# Patient Record
Sex: Female | Born: 1965 | Race: White | Hispanic: No | Marital: Married | Smoking: Never smoker
Health system: Southern US, Community
[De-identification: ages and names within clinical notes are randomized; demographics above are authoritative.]

## PROBLEM LIST (undated history)

## (undated) DIAGNOSIS — G9332 Myalgic encephalomyelitis/chronic fatigue syndrome: Secondary | ICD-10-CM

## (undated) DIAGNOSIS — N289 Disorder of kidney and ureter, unspecified: Secondary | ICD-10-CM

## (undated) DIAGNOSIS — G43909 Migraine, unspecified, not intractable, without status migrainosus: Secondary | ICD-10-CM

## (undated) DIAGNOSIS — R002 Palpitations: Secondary | ICD-10-CM

## (undated) DIAGNOSIS — R5382 Chronic fatigue, unspecified: Secondary | ICD-10-CM

## (undated) DIAGNOSIS — I10 Essential (primary) hypertension: Secondary | ICD-10-CM

## (undated) HISTORY — DX: Migraine, unspecified, not intractable, without status migrainosus: G43.909

## (undated) HISTORY — DX: Palpitations: R00.2

## (undated) HISTORY — DX: Disorder of kidney and ureter, unspecified: N28.9

## (undated) HISTORY — PX: TUBAL LIGATION: SHX77

## (undated) HISTORY — PX: OTHER SURGICAL HISTORY: SHX169

## (undated) HISTORY — DX: Chronic fatigue, unspecified: R53.82

## (undated) HISTORY — DX: Essential (primary) hypertension: I10

## (undated) HISTORY — DX: Myalgic encephalomyelitis/chronic fatigue syndrome: G93.32

---

## 2005-01-09 ENCOUNTER — Ambulatory Visit: Payer: Self-pay | Admitting: Family Medicine

## 2005-01-12 ENCOUNTER — Ambulatory Visit: Payer: Self-pay | Admitting: Family Medicine

## 2005-01-31 ENCOUNTER — Ambulatory Visit: Payer: Self-pay | Admitting: Family Medicine

## 2005-03-01 ENCOUNTER — Ambulatory Visit: Payer: Self-pay | Admitting: Family Medicine

## 2005-03-21 ENCOUNTER — Ambulatory Visit: Payer: Self-pay | Admitting: Family Medicine

## 2005-04-12 ENCOUNTER — Ambulatory Visit: Payer: Self-pay | Admitting: Family Medicine

## 2005-05-25 ENCOUNTER — Encounter: Payer: Self-pay | Admitting: Family Medicine

## 2005-06-12 ENCOUNTER — Ambulatory Visit: Payer: Self-pay | Admitting: Family Medicine

## 2005-06-12 ENCOUNTER — Other Ambulatory Visit: Admission: RE | Admit: 2005-06-12 | Discharge: 2005-06-12 | Payer: Self-pay | Admitting: Family Medicine

## 2005-06-12 ENCOUNTER — Encounter: Payer: Self-pay | Admitting: Family Medicine

## 2005-06-12 LAB — CONVERTED CEMR LAB: Pap Smear: NORMAL

## 2005-08-28 ENCOUNTER — Ambulatory Visit: Payer: Self-pay | Admitting: Family Medicine

## 2005-11-30 ENCOUNTER — Ambulatory Visit: Payer: Self-pay | Admitting: Family Medicine

## 2006-01-02 DIAGNOSIS — K589 Irritable bowel syndrome without diarrhea: Secondary | ICD-10-CM

## 2006-01-02 DIAGNOSIS — G4700 Insomnia, unspecified: Secondary | ICD-10-CM

## 2006-01-02 DIAGNOSIS — F339 Major depressive disorder, recurrent, unspecified: Secondary | ICD-10-CM | POA: Insufficient documentation

## 2006-01-02 DIAGNOSIS — G43909 Migraine, unspecified, not intractable, without status migrainosus: Secondary | ICD-10-CM

## 2006-02-06 ENCOUNTER — Encounter: Payer: Self-pay | Admitting: Family Medicine

## 2007-03-13 ENCOUNTER — Ambulatory Visit: Payer: Self-pay | Admitting: Family Medicine

## 2007-03-13 DIAGNOSIS — R259 Unspecified abnormal involuntary movements: Secondary | ICD-10-CM | POA: Insufficient documentation

## 2007-06-18 ENCOUNTER — Ambulatory Visit: Payer: Self-pay | Admitting: Family Medicine

## 2007-09-05 ENCOUNTER — Ambulatory Visit: Payer: Self-pay | Admitting: Family Medicine

## 2007-09-05 DIAGNOSIS — N920 Excessive and frequent menstruation with regular cycle: Secondary | ICD-10-CM

## 2008-01-10 ENCOUNTER — Ambulatory Visit: Payer: Self-pay | Admitting: Family Medicine

## 2008-04-16 ENCOUNTER — Telehealth: Payer: Self-pay | Admitting: Family Medicine

## 2008-11-13 ENCOUNTER — Ambulatory Visit: Payer: Self-pay | Admitting: Family Medicine

## 2008-11-13 DIAGNOSIS — R55 Syncope and collapse: Secondary | ICD-10-CM | POA: Insufficient documentation

## 2008-11-16 LAB — CONVERTED CEMR LAB
ALT: 16 units/L (ref 0–35)
Alkaline Phosphatase: 52 units/L (ref 39–117)
Calcium: 9.6 mg/dL (ref 8.4–10.5)
HCT: 40.5 % (ref 36.0–46.0)
HDL: 56 mg/dL (ref >39)
Iron: 116 ug/dL (ref 42–145)
LDL Cholesterol: 145 mg/dL — ABNORMAL HIGH (ref 0–99)
MCV: 95.5 fL (ref 78.0–100.0)
Potassium: 4.6 meq/L (ref 3.5–5.3)
RBC: 4.24 M/uL (ref 3.87–5.11)
Saturation Ratios: 32 % (ref 20–55)
TIBC: 368 ug/dL (ref 250–470)
Total Protein: 7.1 g/dL (ref 6.0–8.3)
Triglycerides: 97 mg/dL (ref <150)
UIBC: 252 ug/dL
VLDL: 19 mg/dL (ref 0–40)
WBC: 5.2 10*3/uL (ref 4.0–10.5)

## 2008-11-20 ENCOUNTER — Ambulatory Visit: Payer: Self-pay | Admitting: Family Medicine

## 2008-11-20 DIAGNOSIS — E162 Hypoglycemia, unspecified: Secondary | ICD-10-CM | POA: Insufficient documentation

## 2008-12-11 ENCOUNTER — Ambulatory Visit: Payer: Self-pay | Admitting: Family Medicine

## 2008-12-11 DIAGNOSIS — I10 Essential (primary) hypertension: Secondary | ICD-10-CM | POA: Insufficient documentation

## 2009-01-08 ENCOUNTER — Ambulatory Visit: Payer: Self-pay | Admitting: Family Medicine

## 2009-03-10 ENCOUNTER — Ambulatory Visit: Payer: Self-pay | Admitting: Family Medicine

## 2009-03-10 DIAGNOSIS — M25649 Stiffness of unspecified hand, not elsewhere classified: Secondary | ICD-10-CM

## 2009-08-30 ENCOUNTER — Telehealth: Payer: Self-pay | Admitting: Family Medicine

## 2009-08-31 ENCOUNTER — Ambulatory Visit: Payer: Self-pay | Admitting: Family Medicine

## 2009-08-31 DIAGNOSIS — R079 Chest pain, unspecified: Secondary | ICD-10-CM | POA: Insufficient documentation

## 2009-08-31 DIAGNOSIS — R5381 Other malaise: Secondary | ICD-10-CM

## 2009-08-31 DIAGNOSIS — R5383 Other fatigue: Secondary | ICD-10-CM

## 2009-09-01 ENCOUNTER — Encounter (INDEPENDENT_AMBULATORY_CARE_PROVIDER_SITE_OTHER): Payer: Self-pay | Admitting: *Deleted

## 2009-09-01 LAB — CONVERTED CEMR LAB
Glucose, Bld: 86 mg/dL (ref 70–99)
HCT: 37.3 % (ref 36.0–46.0)
Hemoglobin: 11.9 g/dL — ABNORMAL LOW (ref 12.0–15.0)
MCV: 95.4 fL (ref 78.0–100.0)
Platelets: 247 10*3/uL (ref 150–400)
Potassium: 4.4 meq/L (ref 3.5–5.3)
RBC: 3.91 M/uL (ref 3.87–5.11)
RDW: 13.5 % (ref 11.5–15.5)
Saturation Ratios: 19 % — ABNORMAL LOW (ref 20–55)
Troponin I: <0.01 ng/mL (ref <0.06)

## 2009-09-17 ENCOUNTER — Encounter: Payer: Self-pay | Admitting: Family Medicine

## 2009-09-22 ENCOUNTER — Ambulatory Visit: Payer: Self-pay | Admitting: Family Medicine

## 2009-09-22 DIAGNOSIS — D509 Iron deficiency anemia, unspecified: Secondary | ICD-10-CM

## 2009-09-22 DIAGNOSIS — R002 Palpitations: Secondary | ICD-10-CM | POA: Insufficient documentation

## 2010-01-14 ENCOUNTER — Ambulatory Visit: Payer: Self-pay | Admitting: Family Medicine

## 2010-03-15 ENCOUNTER — Other Ambulatory Visit
Admission: RE | Admit: 2010-03-15 | Discharge: 2010-03-15 | Payer: Self-pay | Source: Home / Self Care | Admitting: Family Medicine

## 2010-03-15 ENCOUNTER — Ambulatory Visit: Payer: Self-pay | Admitting: Family Medicine

## 2010-03-15 ENCOUNTER — Encounter: Payer: Self-pay | Admitting: Family Medicine

## 2010-03-15 LAB — CONVERTED CEMR LAB: Pap Smear: NORMAL

## 2010-03-16 LAB — CONVERTED CEMR LAB
Cholesterol: 214 mg/dL — ABNORMAL HIGH (ref 0–200)
HDL: 58 mg/dL (ref >39)
Total CHOL/HDL Ratio: 3.7
VLDL: 17 mg/dL (ref 0–40)

## 2010-03-17 LAB — CONVERTED CEMR LAB: Pap Smear: NEGATIVE

## 2010-03-23 ENCOUNTER — Encounter
Admission: RE | Admit: 2010-03-23 | Discharge: 2010-03-23 | Payer: Self-pay | Source: Home / Self Care | Attending: Family Medicine | Admitting: Family Medicine

## 2010-04-06 ENCOUNTER — Ambulatory Visit
Admission: RE | Admit: 2010-04-06 | Discharge: 2010-04-06 | Payer: Self-pay | Source: Home / Self Care | Attending: Family Medicine | Admitting: Family Medicine

## 2010-04-06 ENCOUNTER — Encounter
Admission: RE | Admit: 2010-04-06 | Discharge: 2010-04-06 | Payer: Self-pay | Source: Home / Self Care | Attending: Family Medicine | Admitting: Family Medicine

## 2010-04-06 DIAGNOSIS — R310 Gross hematuria: Secondary | ICD-10-CM | POA: Insufficient documentation

## 2010-04-06 DIAGNOSIS — B9789 Other viral agents as the cause of diseases classified elsewhere: Secondary | ICD-10-CM | POA: Insufficient documentation

## 2010-04-06 LAB — CONVERTED CEMR LAB
Bilirubin Urine: NEGATIVE
Glucose, Urine, Semiquant: NEGATIVE
Ketones, urine, test strip: NEGATIVE
pH: 5.5

## 2010-04-07 ENCOUNTER — Encounter: Payer: Self-pay | Admitting: Family Medicine

## 2010-04-11 ENCOUNTER — Encounter (INDEPENDENT_AMBULATORY_CARE_PROVIDER_SITE_OTHER): Payer: Self-pay | Admitting: *Deleted

## 2010-04-26 NOTE — Letter (Signed)
Summary: Consult Scheduled [facility]  [address]   [place], [place] [address]   Phone: [phone]  Fax: [phone]    09/01/2009 MRN: [id number]    Dear Ms. Morison,       We have scheduled an appointment for you.  At the recommendation of Dr.Bowen, we have scheduled you a consult with [facility] -Dr. Ruthann Cancer on 09/17/09 at 10:00.  Their phone number is [phone].  If this appointment day and time is not convenient for you, please feel free to call the office of the doctor you are being referred to at the number listed above and reschedule the appointment.     Thank you,  Michaelle Copas [facility] [name] [phone]

## 2010-04-26 NOTE — Progress Notes (Signed)
Summary: Chest discomfort  Phone Note Call from Patient   Caller: Patient Summary of Call: Pt c/o chest discomfort and some palpitations x 4 days but has experienced this before. Pt started w/ some pains in arms over the weekend but is better today. Pt scheduled tomorrow for 3:15 but agrees to go to ED if Sxs worsen.  Initial call taken by: Payton Spark CMA,  August 30, 2009 8:35 AM  Follow-up for Phone Call        OK. Follow-up by: Seymour Bars DO,  August 30, 2009 8:45 AM

## 2010-04-26 NOTE — Assessment & Plan Note (Signed)
Summary: f/u palpitations   Vital Signs:  Patient profile:   45 year old female Height:      65.25 inches Weight:      139 pounds BMI:     23.04 O2 Sat:      99 % on Room air Pulse rate:   63 / minute BP sitting:   117 / 75  (left arm) Cuff size:   regular  Vitals Entered By: Payton Spark CMA (September 22, 2009 10:24 AM)  O2 Flow:  Room air CC: F/U fatigue. Feeling better.    Primary Care Provider:  Seymour Bars DO  CC:  F/U fatigue. Feeling better. Marland Kitchen  History of Present Illness: 45 yo WF presents for f/u fatigue.  She had a visit with Dr Ruthann Cancer and had a normal stress test.  She is still having heart palpitations.  She is on Metoprolol which may help with her palpitations, taking 1 hr before bed.  No longer having pains.      She is still having fatigue.  She is trying to rest and does not feel depressed or anxious.  She is still having some low sugars even though she tries to eat q 3 hrs.    Current Medications (verified): 1)  Multivitamins  Tabs (Multiple Vitamin) .... Daily 2)  Relpax 20 Mg  Tabs (Eletriptan Hydrobromide) .Marland Kitchen.. 1 Tab By Mouth X 1 As Needed Migraine, Repeat in 2 Hr If Needed 3)  Promethazine Hcl 12.5 Mg  Supp (Promethazine Hcl) .Marland Kitchen.. 1 Pr Q 6 Hrs As Needed Nausea 4)  Metoprolol Succinate 25 Mg Xr24h-Tab (Metoprolol Succinate) .Marland Kitchen.. 1 Tab By Mouth Once Daily  Allergies (verified): No Known Drug Allergies  Review of Systems  The patient denies chest pain and dyspnea on exertion.    Physical Exam  General:  alert, well-developed, well-nourished, and well-hydrated.   Head:  normocephalic and atraumatic.   Mouth:  pharynx pink and moist.   Neck:  no masses.   Lungs:  Normal respiratory effort, chest expands symmetrically. Lungs are clear to auscultation, no crackles or wheezes. Heart:  sinus arrythmia with reg rate.  NO Murmur. Extremities:  no LE edema Neurologic:  slight resting hand tremor Skin:  color normal.   slight conjunctival pallor Psych:   good eye contact and flat affect.     Impression & Recommendations:  Problem # 1:  PALPITATIONS (ICD-785.1) She is reassured by normal stress test with Dr Ruthann Cancer.  She is still having palpitations at night w/o caffeine use and denies feeling anxious.  Will change her metoprolol to Inderal to see if this works better.  I will review an Echo if this was done to eval for MVP. Her updated medication list for this problem includes:    Inderal La 80 Mg Xr24h-cap (Propranolol hcl) .Marland Kitchen... 1 capsule by mouth qpm  Problem # 2:  ANEMIA, IRON DEFICIENCY (ICD-280.9) Secondary to menorrhagia. She is now on MVI with iron.  Recheck in 4 mos.  Problem # 3:  ESSENTIAL HYPERTENSION, BENIGN (ICD-401.1) BP has improved.   Her updated medication list for this problem includes:    Inderal La 80 Mg Xr24h-cap (Propranolol hcl) .Marland Kitchen... 1 capsule by mouth qpm  Problem # 4:  HYPOGLYCEMIA, UNSPECIFIED (ICD-251.2) Reminded pt to eat protein w/ each meal and snack. Recommend bring small pack of nuts in her purse to eat when she starts feeling bad.  Problem # 5:  FATIGUE (ICD-780.79) Previous dx of chronic fatigue syndrome and dpression. She is still having  chronic fatigue.  This should improve with adequate rest, regular exercise, healthy diet and MVI with iron. She is opposed to starting an anti- depresant.  Complete Medication List: 1)  Multivitamins Tabs (Multiple vitamin) .... Daily 2)  Relpax 20 Mg Tabs (Eletriptan hydrobromide) .Marland Kitchen.. 1 tab by mouth x 1 as needed migraine, repeat in 2 hr if needed 3)  Inderal La 80 Mg Xr24h-cap (Propranolol hcl) .Marland Kitchen.. 1 capsule by mouth qpm  Patient Instructions: 1)  Change Metoprolol to Inderal - take at night. 2)  Call if any problems. 3)  Eat protein with each meal and snack to prevent low sugars. 4)  Return for f/u chronic fatigue/ palpations in 4 mos. Prescriptions: INDERAL LA 80 MG XR24H-CAP (PROPRANOLOL HCL) 1 capsule by mouth qPM  #30 x 2   Entered and Authorized  by:   Seymour Bars DO   Signed by:   Seymour Bars DO on 09/22/2009   Method used:   Electronically to        [facility]  [address] (retail)       [address]       [place], [place]  [address]       Ph: [phone]       Fax: [phone]   RxID:   [id number]

## 2010-04-26 NOTE — Assessment & Plan Note (Signed)
Summary: f/u palpitations/ BP / migraines   Vital Signs:  Patient profile:   45 year old female Height:      65.25 inches Weight:      144 pounds Pulse rate:   64 / minute BP sitting:   109 / 66  (left arm) Cuff size:   regular  Vitals Entered By: Kathlene November LPN (January 14, 2010 8:08 AM) CC: follow-up BP Flu Vaccine Consent Questions     Do you have a history of severe allergic reactions to this vaccine? no    Any prior history of allergic reactions to egg and/or gelatin? no    Do you have a sensitivity to the preservative Thimersol? no    Do you have a past history of Guillan-Barre Syndrome? no    Do you currently have an acute febrile illness? no    Have you ever had a severe reaction to latex? no    Vaccine information given and explained to patient? yes    Are you currently pregnant? no    Lot Number:[id number]   Exp Date:09/24/2010   Site Given  Left Deltoid IM   Primary Care Provider:  Seymour Bars DO  CC:  follow-up BP.  History of Present Illness: 45 yo WF presents for f/u HTN and palpitations.  She was changed from Metoprolol to Propranolol 4 mos ago and it has worked better for her palpitations and migraines.  Fatigue improved some.  Trying to get back to exercise.  Rarely needing to take her Relpax.  Having heavy menses and had a mild iron def anemia at last lab check in June.  Current Medications (verified): 1)  Multivitamins  Tabs (Multiple Vitamin) .... Daily 2)  Relpax 20 Mg  Tabs (Eletriptan Hydrobromide) .Marland Kitchen.. 1 Tab By Mouth X 1 As Needed Migraine, Repeat in 2 Hr If Needed 3)  Inderal La 80 Mg Xr24h-Cap (Propranolol Hcl) .Marland Kitchen.. 1 Capsule By Mouth Qpm  Allergies (verified): No Known Drug Allergies  Comments:  Nurse/Medical Assistant: The patient's medications and allergies were reviewed with the patient and were updated in the Medication and Allergy Lists. Kathlene November LPN (January 14, 2010 8:08 AM)  Past History:  Past Medical History: chronic  fatigue syndrome  G2P2002  hand  tremor since 1999 migraines palpitations.  stress echo normal 08-2009, Dr Ruthann Cancer  Past Surgical History: Reviewed history from 02/06/2006 and no changes required. BTL  CT head normal IVP nml LTCS x2  Social History: Reviewed history from 03/13/2007 and no changes required. Married to [employer].  Has 2 teenage children.  Works at [employer] and likes her job.  Nonsmoker.  Denies ETOH or drugs.  Does not exercise. Works 40 hrs/wk.  Review of Systems      See HPI  Physical Exam  General:  alert, well-developed, well-nourished, and well-hydrated.   Head:  normocephalic and atraumatic.   Mouth:  good dentition and pharynx pink and moist.   Neck:  no masses.   Lungs:  Normal respiratory effort, chest expands symmetrically. Lungs are clear to auscultation, no crackles or wheezes. Heart:  Normal rate and regular rhythm. S1 and S2 normal without gallop, murmur, click, rub or other extra sounds. Extremities:  no LE edema Neurologic:  faint resting bilat hand tremor, unchanged Skin:  color normal.   Cervical Nodes:  No lymphadenopathy noted Psych:  good eye contact, not anxious appearing, and not depressed appearing.     Impression & Recommendations:  Problem # 1:  PALPITATIONS (ICD-785.1) Assessment Improved Improved  with change to Inderal with good BP and HR.   Her updated medication list for this problem includes:    Inderal La 80 Mg Xr24h-cap (Propranolol hcl) .Marland Kitchen... 1 capsule by mouth qpm  Problem # 2:  ESSENTIAL HYPERTENSION, BENIGN (ICD-401.1) Assessment: Improved BP looks great.  Continue current meds.  Labs UTD, just needs FLP at next OV. Her updated medication list for this problem includes:    Inderal La 80 Mg Xr24h-cap (Propranolol hcl) .Marland Kitchen... 1 capsule by mouth qpm  BP today: 109/66 Prior BP: 117/75 (09/22/2009)  Labs Reviewed: K+: 4.4 (08/31/2009) Creat: : 0.89 (08/31/2009)   Chol: 220 (11/13/2008)   HDL: 56 (11/13/2008)    LDL: 145 (11/13/2008)   TG: 97 (11/13/2008)  Problem # 3:  MIGRAINE, UNSPEC., W/O INTRACTABLE MIGRAINE (ICD-346.90) Assessment: Improved Improved with Inderall, saving RElpax for rescue. Her updated medication list for this problem includes:    Relpax 20 Mg Tabs (Eletriptan hydrobromide) .Marland Kitchen... 1 tab by mouth x 1 as needed migraine, repeat in 2 hr if needed    Inderal La 80 Mg Xr24h-cap (Propranolol hcl) .Marland Kitchen... 1 capsule by mouth qpm  Problem # 4:  ANEMIA, IRON DEFICIENCY (ICD-280.9) Secondary to heavy menses.  Overdue for pap.  Schedule in 3 mos. Taking a women's MVI.  Plan to hemoccult at time of pap.   Hgb: 11.9 (08/31/2009)   Hct: 37.3 (08/31/2009)   Platelets: 247 (08/31/2009) RBC: 3.91 (08/31/2009)   RDW: 13.5 (08/31/2009)   WBC: 6.8 (08/31/2009) MCV: 95.4 (08/31/2009)   MCHC: 31.9 (08/31/2009) Iron: 70 (08/31/2009)   TIBC: 359 (08/31/2009)   % Sat: 19 (08/31/2009) B12: 681 (08/31/2009)   TSH: 1.107 (08/31/2009)  Complete Medication List: 1)  Multivitamins Tabs (Multiple vitamin) .... Daily 2)  Relpax 20 Mg Tabs (Eletriptan hydrobromide) .Marland Kitchen.. 1 tab by mouth x 1 as needed migraine, repeat in 2 hr if needed 3)  Inderal La 80 Mg Xr24h-cap (Propranolol hcl) .Marland Kitchen.. 1 capsule by mouth qpm  Other Orders: Admin 1st Vaccine ([address]) Flu Vaccine 39yrs + ([address])  Patient Instructions: 1)  Stay on Inderal.  RFd for another 6 mos. 2)  BP looks great! 3)  Work on regular exercise. 4)  Flu shot today. 5)  Schedule PAP smear/ fasting cholesterol in 3 mos. Prescriptions: INDERAL LA 80 MG XR24H-CAP (PROPRANOLOL HCL) 1 capsule by mouth qPM  #30 Capsule x 6   Entered and Authorized by:   Seymour Bars DO   Signed by:   Seymour Bars DO on 01/14/2010   Method used:   Electronically to        [facility]  [address] (retail)       [address]       [place], [place]  [address]       Ph: [phone]       Fax: [phone]   RxID:   [id number]    Orders Added: 1)  Est. Patient Level IV  [[address]] 2)  Admin 1st Vaccine [90471] 3)  Flu Vaccine 52yrs + [[address]]

## 2010-04-26 NOTE — Consult Note (Signed)
Summary: [facility]  [facility]   Imported By: Lanelle Bal 09/29/2009 09:45:30  _____________________________________________________________________  External Attachment:    Type:   Image     Comment:   External Document

## 2010-04-26 NOTE — Assessment & Plan Note (Signed)
Summary: L sided CP   Vital Signs:  Patient profile:   45 year old female Height:      65.25 inches Weight:      142 pounds BMI:     23.53 O2 Sat:      100 % on Room air Pulse rate:   65 / minute BP sitting:   140 / 82  (left arm) Cuff size:   regular  Vitals Entered By: Payton Spark CMA (August 31, 2009 3:27 PM)  O2 Flow:  Room air CC: L sided chest discomfort and L arm tingling.    Primary Care Provider:  Seymour Bars DO  CC:  L sided chest discomfort and L arm tingling. Marland Kitchen  History of Present Illness: 45 yo WF presents for L sided chest pressure on and off over the last week or so.  It has occured with playing badmitton.  She also gets heart fluttering.  She notices them day and night.  She denies  feeling SOB.  She has chronic fatigue.  The L sided CP radiates to the L shoulder but is more of a tingling than a pain.  No c/o nausea or heartburn.  Denies diaphoresis.  Denies any acute stressors.    She tries to avoid caffeine.  She is a nonsmoker.     Current Medications (verified): 1)  Multivitamins  Tabs (Multiple Vitamin) .... Daily 2)  Relpax 20 Mg  Tabs (Eletriptan Hydrobromide) .Marland Kitchen.. 1 Tab By Mouth X 1 As Needed Migraine, Repeat in 2 Hr If Needed 3)  Promethazine Hcl 12.5 Mg  Supp (Promethazine Hcl) .Marland Kitchen.. 1 Pr Q 6 Hrs As Needed Nausea 4)  Metoprolol Succinate 25 Mg Xr24h-Tab (Metoprolol Succinate) .Marland Kitchen.. 1 Tab By Mouth Once Daily  Allergies (verified): No Known Drug Allergies  Past History:  Past Medical History: Reviewed history from 06/18/2007 and no changes required. chronic fatigue syndrome  G2P2002  hand  tremor since 1999 migraines  Past Surgical History: Reviewed history from 02/06/2006 and no changes required. BTL  CT head normal IVP nml LTCS x2  Family History: Reviewed history from 12/11/2008 and no changes required. 1 brother w/ heart dz, AMI 22s  7 siblings- sister with HTN  father alive- HTN, hx of rheumatic fever  mother died of  emphysema  Social History: Reviewed history from 03/13/2007 and no changes required. Married to [employer].  Has 2 teenage children.  Works at [employer] and likes her job.  Nonsmoker.  Denies ETOH or drugs.  Does not exercise. Works 40 hrs/wk.  Review of Systems      See HPI  Physical Exam  General:  alert, well-developed, well-nourished, and well-hydrated.   Head:  normocephalic and atraumatic.   Mouth:  good dentition and pharynx pink and moist.   Neck:  no masses.   Chest Wall:  no tenderness.   Lungs:  Normal respiratory effort, chest expands symmetrically. Lungs are clear to auscultation, no crackles or wheezes. Heart:  Normal rate and regular rhythm. S1 and S2 normal without gallop, murmur, click, rub or other extra sounds. Abdomen:  soft and non-tender.   Pulses:  2+ radial pulses Extremities:  no LE edema Neurologic:  bilat resting hand tremor Skin:  color normal.   Cervical Nodes:  No lymphadenopathy noted Psych:  good eye contact, not depressed appearing, and slightly anxious.     Impression & Recommendations:  Problem # 1:  CHEST PAIN, LEFT (ICD-786.50) EKG done today shows NSR with sinus arrythmia, normal rate.  She  is exactly the same at the EKG done 10-2008.  No sign of ischemia. Will get labs today to look for anemia, thyroid d/o, electrolyte imbalace + cardiac enzymes though I think the likelihood of true angina is low.  Her only cardiac risk factor is fam hx.  Due to fam hx, will proceed with a stress echo.  This may also help with her c/o fatigue which she is concerned is coming from her heart. Orders: Stress Echo (Stress Echo) T-CBC No Diff ([id number]) T-Basic Metabolic Panel ([id number]) T-CK Total [phone]) T-CK MB Fract ([address]) T-Troponin I ([id number])  Problem # 2:  FATIGUE (ICD-780.79) Hx of chronic fatigue syndrome so this may not be new.  She has menorrhagia so may be iron def.  Will also screen for cardiac causes, vitamin def.   etc. Orders: T-Vitamin D (25-Hydroxy) ([id number]) T-Vitamin B12 (43329-51884) T-TSH (16606-30160)  Complete Medication List: 1)  Multivitamins Tabs (Multiple vitamin) .... Daily 2)  Relpax 20 Mg Tabs (Eletriptan hydrobromide) .Marland Kitchen.. 1 tab by mouth x 1 as needed migraine, repeat in 2 hr if needed 3)  Promethazine Hcl 12.5 Mg Supp (Promethazine hcl) .Marland Kitchen.. 1 pr q 6 hrs as needed nausea 4)  Metoprolol Succinate 25 Mg Xr24h-tab (Metoprolol succinate) .Marland Kitchen.. 1 tab by mouth once daily  Other Orders: T-Iron [id number]) T-Iron Binding Capacity (TIBC) ([address])  Patient Instructions: 1)  Labs today. 2)  Will set up stress echo at [facility]. 3)  Stay on Metoprolol daily for heart palpitations. 4)  F/U with me in 3 wks for fatigue.

## 2010-04-28 NOTE — Letter (Signed)
Summary: Primary Care Consult Scheduled Letter  [facility] [place]  [address], [address]   [place], [place] [address]   Phone: [phone]  Fax: [phone]      04/11/2010 MRN: [id number]  ADEANA GRILLIOT [address] [place], [place]  [address]    Dear Ms. Parkinson,   We have scheduled an appointment for you.  At the recommendation of Dr.Bowen, we have scheduled you a consult with [facility] in [place] on 05-04-10 at 8:30.  Their address is [address], [place], [id number].The office phone number is [phone].  If this appointment day and time is not convenient for you, please feel free to call the office of the doctor you are being referred to at the number listed above and reschedule the appointment.     It is important for you to keep your scheduled appointments. We are here to make sure you are given good patient care.     Thank you, Michaelle Copas  Patient Care Coordinator [facility] [name]

## 2010-04-28 NOTE — Assessment & Plan Note (Signed)
Summary: not feeling well   Vital Signs:  Patient profile:   45 year old female Menstrual status:  regular Height:      65.25 inches Weight:      140 pounds BMI:     23.20 O2 Sat:      98 % on Room air Temp:     98.2 degrees F oral Pulse rate:   62 / minute BP sitting:   120 / 75  (left arm)  Vitals Entered By: Payton Spark CMA (April 06, 2010 12:57 PM)  O2 Flow:  Room air CC: C/O ? UTI and ? pink eye  Vision Screening:Left eye with correction: 20 / 25 Right eye with correction: 20 / 25 Both eyes with correction: 20 / 20        Vision Entered By: Payton Spark CMA (April 06, 2010 1:03 PM)   Primary Care Provider:  Seymour Bars DO  CC:  C/O ? UTI and ? pink eye.  History of Present Illness: 45 yo WF presents for L eye matted shut this morning.  She has felt really tired x 4 days.  She has a scratchy throat.  No rhinorrhea.  No fevers or chills.  Has some joint aches.  Has a mild HA today.  Denies pain with urination but has increased frequency of urination.  No vaginal discharge.  Has some mild suprapubic pressure.  She is doing well on new OCP.  She has some R flank pain.  Denies any blood in the urine.    Has slight L eye blurring.  Does not wear contacts.  Scant goopy drainage.  Has some redness.  Has slight scratch with blinking.    Allergies: No Known Drug Allergies  Past History:  Past Medical History: Reviewed history from 01/14/2010 and no changes required. chronic fatigue syndrome  G2P2002  hand  tremor since 1999 migraines palpitations.  stress echo normal 08-2009, Dr Ruthann Cancer  Past Surgical History: Reviewed history from 02/06/2006 and no changes required. BTL  CT head normal IVP nml LTCS x2  Social History: Reviewed history from 03/15/2010 and no changes required. Married to [employer].  Has 2 grown daughters.  Works at [employer] and likes her job.  Nonsmoker.  Denies ETOH or drugs.  Walking for exercise. Works 40 hrs/wk.  Review  of Systems      See HPI  Physical Exam  General:  alert, well-developed, well-nourished, and well-hydrated.   Head:  normocephalic and atraumatic.   Eyes:  L sclera globablly injected, mild w/o mucous discharge.  minimal upper lid edema.  no watering.  no orbital tenderness.  PERRLA, EOMI Nose:  no nasal discharge.   Mouth:  good dentition and pharynx pink and moist.   Neck:  no masses.   Lungs:  Normal respiratory effort, chest expands symmetrically. Lungs are clear to auscultation, no crackles or wheezes. Heart:  Normal rate and regular rhythm. S1 and S2 normal without gallop, murmur, click, rub or other extra sounds. Abdomen:  no CVAT or suprapubic TTP, soft.  ND.  NABS Skin:  color normal.     Impression & Recommendations:  Problem # 1:  VIRAL INFECTION (ICD-079.99) Viral infection (URI) with conjunctivitis, likley to be viral also. If redness not improved in 72 hrs with use of warm moist compresses, start RX drops.  Avoid contact spread and use supportive care for viral symptoms - rest, hydrate, ibuprofen and call if not improved after 7 days.  Problem # 2:  GROSS HEMATURIA (ICD-599.71) Blood  in urine with vauge flank pain and maybe suprapubic pain, normal exam findings.   Not on period (due in 2 wks).  Sent for cx.  Possible stone.  KUB today.  If not improving as far as symptoms go and cx comes back neg, proceed with urology f/u. Orders: UA Dipstick w/o Micro (automated)  (81003) T-Culture, Urine ([id number]) T-DG ABD 1 View (74[address])  Complete Medication List: 1)  Multivitamins Tabs (Multiple vitamin) .... Daily 2)  Relpax 20 Mg Tabs (Eletriptan hydrobromide) .Marland Kitchen.. 1 tab by mouth x 1 as needed migraine, repeat in 2 hr if needed 3)  Inderal La 80 Mg Xr24h-cap (Propranolol hcl) .Marland Kitchen.. 1 capsule by mouth qpm 4)  Junel Fe 1/20 1-20 Mg-mcg Tabs (Norethin ace-eth estrad-fe) .Marland Kitchen.. 1 tab by mouth daily as directed 5)  Polymyxin B-trimethoprim 10000-0.1 Unit/ml-% Soln (Polymyxin  b-trimethoprim) .Marland Kitchen.. 1 drop in the left eye 4 x a day x 5 days  Patient Instructions: 1)  Xray abdomen today. 2)  Use Polytrim drops  x 4-5  days or until redness clears. 3)  Use warm moist compressed for comfort. 4)  Rest, clear fluids and ibuprofen for aches and pains. 5)  If not feeling better after 7 days, please call. Prescriptions: POLYMYXIN B-TRIMETHOPRIM 10000-0.1 UNIT/ML-% SOLN (POLYMYXIN B-TRIMETHOPRIM) 1 drop in the left eye 4 x a day x 5 days  #1 bottle x 0   Entered and Authorized by:   Seymour Bars DO   Signed by:   Seymour Bars DO on 04/06/2010   Method used:   Electronically to        [facility] (retail)       [address]       [place], [place]  [address]       Ph: [phone]       Fax: [phone]   RxID:   [id number]    Orders Added: 1)  UA Dipstick w/o Micro (automated)  [81003] 2)  T-Culture, Urine [[id number]] 3)  T-DG ABD 1 View [74000] 4)  Est. Patient Level III [34742]    Laboratory Results   Urine Tests    Routine Urinalysis   Color: yellow Appearance: Clear Glucose: negative   (Normal Range: Negative) Bilirubin: negative   (Normal Range: Negative) Ketone: negative   (Normal Range: Negative) Spec. Gravity: 1.010   (Normal Range: 1.003-1.035) Blood: moderate   (Normal Range: Negative) pH: 5.5   (Normal Range: 5.0-8.0) Protein: negative   (Normal Range: Negative) Urobilinogen: 0.2   (Normal Range: 0-1) Nitrite: negative   (Normal Range: Negative) Leukocyte Esterace: negative   (Normal Range: Negative)

## 2010-04-28 NOTE — Assessment & Plan Note (Signed)
Summary: CPE with pap   Vital Signs:  Patient profile:   45 year old female Menstrual status:  regular LMP:     02/23/2010 Height:      65.25 inches Weight:      136 pounds BMI:     22.54 O2 Sat:      100 % on Room air Pulse rate:   60 / minute BP sitting:   124 / 72  (left arm) Cuff size:   regular  Vitals Entered By: Payton Spark CMA (March 15, 2010 9:10 AM)  O2 Flow:  Room air CC: CPE w/ pap LMP (date): 02/23/2010     Menstrual Status regular Enter LMP: 02/23/2010 Last PAP Result Normal   Primary Care Provider:  Seymour Bars DO  CC:  CPE w/ pap.  History of Present Illness: 45 yo WF presents for CPE with pap.  Still having regular periods, fairly heavy and lasting 6-7 days.  She had a BTL for contraception.  She has thought about going on OCPs to regulate her periods.  She is due for her tetanus update. PGM died of colon cancer, diagnosed in her 66s.  Denies fam hx of breast cancer. She is due for fasting labs today.  She had a stress echo that was normal this year (fam hx of premature heart dz).  Denies CP or DOE.  Eats healthy, exercises regularly.  Taking a MVI and Vit D + 3 servings milk/ day.      Current Medications (verified): 1)  Multivitamins  Tabs (Multiple Vitamin) .... Daily 2)  Relpax 20 Mg  Tabs (Eletriptan Hydrobromide) .Marland Kitchen.. 1 Tab By Mouth X 1 As Needed Migraine, Repeat in 2 Hr If Needed 3)  Inderal La 80 Mg Xr24h-Cap (Propranolol Hcl) .Marland Kitchen.. 1 Capsule By Mouth Qpm  Allergies (verified): No Known Drug Allergies  Past History:  Past Medical History: Reviewed history from 01/14/2010 and no changes required. chronic fatigue syndrome  G2P2002  hand  tremor since 1999 migraines palpitations.  stress echo normal 08-2009, Dr Ruthann Cancer  Past Surgical History: Reviewed history from 02/06/2006 and no changes required. BTL  CT head normal IVP nml LTCS x2  Family History: Reviewed history from 08/31/2009 and no changes required. 1  brother w/ heart dz, AMI 58s  7 siblings- sister with HTN  father alive- HTN, hx of rheumatic fever  mother died of emphysema  Social History: Married to [employer].  Has 2 grown daughters.  Works at [employer] and likes her job.  Nonsmoker.  Denies ETOH or drugs.  Walking for exercise. Works 40 hrs/wk.  Review of Systems  The patient denies anorexia, fever, weight loss, weight gain, vision loss, decreased hearing, hoarseness, chest pain, syncope, dyspnea on exertion, peripheral edema, prolonged cough, headaches, hemoptysis, abdominal pain, melena, hematochezia, severe indigestion/heartburn, hematuria, incontinence, genital sores, muscle weakness, suspicious skin lesions, transient blindness, difficulty walking, depression, unusual weight change, abnormal bleeding, enlarged lymph nodes, angioedema, breast masses, and testicular masses.    Physical Exam  General:  alert, well-developed, well-nourished, and well-hydrated.   Head:  normocephalic and atraumatic.   Eyes:  pupils equal, pupils round, and pupils reactive to light.   Ears:  EACs patent; TMs translucent and gray with good cone of light and bony landmarks.  Nose:  no nasal discharge.   Mouth:  good dentition and pharynx pink and moist.   Neck:  no masses.   Breasts:  No mass, nodules, thickening, tenderness, bulging, retraction, inflamation, nipple discharge or skin changes noted.  Lungs:  Normal respiratory effort, chest expands symmetrically. Lungs are clear to auscultation, no crackles or wheezes. Heart:  Normal rate and regular rhythm. S1 and S2 normal without gallop, murmur, click, rub or other extra sounds. Abdomen:  Bowel sounds positive,abdomen soft and non-tender without masses, organomegaly, no AA bruits Genitalia:  Pelvic Exam:        External: normal female genitalia without lesions or masses        Vagina: normal without lesions or masses        Cervix: normal without lesions or masses        Adnexa: normal  bimanual exam without masses or fullness        Uterus: normal by palpation        Pap smear: performed Pulses:  2+ radial pulses and pedal pulses Extremities:  no LE edema Skin:  color normal.   Cervical Nodes:  No lymphadenopathy noted Psych:  good eye contact, not anxious appearing, and not depressed appearing.     Impression & Recommendations:  Problem # 1:  ROUTINE GYNECOLOGICAL EXAMINATION (ICD-V72.31) Thin prep pap smear done.   Keeping healthy checklist for women reviewed. BP and BMI at goal. Start low dose OCPs for period regulation.  Call if any problems. Continue healthy diet, regular exercise. Mammogram ordered.  Fasitng lipids ordered. Tdap today. RTC for f/u in 6 mos.  Complete Medication List: 1)  Multivitamins Tabs (Multiple vitamin) .... Daily 2)  Relpax 20 Mg Tabs (Eletriptan hydrobromide) .Marland Kitchen.. 1 tab by mouth x 1 as needed migraine, repeat in 2 hr if needed 3)  Inderal La 80 Mg Xr24h-cap (Propranolol hcl) .Marland Kitchen.. 1 capsule by mouth qpm 4)  Junel Fe 1/20 1-20 Mg-mcg Tabs (Norethin ace-eth estrad-fe) .Marland Kitchen.. 1 tab by mouth daily as directed  Other Orders: T-Lipid Profile ([id number]) T-Mammography Bilateral Screening (56387) Tdap => 37yrs IM (56433) Admin 1st Vaccine (29518)  Patient Instructions: 1)  Update mammogram. 2)  Update lab today. 3)  Will call you with all results in the next wk. 4)  Start Junel (BCPs) on the SUN after next period begins. 5)  Call if any  problems. 6)  REturn for f/u in 4 mos. Prescriptions: JUNEL FE 1/20 1-20 MG-MCG TABS (NORETHIN ACE-ETH ESTRAD-FE) 1 tab by mouth daily as directed  #1 month pack x 12   Entered and Authorized by:   Seymour Bars DO   Signed by:   Seymour Bars DO on 03/15/2010   Method used:   Electronically to        [facility]  [address] (retail)       [address]       [place], [place]  [address]       Ph: [phone]       Fax: [phone]   RxID:   [id number]    Orders Added: 1)  T-Lipid Profile  [id number] 2)  T-Mammography Bilateral Screening [77057] 3)  Tdap => 8yrs IM [90715] 4)  Admin 1st Vaccine [90471] 5)  Est. Patient age 45-64 [8]   Immunizations Administered:  Tetanus Vaccine:    Vaccine Type: Tdap    Site: left deltoid    Dose: 0.5 ml    Route: IM    Given by: Payton Spark CMA    Exp. Date: 01/14/2012    Lot #: [id number]    VIS given: 02/12/08 version given March 15, 2010.   Immunizations Administered:  Tetanus Vaccine:    Vaccine Type: Tdap    Site: left  deltoid    Dose: 0.5 ml    Route: IM    Given by: Payton Spark CMA    Exp. Date: 01/14/2012    Lot #: [id number]    VIS given: 02/12/08 version given March 15, 2010.

## 2010-06-13 ENCOUNTER — Telehealth: Payer: Self-pay | Admitting: Family Medicine

## 2010-06-13 ENCOUNTER — Encounter (INDEPENDENT_AMBULATORY_CARE_PROVIDER_SITE_OTHER): Payer: Self-pay | Admitting: *Deleted

## 2010-06-23 NOTE — Progress Notes (Signed)
Summary: Pt having weak spells  Phone Note Call from Patient Call back at Home Phone [phone]   Caller: Patient Reason for Call: Talk to Nurse Summary of Call: pt is having weak spells, pls call Initial call taken by: Lannette Donath,  June 13, 2010 8:48 AM  Follow-up for Phone Call        Pt states Fri at work she began to feel tired and weak. She rested all weekend but still feels weak. She states she has no other Sxs except slight nausea and cramping in legs and feet but she has these Sxs from time to time. Pt also states that she has been Dxd w/ chronic fatigue in the past and this feels the same. Pt did not go to work today. Please advise. Follow-up by: Payton Spark CMA,  June 13, 2010 11:34 AM  Additional Follow-up for Phone Call Additional follow up Details #1::        we can write her out of work for today.  Rest, hydrate.  Labs are all up to date.  Call if not feeling better in 72 hrs. Additional Follow-up by: Seymour Bars DO,  June 13, 2010 11:48 AM     Appended Document: Pt having weak spells Pt aware of the above

## 2010-06-23 NOTE — Letter (Signed)
Summary: Out of Work  [facility]  [address], [address]   [place], [place] [address]   Phone: [phone]  Fax: [phone]    June 13, 2010   Employee:  JOVONNE WILTON    To Whom It May Concern:   For Medical reasons, please excuse the above named employee from work for the following dates:  Start:   06/13/2010  End:   06/14/2010  If you need additional information, please feel free to contact our office.         Sincerely,    Seymour Bars DO

## 2010-09-09 ENCOUNTER — Other Ambulatory Visit: Payer: Self-pay | Admitting: Family Medicine

## 2011-03-08 ENCOUNTER — Other Ambulatory Visit: Payer: Self-pay | Admitting: *Deleted

## 2011-03-08 MED ORDER — NORETHIN ACE-ETH ESTRAD-FE 1-20 MG-MCG PO TABS: 1.0000 | ORAL_TABLET | Freq: Every day | ORAL | Status: DC

## 2011-03-08 MED ORDER — PROPRANOLOL HCL ER 80 MG PO CP24: 80.0000 mg | ORAL_CAPSULE | Freq: Every day | ORAL | Status: DC

## 2011-03-09 ENCOUNTER — Encounter: Payer: Self-pay | Admitting: Family Medicine

## 2011-03-14 ENCOUNTER — Ambulatory Visit (INDEPENDENT_AMBULATORY_CARE_PROVIDER_SITE_OTHER): Payer: BC Managed Care – PPO | Admitting: Family Medicine

## 2011-03-14 ENCOUNTER — Encounter: Payer: Self-pay | Admitting: Family Medicine

## 2011-03-14 DIAGNOSIS — R5383 Other fatigue: Secondary | ICD-10-CM

## 2011-03-14 DIAGNOSIS — E785 Hyperlipidemia, unspecified: Secondary | ICD-10-CM

## 2011-03-14 DIAGNOSIS — R030 Elevated blood-pressure reading, without diagnosis of hypertension: Secondary | ICD-10-CM

## 2011-03-14 DIAGNOSIS — J329 Chronic sinusitis, unspecified: Secondary | ICD-10-CM

## 2011-03-14 DIAGNOSIS — R5381 Other malaise: Secondary | ICD-10-CM

## 2011-03-14 DIAGNOSIS — E611 Iron deficiency: Secondary | ICD-10-CM

## 2011-03-14 MED ORDER — AMOXICILLIN-POT CLAVULANATE 875-125 MG PO TABS: 1.0000 | ORAL_TABLET | Freq: Two times a day (BID) | ORAL | Status: AC

## 2011-03-14 MED ORDER — PROPRANOLOL HCL ER 60 MG PO CP24: 60.0000 mg | ORAL_CAPSULE | Freq: Every day | ORAL | Status: DC

## 2011-03-14 NOTE — Patient Instructions (Signed)
Call if not better in one week.  

## 2011-03-14 NOTE — Progress Notes (Signed)
  Subjective:    Patient ID: Carol Diaz, female    DOB: Aug 04, 1965, 45 y.o.   MRN: [id number]  HPI 3 months very fatigued. BP has been low when she is tired. Hx of iron def anemia.On propranol for BP, tremors, and HA. Tremors are worse when she is tired.    Put on OCP a year ago. Helped regulate her cycles until 6 months ago.  Then periods were sometimes longer and then shorter.  Noticing some hot flashes. No sking or hair changes. No weight changes.   Sinus congestion, pain an HA for 16 days.  No fever.  No cough. Mild ST.  No GI sxs.  No meds for sxs. . + post nasal driainage.    Review of Systems     Objective:   Physical Exam  Constitutional: She is oriented to person, place, and time. She appears well-developed and well-nourished.  HENT:  Head: Normocephalic and atraumatic.  Right Ear: External ear normal.  Left Ear: External ear normal.  Nose: Nose normal.  Mouth/Throat: Oropharynx is clear and moist.       TMs and canals are clear. bilat maxillary tenderness. Nares are normal. No swilling of the turbinates.   Eyes: Conjunctivae and EOM are normal. Pupils are equal, round, and reactive to light.  Neck: Neck supple. No thyromegaly present.  Cardiovascular: Normal rate, regular rhythm and normal heart sounds.   Pulmonary/Chest: Effort normal and breath sounds normal. She has no wheezes.  Lymphadenopathy:    She has no cervical adenopathy.  Neurological: She is alert and oriented to person, place, and time.  Skin: Skin is warm and dry.  Psychiatric: She has a normal mood and affect.          Assessment & Plan:  Fatigue - Will dec her propranolol since HA are well controlled and BP is slightly low.  Hx of iron def so will check CBC and iron level as well. Also check TSH.    Sinusitis - sxs x 14 days. Will tx with augmentin. Call if not better in one week. Symptomatic care.   Hyperlipidemia - due for repeat Lipid panel.

## 2011-03-15 LAB — CBC
HCT: 41.5 % (ref 36.0–46.0)
Hemoglobin: 13.5 g/dL (ref 12.0–15.0)
MCHC: 32.5 g/dL (ref 30.0–36.0)
Platelets: 298 10*3/uL (ref 150–400)
RBC: 4.38 MIL/uL (ref 3.87–5.11)
RDW: 12.8 % (ref 11.5–15.5)

## 2011-03-15 LAB — IRON: Iron: 101 ug/dL (ref 42–145)

## 2011-04-13 ENCOUNTER — Telehealth: Payer: Self-pay | Admitting: Family Medicine

## 2011-04-13 NOTE — Telephone Encounter (Signed)
Please call patient. Normal mammogram.  Repeat in 1 year.  

## 2011-04-14 NOTE — Telephone Encounter (Signed)
Pt.notified

## 2011-05-26 ENCOUNTER — Encounter: Payer: Self-pay | Admitting: Family Medicine

## 2011-09-25 ENCOUNTER — Other Ambulatory Visit: Payer: Self-pay | Admitting: Family Medicine

## 2011-09-29 ENCOUNTER — Other Ambulatory Visit: Payer: Self-pay | Admitting: Family Medicine

## 2011-10-10 ENCOUNTER — Other Ambulatory Visit: Payer: Self-pay | Admitting: Family Medicine

## 2011-10-27 ENCOUNTER — Encounter: Payer: Self-pay | Admitting: Family Medicine

## 2011-10-27 ENCOUNTER — Ambulatory Visit (INDEPENDENT_AMBULATORY_CARE_PROVIDER_SITE_OTHER): Payer: BC Managed Care – PPO | Admitting: Family Medicine

## 2011-10-27 VITALS — BP 143/87 | HR 75 | Ht 65.25 in | Wt 142.0 lb

## 2011-10-27 DIAGNOSIS — R5383 Other fatigue: Secondary | ICD-10-CM

## 2011-10-27 DIAGNOSIS — R51 Headache: Secondary | ICD-10-CM

## 2011-10-27 DIAGNOSIS — N920 Excessive and frequent menstruation with regular cycle: Secondary | ICD-10-CM

## 2011-10-27 DIAGNOSIS — R5381 Other malaise: Secondary | ICD-10-CM

## 2011-10-27 DIAGNOSIS — M542 Cervicalgia: Secondary | ICD-10-CM

## 2011-10-27 DIAGNOSIS — I10 Essential (primary) hypertension: Secondary | ICD-10-CM

## 2011-10-27 DIAGNOSIS — M25519 Pain in unspecified shoulder: Secondary | ICD-10-CM

## 2011-10-27 MED ORDER — PROPRANOLOL HCL ER 80 MG PO CP24: 80.0000 mg | ORAL_CAPSULE | Freq: Every day | ORAL | Status: DC

## 2011-10-27 MED ORDER — LEVONORGEST-ETH ESTRAD 91-DAY 0.15-0.03 MG PO TABS: 1.0000 | ORAL_TABLET | Freq: Every day | ORAL | Status: DC

## 2011-10-27 NOTE — Progress Notes (Signed)
Subjective:    Patient ID: Carol Diaz, female    DOB: April 06, 1965, 46 y.o.   MRN: [id number]  HPI HTN - Has been more HA recently, in the back of her head. Radiateing down into her shoulders. Has had a lot of muscle tension. No CP or SOB. Has been more tired. Sleep is good. Has been fatigued. Hx of low iron. + heavy periods. She recently started working again. About 2 weeks ago. 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 She's been having a lot of muscle tension in her neck and shoulders. She has tried taking an ibuprofen a couple times for pain relief. She says her tremor has also gotten worse lately.  Heavy periods-she's also interested in switching to either pseudogout or Seasonique which her daughter takes. She says it would be fantastic cannot have a period every 3 months. She does have a history of iron deficiency anemia.   Review of Systems BP 143/87  Pulse 75  Ht 5' 5.25" (1.657 m)  Wt 142 lb (64.411 kg)  BMI 23.45 kg/m2    No Known Allergies  Past Medical History  Diagnosis Date  . Chronic fatigue syndrome   . Migraines   . Palpitations     Past Surgical History  Procedure Date  . Tubal ligation   . Ivp   . Cesarean section     History   Social History  . Marital Status: Married    Spouse Name: N/A    Number of Children: N/A  . Years of Education: N/A   Occupational History  . Not on file.   Social History Main Topics  . Smoking status: Never Smoker   . Smokeless tobacco: Not on file  . Alcohol Use: No  . Drug Use: No  . Sexually Active:    Other Topics Concern  . Not on file   Social History Narrative  . No narrative on file    Family History  Problem Relation Age of Onset  . Emphysema Mother   . Hypertension Father   . Heart disease Brother 81    mi    Outpatient Encounter Prescriptions as of 10/27/2011  Medication Sig Dispense Refill  . propranolol ER (INDERAL LA) 80 MG 24 hr capsule Take 1 capsule (80 mg total) by mouth  daily.  30 capsule  6  . DISCONTD: JUNEL FE 1/20 1-20 MG-MCG tablet take 1 tablet by mouth once daily  28 tablet  6  . DISCONTD: propranolol ER (INDERAL LA) 60 MG 24 hr capsule take 1 capsule by mouth once daily  30 capsule  0  . levonorgestrel-ethinyl estradiol (SEASONALE,INTROVALE,JOLESSA) 0.15-0.03 MG tablet Take 1 tablet by mouth daily.  1 Package  4  . DISCONTD: JUNEL FE 1/20 1-20 MG-MCG tablet take 1 tablet by mouth once daily  28 tablet  0          Objective:   Physical Exam  Constitutional: She is oriented to person, place, and time. She appears well-developed and well-nourished.  HENT:  Head: Normocephalic and atraumatic.  Cardiovascular: Normal rate, regular rhythm and normal heart sounds.   Pulmonary/Chest: Effort normal and breath sounds normal.  Neurological: She is alert and oriented to person, place, and time.  Skin: Skin is warm and dry.  Psychiatric: She has a normal mood and affect. Her behavior is normal.          Assessment & Plan:  Hypertension-uncontrolled. We will increase her propranolol back to 80  mg. She was previously on this dose so we had decreased to 60 since her blood pressure was actually too low. It is unclear her blood pressure, headaches and her tremor.  Shoulder and neck pain-secondary to starting back to work and sitting at a computer all day. We discussed taking breaks and working on neck stretches. We also discussed using a heating pad and massage as much as possible. She can use anti-inflammatories as needed initially and then wean as she is able. Also discussed the ergonomics of setting her computer so she does not have to stay her down added as this would've less stress and strain on her neck. This is most likely also contributing to her recent increase in headaches.  Menorrhagia-we will switch her to Seasonale which seems to be better covered than Seasonique on her formulary. Hopefully this will help control her periods. She is due for a Pap  smear and physical and I encouraged her to schedule this in about 6 weeks.  Chronic fatigue-her fatigue has increased slightly so we'll check a CBC to rule out anemia since she does have iron deficiency history. We'll also check ferritin. Also check a thyroid level. We will call her with the lab results. In her heavy periods under control may help as well. Also controlling blood pressure may help.

## 2011-10-28 LAB — CBC
MCHC: 33.3 g/dL (ref 30.0–36.0)
RBC: 4.57 MIL/uL (ref 3.87–5.11)
RDW: 13.5 % (ref 11.5–15.5)

## 2011-10-28 LAB — COMPLETE METABOLIC PANEL WITH GFR
AST: 18 U/L (ref 0–37)
Alkaline Phosphatase: 48 U/L (ref 39–117)
CO2: 29 mEq/L (ref 19–32)
Calcium: 10 mg/dL (ref 8.4–10.5)
GFR, Est Non African American: 70 mL/min
Sodium: 140 mEq/L (ref 135–145)
Total Bilirubin: 0.4 mg/dL (ref 0.3–1.2)

## 2011-12-08 ENCOUNTER — Other Ambulatory Visit (HOSPITAL_COMMUNITY)
Admission: RE | Admit: 2011-12-08 | Discharge: 2011-12-08 | Disposition: A | Discharge status: Home / Self Care | Payer: BC Managed Care – PPO | Source: Ambulatory Visit | Attending: Family Medicine | Admitting: Family Medicine

## 2011-12-08 ENCOUNTER — Encounter: Payer: Self-pay | Admitting: Family Medicine

## 2011-12-08 ENCOUNTER — Ambulatory Visit (INDEPENDENT_AMBULATORY_CARE_PROVIDER_SITE_OTHER): Payer: BC Managed Care – PPO | Admitting: Family Medicine

## 2011-12-08 VITALS — BP 132/81 | HR 69 | Ht 65.25 in | Wt 144.0 lb

## 2011-12-08 DIAGNOSIS — Z23 Encounter for immunization: Secondary | ICD-10-CM

## 2011-12-08 DIAGNOSIS — Z01419 Encounter for gynecological examination (general) (routine) without abnormal findings: Secondary | ICD-10-CM

## 2011-12-08 DIAGNOSIS — Z1151 Encounter for screening for human papillomavirus (HPV): Secondary | ICD-10-CM | POA: Insufficient documentation

## 2011-12-08 DIAGNOSIS — Z124 Encounter for screening for malignant neoplasm of cervix: Secondary | ICD-10-CM

## 2011-12-08 DIAGNOSIS — Z298 Encounter for other specified prophylactic measures: Secondary | ICD-10-CM

## 2011-12-08 DIAGNOSIS — F329 Major depressive disorder, single episode, unspecified: Secondary | ICD-10-CM

## 2011-12-08 LAB — LIPID PANEL
HDL: 42 mg/dL (ref >39)
LDL Cholesterol: 187 mg/dL — ABNORMAL HIGH (ref 0–99)
Triglycerides: 128 mg/dL (ref <150)

## 2011-12-08 MED ORDER — FLUOXETINE HCL (PMDD) 10 MG PO CAPS: ORAL_CAPSULE | ORAL | Status: DC

## 2011-12-08 NOTE — Patient Instructions (Addendum)
Start a regular exercise program and make sure you are eating a healthy diet Try to eat 4 servings of dairy a day  Your vaccines are up to date.   
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
## 2011-12-15 ENCOUNTER — Encounter: Payer: Self-pay | Admitting: Family Medicine

## 2011-12-15 ENCOUNTER — Ambulatory Visit (INDEPENDENT_AMBULATORY_CARE_PROVIDER_SITE_OTHER): Payer: BC Managed Care – PPO | Admitting: Family Medicine

## 2011-12-15 VITALS — BP 107/66 | HR 62 | Temp 98.2°F | Wt 143.0 lb

## 2011-12-15 DIAGNOSIS — J069 Acute upper respiratory infection, unspecified: Secondary | ICD-10-CM

## 2011-12-15 DIAGNOSIS — E785 Hyperlipidemia, unspecified: Secondary | ICD-10-CM | POA: Insufficient documentation

## 2011-12-15 MED ORDER — PRAVASTATIN SODIUM 40 MG PO TABS: 40.0000 mg | ORAL_TABLET | Freq: Every day | ORAL | Status: DC

## 2011-12-15 NOTE — Progress Notes (Signed)
  Subjective:    Patient ID: Carol Diaz, female    DOB: 1965/10/08, 46 y.o.   MRN: [id number]  HPI Cough, sinus congestoin, and ST for 2 day.  Some HA.  No fever.  Using some Dayquail cold and Flu.  Feels weak and tired. Breaking out in sweats.  Ears are some itchy. Left work early yesterday adn missed today. No GI sxs.   She would like to address her lipids.   Review of Systems     Objective:   Physical Exam  Constitutional: She is oriented to person, place, and time. She appears well-developed and well-nourished.  HENT:  Head: Normocephalic and atraumatic.  Right Ear: External ear normal.  Left Ear: External ear normal.  Nose: Nose normal.  Mouth/Throat: Oropharynx is clear and moist.       TMs and canals are clear.   Eyes: Conjunctivae normal and EOM are normal. Pupils are equal, round, and reactive to light.  Neck: Neck supple. No thyromegaly present.  Cardiovascular: Normal rate, regular rhythm and normal heart sounds.   Pulmonary/Chest: Effort normal and breath sounds normal. She has no wheezes.  Lymphadenopathy:    She has no cervical adenopathy.  Neurological: She is alert and oriented to person, place, and time.  Skin: Skin is warm and dry.  Psychiatric: She has a normal mood and affect.          Assessment & Plan:  URI - likely viral if this point. She's only been sick for 2 days. Recommend continue over-the-counter symptomatic care. If she starts to run a fever or feels her she's getting worse then please call the office back. Or she's been sick for greater than one week then please let us know. She did have some slight erythema in her throat but nothing to suggest strep throat today.  Hyperlipidemia-she said she did get a phone call about her blood work that was unable to call back. I reviewed those results with her encouraged her to start a cholesterol lowering medication at bedtime. And work on diet and exercise. She says she feels extremely extremely  healthy. She admits and starting at work she has not been able to work out as regularly so she would like to go ahead and start a cholesterol pill. We will send her a prescription to the pharmacy and then recheck her lipids and liver enzymes in 8 weeks.

## 2011-12-15 NOTE — Patient Instructions (Signed)

## 2012-01-05 ENCOUNTER — Encounter: Payer: Self-pay | Admitting: Family Medicine

## 2012-01-05 ENCOUNTER — Ambulatory Visit (INDEPENDENT_AMBULATORY_CARE_PROVIDER_SITE_OTHER): Payer: BC Managed Care – PPO | Admitting: Family Medicine

## 2012-01-05 VITALS — BP 113/68 | HR 64 | Ht 65.0 in | Wt 147.0 lb

## 2012-01-05 DIAGNOSIS — F329 Major depressive disorder, single episode, unspecified: Secondary | ICD-10-CM

## 2012-01-05 DIAGNOSIS — F32A Depression, unspecified: Secondary | ICD-10-CM

## 2012-01-05 DIAGNOSIS — F3289 Other specified depressive episodes: Secondary | ICD-10-CM

## 2012-01-05 DIAGNOSIS — J329 Chronic sinusitis, unspecified: Secondary | ICD-10-CM

## 2012-01-05 DIAGNOSIS — R251 Tremor, unspecified: Secondary | ICD-10-CM

## 2012-01-05 DIAGNOSIS — R259 Unspecified abnormal involuntary movements: Secondary | ICD-10-CM

## 2012-01-05 MED ORDER — AMOXICILLIN 875 MG PO TABS: 875.0000 mg | ORAL_TABLET | Freq: Two times a day (BID) | ORAL | Status: DC

## 2012-01-05 MED ORDER — FLUOXETINE HCL 20 MG PO CAPS: 20.0000 mg | ORAL_CAPSULE | Freq: Every day | ORAL | Status: DC
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
## 2012-02-02 ENCOUNTER — Encounter: Payer: Self-pay | Admitting: Family Medicine

## 2012-02-02 ENCOUNTER — Ambulatory Visit (INDEPENDENT_AMBULATORY_CARE_PROVIDER_SITE_OTHER): Payer: BC Managed Care – PPO | Admitting: Family Medicine

## 2012-02-02 VITALS — BP 109/66 | HR 58 | Ht 64.0 in | Wt 141.0 lb

## 2012-02-02 DIAGNOSIS — G250 Essential tremor: Secondary | ICD-10-CM

## 2012-02-02 DIAGNOSIS — F329 Major depressive disorder, single episode, unspecified: Secondary | ICD-10-CM

## 2012-02-02 MED ORDER — SERTRALINE HCL 50 MG PO TABS: ORAL_TABLET | ORAL | Status: DC

## 2012-02-02 NOTE — Progress Notes (Signed)
  Subjective:    Patient ID: Carol Diaz, female    DOB: [birth year], 46 y.o.   MRN: [id number]  HPI She is here today to followup for depression. She says feel better on the fluoxetine 20 mg. We increased her dose last time to get her to a more therapeutic regimen. Her mood is better but she does feel it is making her tremor worse. She does have a nonphysiologic tremor. She is sleeping better. She's not actively involved in counseling but would like to be in the future after her husband's case goes to court. She is still doing a lot of emotional stress. She says the tremor does make it hard to write and she says which he dislikes most is when other people notice it.   Review of Systems     Objective:   Physical Exam  Constitutional: She is oriented to person, place, and time. She appears well-developed and well-nourished.  HENT:  Head: Normocephalic and atraumatic.  Cardiovascular: Normal rate, regular rhythm and normal heart sounds.   Pulmonary/Chest: Effort normal and breath sounds normal.  Neurological: She is alert and oriented to person, place, and time.  Skin: Skin is warm and dry.  Psychiatric: She has a normal mood and affect. Her behavior is normal.          Assessment & Plan:  Depression- PHQ- 9 score of 7.  I do think the fluoxetine is working well but unfortunately it is making her tremor worse. We discussed the option of keeping with the current medication or trying something else. She opted to try something else. We will wean the fluoxetine and try sertraline which she has never taken before. She has tried Lexapro and citalopram in the past both caused headaches. Followup in 6 weeks. Call if having any significant problems between now and her followup. The week and get her in the counseling sometime in the spring.  Essential tremor-she sis already on propranolol.

## 2012-02-02 NOTE — Patient Instructions (Signed)
Decrease fluoxetine to 10mg  daily for one week, then one every other day for one week, then stop.  Then start new medication.  Will start zoloft in 2 weeks.

## 2012-02-13 ENCOUNTER — Other Ambulatory Visit: Payer: Self-pay | Admitting: Family Medicine

## 2012-03-12 ENCOUNTER — Telehealth: Payer: Self-pay | Admitting: *Deleted

## 2012-03-12 DIAGNOSIS — G43909 Migraine, unspecified, not intractable, without status migrainosus: Secondary | ICD-10-CM

## 2012-03-12 MED ORDER — SUMATRIPTAN SUCCINATE 100 MG PO TABS: 100.0000 mg | ORAL_TABLET | ORAL | Status: DC | PRN

## 2012-03-12 NOTE — Telephone Encounter (Signed)
rx sent for imitrex

## 2012-03-12 NOTE — Telephone Encounter (Signed)
Pt calls and would like a refill on migraine med imitrex- said used this in the past but hasn't had migraine in a while. Looked in chart in EMR and Dr. Cathey Endow gave relpax 20mg  to use for migraines. [facility] states pt filled relpax last on 02/2009.

## 2012-03-15 ENCOUNTER — Encounter: Payer: Self-pay | Admitting: Family Medicine

## 2012-03-15 ENCOUNTER — Ambulatory Visit (INDEPENDENT_AMBULATORY_CARE_PROVIDER_SITE_OTHER): Payer: BC Managed Care – PPO | Admitting: Family Medicine

## 2012-03-15 VITALS — BP 110/58 | HR 60 | Ht 66.0 in | Wt 137.0 lb

## 2012-03-15 DIAGNOSIS — M542 Cervicalgia: Secondary | ICD-10-CM

## 2012-03-15 DIAGNOSIS — F329 Major depressive disorder, single episode, unspecified: Secondary | ICD-10-CM

## 2012-03-15 MED ORDER — CYCLOBENZAPRINE HCL 10 MG PO TABS: 10.0000 mg | ORAL_TABLET | Freq: Every evening | ORAL | Status: DC | PRN

## 2012-03-15 MED ORDER — BUPROPION HCL ER (XL) 150 MG PO TB24: 150.0000 mg | ORAL_TABLET | Freq: Every day | ORAL | Status: DC

## 2012-03-15 NOTE — Progress Notes (Signed)
  Subjective:    Patient ID: Carol Diaz, female    DOB: 1965/07/05, 46 y.o.   MRN: [id number]  HPI Here today to followup on depression-she says overall she feels the sertraline is helpful but she's been getting bad headaches. She says they've been progressively getting worse. She's not been on medication for 3-4 weeks. She denies any other side effects on the medication. She has also had headaches with her Lexapro to citalopram in the past. She did tolerate fluoxetine well but unfortunately it made her essential tremor worse. Mood is fair. She sleeping OK. She has a lot of tension in her neck and upper back but thinks this is most likely from the headaches.   Review of Systems     Objective:   Physical Exam  Constitutional: She is oriented to person, place, and time. She appears well-developed and well-nourished.  HENT:  Head: Normocephalic and atraumatic.  Cardiovascular: Normal rate, regular rhythm and normal heart sounds.   Pulmonary/Chest: Effort normal and breath sounds normal.  Musculoskeletal:       Neck with normal range of motion. She is tender over bilateral trapezius muscles.  Neurological: She is alert and oriented to person, place, and time.  Skin: Skin is warm and dry.  Psychiatric: She has a normal mood and affect. Her behavior is normal.          Assessment & Plan:  Depression-we will discontinue the sertraline and wean off. Will start Wellbutrin. Followup in 5 weeks. Call if any palms or concerns or side effects were for headaches are not resolving.  Neck and upper back pain-most likely secondary to her headaches. Gave her a short prescription of Flexeril to use only at bedtime to help relax the muscles. Also recommend stretches and using a heating pad. Call for headaches are not improving especially as she weans off of the sertraline.

## 2012-03-15 NOTE — Patient Instructions (Signed)
Cut in half for 5 days and then take half every other day for 5 days.  Can start wellbutrin when get to the every other day on the sertraline.

## 2012-04-18 ENCOUNTER — Other Ambulatory Visit: Payer: Self-pay | Admitting: Family Medicine

## 2012-04-18 DIAGNOSIS — F329 Major depressive disorder, single episode, unspecified: Secondary | ICD-10-CM

## 2012-04-19 ENCOUNTER — Ambulatory Visit: Payer: BC Managed Care – PPO | Admitting: Family Medicine

## 2012-05-02 ENCOUNTER — Encounter: Payer: Self-pay | Admitting: Family Medicine

## 2012-05-18 ENCOUNTER — Other Ambulatory Visit: Payer: Self-pay | Admitting: Family Medicine

## 2012-05-28 ENCOUNTER — Other Ambulatory Visit: Payer: Self-pay | Admitting: *Deleted

## 2012-05-28 MED ORDER — PROPRANOLOL HCL ER 80 MG PO CP24: 80.0000 mg | ORAL_CAPSULE | Freq: Every day | ORAL | Status: DC

## 2012-06-14 ENCOUNTER — Ambulatory Visit (INDEPENDENT_AMBULATORY_CARE_PROVIDER_SITE_OTHER): Payer: BC Managed Care – PPO | Admitting: Family Medicine

## 2012-06-14 ENCOUNTER — Encounter: Payer: Self-pay | Admitting: Family Medicine

## 2012-06-14 VITALS — BP 118/70 | HR 62 | Ht 66.0 in | Wt 135.0 lb

## 2012-06-14 DIAGNOSIS — G4700 Insomnia, unspecified: Secondary | ICD-10-CM

## 2012-06-14 DIAGNOSIS — F329 Major depressive disorder, single episode, unspecified: Secondary | ICD-10-CM

## 2012-06-14 DIAGNOSIS — F32A Depression, unspecified: Secondary | ICD-10-CM

## 2012-06-14 MED ORDER — SUMATRIPTAN SUCCINATE 100 MG PO TABS: 100.0000 mg | ORAL_TABLET | ORAL | Status: DC | PRN

## 2012-06-14 MED ORDER — CYCLOBENZAPRINE HCL 10 MG PO TABS: 10.0000 mg | ORAL_TABLET | Freq: Every evening | ORAL | Status: DC | PRN

## 2012-06-14 MED ORDER — BUPROPION HCL ER (XL) 300 MG PO TB24: 300.0000 mg | ORAL_TABLET | Freq: Every day | ORAL | Status: DC

## 2012-06-14 MED ORDER — PRAVASTATIN SODIUM 40 MG PO TABS: 40.0000 mg | ORAL_TABLET | Freq: Every day | ORAL | Status: DC

## 2012-06-14 MED ORDER — PROPRANOLOL HCL ER 80 MG PO CP24: 80.0000 mg | ORAL_CAPSULE | Freq: Every day | ORAL | Status: DC

## 2012-06-14 MED ORDER — BUPROPION HCL ER (XL) 150 MG PO TB24: 150.0000 mg | ORAL_TABLET | Freq: Every day | ORAL | Status: DC

## 2012-06-14 MED ORDER — LEVONORGEST-ETH ESTRAD 91-DAY 0.15-0.03 MG PO TABS: 1.0000 | ORAL_TABLET | Freq: Every day | ORAL | Status: DC

## 2012-06-14 NOTE — Progress Notes (Signed)
  Subjective:    Patient ID: Carol Diaz, female    DOB: Aug 07, 1965, 47 y.o.   MRN: [id number]  HPI Depression - Feeling great on the Wellbutrin.  Happy with current regimen. Denies any S.E.    Insomnia - Goes to bed at 10PM. Wakes up around 3AM ( 5 hours later) and then has hard time falling asleep but then has to get up at 6AM.  She is feeling more exahusted. Yawning at work. Does occ drink caffeine after lunch. No TV at bedtime.    Review of Systems     Objective:   Physical Exam  Constitutional: She is oriented to person, place, and time. She appears well-developed and well-nourished.  HENT:  Head: Normocephalic and atraumatic.  Cardiovascular: Normal rate, regular rhythm and normal heart sounds.   Pulmonary/Chest: Effort normal and breath sounds normal.  Neurological: She is alert and oriented to person, place, and time.  Skin: Skin is warm and dry.  Psychiatric: She has a normal mood and affect. Her behavior is normal.          Assessment & Plan:  Depression - currently well controlled on the Wellbutrin. She's not expensing any side effects which is great considering we have tried 3 other medications. I think this is a great fit for her. We discussed the option of continuing her current dose since her PHQ 9 score is only 3 today versus increasing her dose. She has a lot of stressful things coming up with her husband's court case in the next 4-6 weeks and so she requests to go up on her dose at least for short period of time. Sent a new prescription to her for 300 mg. We can certainly go back down once things settle down in her life. Or if she has any problems and doesn't feel well on the higher dose we can decrease it back down to 150. Just asked her to call us. Otherwise if doing well then followup in 3 months.  Insomnia - she's not having any difficulty falling asleep and staying asleep. We discussed definitely avoiding caffeine, staying away from decongestants etc. that can  sometimes affect sleep. She does not drink alcohol. Certainly if this becomes more bothersome then we could try a sleep aid. At this point in time I do think this is related to stress because when she does wake up at 3 AM her mind starts racing and she has a very difficult time going back to sleep. I asked her to just call if she feels like she would like to try something such as trazodone. We will start with that because it is non-habit forming and then if not affective could consider something such as Ambien.

## 2012-06-24 ENCOUNTER — Ambulatory Visit: Payer: BC Managed Care – PPO | Admitting: Family Medicine

## 2012-07-16 ENCOUNTER — Other Ambulatory Visit: Payer: Self-pay | Admitting: Family Medicine

## 2012-08-15 ENCOUNTER — Encounter: Payer: Self-pay | Admitting: Family Medicine

## 2012-08-15 ENCOUNTER — Ambulatory Visit (INDEPENDENT_AMBULATORY_CARE_PROVIDER_SITE_OTHER): Payer: BC Managed Care – PPO | Admitting: Family Medicine

## 2012-08-15 VITALS — BP 108/71 | HR 72 | Wt 138.0 lb

## 2012-08-15 DIAGNOSIS — G43909 Migraine, unspecified, not intractable, without status migrainosus: Secondary | ICD-10-CM

## 2012-08-15 DIAGNOSIS — G4700 Insomnia, unspecified: Secondary | ICD-10-CM

## 2012-08-15 MED ORDER — TRAZODONE HCL 50 MG PO TABS: 25.0000 mg | ORAL_TABLET | Freq: Every day | ORAL | Status: DC

## 2012-08-15 NOTE — Progress Notes (Signed)
  Subjective:    Patient ID: Carol Diaz, female    DOB: April 07, 1965, 47 y.o.   MRN: [id number]  HPI Migraines - has missed a couple of days of school.  Her migraines have been worse over the last couple of weeks. She's also been sleeping very poorly. She's been dealing with insomnia problems for several months at this point in time in fact we discussed at last office visit.  Insomnia - she has made some changes and cut back on caffeine. She says typically she can fall asleep pretty quickly because she's exhausted but then will wake up and has difficulty going back to sleep. She's also tried some over-the-counter medications and has not been helpful.   Review of Systems     Objective:   Physical Exam  Constitutional: She appears well-developed and well-nourished.  Skin: Skin is warm and dry.  Psychiatric: She has a normal mood and affect. Her behavior is normal.          Assessment & Plan:  Migraine - discussed that I really think that this is being triggered by her poor sleep quality. She's Re: on propranolol prophylactically. Certainly if we improve her sleep quality but her migraines are not improving as well then we can take another look at her prophylactic therapy. She still has a prescription for Imitrex used when necessary.  Insomnia- will try trazodone. Warned about potential sedation with this medication. Call if any side effects or problems. If she does not tolerate this well then we could consider low-dose Ambien. The trazodone I recommended she start with half a tab and at the most go up to 2. Followup in about 7 weeks and we can touch base and make sure that her migraines and her sleep are improving. Continue to work on good sleep hygiene as well.

## 2012-09-03 ENCOUNTER — Encounter: Payer: Self-pay | Admitting: Family Medicine

## 2012-09-03 ENCOUNTER — Telehealth: Payer: Self-pay | Admitting: Family Medicine

## 2012-09-03 ENCOUNTER — Ambulatory Visit (INDEPENDENT_AMBULATORY_CARE_PROVIDER_SITE_OTHER): Payer: BC Managed Care – PPO | Admitting: Family Medicine

## 2012-09-03 ENCOUNTER — Other Ambulatory Visit: Payer: Self-pay | Admitting: Family Medicine

## 2012-09-03 ENCOUNTER — Ambulatory Visit (INDEPENDENT_AMBULATORY_CARE_PROVIDER_SITE_OTHER): Payer: BC Managed Care – PPO

## 2012-09-03 DIAGNOSIS — R109 Unspecified abdominal pain: Secondary | ICD-10-CM

## 2012-09-03 DIAGNOSIS — K5900 Constipation, unspecified: Secondary | ICD-10-CM

## 2012-09-03 DIAGNOSIS — R110 Nausea: Secondary | ICD-10-CM

## 2012-09-03 LAB — URINALYSIS, MICROSCOPIC ONLY
Casts: NONE SEEN
Crystals: NONE SEEN

## 2012-09-03 LAB — CBC WITH DIFFERENTIAL/PLATELET
Basophils Relative: 0 % (ref 0–1)
Eosinophils Absolute: 0.1 10*3/uL (ref 0.0–0.7)
HCT: 40.5 % (ref 36.0–46.0)
Hemoglobin: 13.9 g/dL (ref 12.0–15.0)
MCH: 30.5 pg (ref 26.0–34.0)
MCHC: 34.3 g/dL (ref 30.0–36.0)
Monocytes Absolute: 0.6 10*3/uL (ref 0.1–1.0)
Monocytes Relative: 8 % (ref 3–12)

## 2012-09-03 LAB — URINALYSIS, ROUTINE W REFLEX MICROSCOPIC
Glucose, UA: NEGATIVE mg/dL
Specific Gravity, Urine: 1.027 (ref 1.005–1.030)
pH: 5.5 (ref 5.0–8.0)

## 2012-09-03 LAB — COMPLETE METABOLIC PANEL WITH GFR
ALT: 20 U/L (ref 0–35)
Alkaline Phosphatase: 33 U/L — ABNORMAL LOW (ref 39–117)
BUN: 19 mg/dL (ref 6–23)
CO2: 26 mEq/L (ref 19–32)
Calcium: 9.2 mg/dL (ref 8.4–10.5)
Glucose, Bld: 78 mg/dL (ref 70–99)
Potassium: 4.2 mEq/L (ref 3.5–5.3)
Total Bilirubin: 0.4 mg/dL (ref 0.3–1.2)
Total Protein: 6.8 g/dL (ref 6.0–8.3)

## 2012-09-03 MED ORDER — ONDANSETRON HCL 4 MG PO TABS: 4.0000 mg | ORAL_TABLET | Freq: Every day | ORAL | Status: DC | PRN

## 2012-09-03 MED ORDER — BISACODYL 10 MG RE SUPP: 10.0000 mg | RECTAL | Status: DC | PRN

## 2012-09-03 NOTE — Telephone Encounter (Signed)
Carol Diaz is going to call the lab to add a urine culture on the sample this morning.  Will you please let [name] know that her blood work was very reassuring in the sense that it's extremely unlikely that she has a intestinal infection or liver inflammation.  There's a chance she might have a UTI and we'll see if her urine culture grows anything out in the next few days.  Her abdominal xray shows that she's quite backed up with stool throughout the entire large colon. She has so much that I'd bet a large component of her pain is due to this.  I'd like her to start a mira-lax cleanse where she uses a capful dissolved in water twice a day until pain improves.  I've also sent in a daily suppository that will help speed things up with bowel movements.  [facility]

## 2012-09-03 NOTE — Telephone Encounter (Signed)
Pt.notified

## 2012-09-03 NOTE — Progress Notes (Signed)
CC: Carol Diaz is a 47 y.o. female is here for Nausea   Subjective: HPI:  Patient complains of 12 days of waxing and waning abdominal pain. Is located diffusely throughout the abdomen mostly near the pelvis. Symptoms are present all hours of the day, do not interfere with sleep. Pain is described as a burning it is nonradiating. It is worse with an empty stomach or soon after eating. It is associated with moderate to severe nausea to the point were she has to leave work. She has vomited twice both were on empty stomach so the morning she denies frank blood or coffee ground emesis at this time. She reports having a bowel movement every 3 days but this is normal for her pain does not change prior during or after the bowel movement. She does not typically have nausea with her IBS.  Accompanied by bitemporal headache waxes and wanes throughout the day mild in severity nothing particularly makes better or worse.  She's tried Pepto-Bismol with no improvement no other interventions as of yet. Symptoms have not coincided with starting tramadol.  She denies fevers, chills, fatigue, recent tick bite, motor or sensory disturbances, chest pain, shortness of breath, cough, blood in stool, poor like stool, dysuria, urinary urgency, vaginal discharge.    Review Of Systems Outlined In HPI  Past Medical History  Diagnosis Date  . Chronic fatigue syndrome   . Migraines   . Palpitations      Family History  Problem Relation Age of Onset  . Emphysema Mother   . Hypertension Father   . Heart disease Brother 59    mi     History  Substance Use Topics  . Smoking status: Never Smoker   . Smokeless tobacco: Not on file  . Alcohol Use: No     Objective: Filed Vitals:   09/03/12 0943  BP: 105/63  Pulse: 76  Temp: 98 F (36.7 C)    General: Alert and Oriented, No Acute Distress HEENT: Pupils equal, round, reactive to light. Conjunctivae clear.  External ears unremarkable, canals clear with intact  TMs with appropriate landmarks.  Middle ear appears open without effusion. Pink inferior turbinates.  Moist mucous membranes, pharynx without inflammation nor lesions.  Neck supple without palpable lymphadenopathy nor abnormal masses. Lungs: Clear to auscultation bilaterally, no wheezing/ronchi/rales.  Comfortable work of breathing. Good air movement. Cardiac: Regular rate and rhythm. Normal S1/S2.  No murmurs, rubs, nor gallops.   Abdomen: Normal bowel sounds, negative Murphy's, no palpable masses, significant right lower quadrant, left lower quadrant, suprapubic discomfort without rebound nor guarding Extremities: No peripheral edema.  Strong peripheral pulses.  Mental Status: No depression, anxiety, nor agitation. Skin: Warm and dry.  Assessment & Plan: Carol Diaz was seen today for nausea.  Diagnoses and associated orders for this visit:  Lower abdominal pain, unspecified laterality - CBC w/Diff - COMPLETE METABOLIC PANEL WITH GFR - Sed Rate (ESR) - DG Abd 2 Views; Future - Ammonia - Urinalysis, Routine w reflex microscopic  Nausea alone - CBC w/Diff - COMPLETE METABOLIC PANEL WITH GFR - Sed Rate (ESR) - DG Abd 2 Views; Future - Ammonia - Urinalysis, Routine w reflex microscopic  Other Orders - ondansetron (ZOFRAN) 4 MG tablet; Take 1-2 tablets (4-8 mg total) by mouth daily as needed for nausea.    Differential ranging from constipation, colitis, diverticulitis, hepatitis, will start addressing differential with stat labs above and abdominal x-ray. Will notify patient of results as results return  Return if symptoms worsen or  fail to improve.

## 2012-09-05 ENCOUNTER — Telehealth: Payer: Self-pay | Admitting: *Deleted

## 2012-09-05 DIAGNOSIS — R109 Unspecified abdominal pain: Secondary | ICD-10-CM

## 2012-09-05 DIAGNOSIS — R110 Nausea: Secondary | ICD-10-CM

## 2012-09-05 MED ORDER — PROMETHAZINE HCL 12.5 MG PO TABS: ORAL_TABLET | ORAL | Status: DC

## 2012-09-05 NOTE — Telephone Encounter (Signed)
Carol Diaz, Will you please see if Carol Diaz can clarify whether or not she's having bowel movements and if so how many since starting the miralax regimen?  Regardless, I've sent an Rx for promethazine to her pharmacy to see if this help with the nausea.  I've also put a GI referral in place just incase she doesn't start to see any improvement.  Continue with miralax twice a day regimen through the weekend.

## 2012-09-05 NOTE — Telephone Encounter (Signed)
Pt calls & states that she still hasn't gotten any relief from the miralax & the suppositories (she's used 3 times).  She also states that she "can't shake" the nausea.  Please advise.

## 2012-09-05 NOTE — Telephone Encounter (Signed)
Left message on pt's vm with complete results and to call back to let us know whether or not she has had any BM

## 2012-09-05 NOTE — Telephone Encounter (Signed)
Pt called back and has not had a BM at all

## 2012-09-05 NOTE — Telephone Encounter (Signed)
Sue Lush, Will you please let Mrs. Enslow know that her urine culture did not show any bacteria.  I still think her discomfort was coming from the amount of stool in her large colon, if she's not feeling significantly better by early next week then I'd encourage her to f/u with her PCP asap.

## 2012-09-06 NOTE — Telephone Encounter (Signed)
Noted  

## 2012-09-16 ENCOUNTER — Ambulatory Visit: Payer: BC Managed Care – PPO | Admitting: Family Medicine

## 2012-10-03 ENCOUNTER — Ambulatory Visit: Payer: BC Managed Care – PPO | Admitting: Family Medicine

## 2012-10-16 ENCOUNTER — Other Ambulatory Visit: Payer: Self-pay | Admitting: Family Medicine

## 2012-10-24 ENCOUNTER — Other Ambulatory Visit: Payer: Self-pay

## 2012-10-24 MED ORDER — PROPRANOLOL HCL ER 80 MG PO CP24: 80.0000 mg | ORAL_CAPSULE | Freq: Every day | ORAL | Status: DC

## 2012-12-02 ENCOUNTER — Ambulatory Visit (INDEPENDENT_AMBULATORY_CARE_PROVIDER_SITE_OTHER): Payer: BC Managed Care – PPO | Admitting: Family Medicine

## 2012-12-02 ENCOUNTER — Encounter: Payer: Self-pay | Admitting: Family Medicine

## 2012-12-02 VITALS — BP 138/80 | HR 75 | Wt 143.0 lb

## 2012-12-02 DIAGNOSIS — G250 Essential tremor: Secondary | ICD-10-CM

## 2012-12-02 DIAGNOSIS — E785 Hyperlipidemia, unspecified: Secondary | ICD-10-CM

## 2012-12-02 DIAGNOSIS — I10 Essential (primary) hypertension: Secondary | ICD-10-CM

## 2012-12-02 DIAGNOSIS — K5900 Constipation, unspecified: Secondary | ICD-10-CM

## 2012-12-02 DIAGNOSIS — G4700 Insomnia, unspecified: Secondary | ICD-10-CM

## 2012-12-02 MED ORDER — PRAVASTATIN SODIUM 40 MG PO TABS: 40.0000 mg | ORAL_TABLET | Freq: Every day | ORAL | Status: DC

## 2012-12-02 MED ORDER — BISACODYL 10 MG RE SUPP: 10.0000 mg | RECTAL | Status: DC | PRN

## 2012-12-02 MED ORDER — CYCLOBENZAPRINE HCL 10 MG PO TABS: ORAL_TABLET | ORAL | Status: DC

## 2012-12-02 MED ORDER — BUPROPION HCL ER (XL) 300 MG PO TB24: ORAL_TABLET | ORAL | Status: DC

## 2012-12-02 MED ORDER — ZOLPIDEM TARTRATE 5 MG PO TABS: 5.0000 mg | ORAL_TABLET | Freq: Every evening | ORAL | Status: DC | PRN

## 2012-12-02 MED ORDER — PROPRANOLOL HCL ER 120 MG PO CP24: 120.0000 mg | ORAL_CAPSULE | Freq: Every day | ORAL | Status: DC

## 2012-12-02 MED ORDER — PROPRANOLOL HCL ER 80 MG PO CP24: 80.0000 mg | ORAL_CAPSULE | Freq: Every day | ORAL | Status: DC

## 2012-12-02 MED ORDER — LEVONORGEST-ETH ESTRAD 91-DAY 0.15-0.03 MG PO TABS: 1.0000 | ORAL_TABLET | Freq: Every day | ORAL | Status: DC

## 2012-12-02 MED ORDER — SUMATRIPTAN SUCCINATE 100 MG PO TABS: 100.0000 mg | ORAL_TABLET | ORAL | Status: DC | PRN

## 2012-12-02 NOTE — Progress Notes (Signed)
  Subjective:    Patient ID: Carol Diaz, female    DOB: Jan 13, 1966, 47 y.o.   MRN: [id number]  HPI Insomnia - she tried trazodone for we can happen so she didn't tolerate it well. She says it made her feel strange in her head her on a sleepy. So she went ahead and stopped it. No grogginess on the medication. She has been having more frequent headaches which she also moved recently and this has been stressful for her.  Acute situational depression-overall she is doing well. No specific complaints except her foot the fatigue, difficulty sleeping. She's taking her Wellbutrin regularly and does need refills today. Needs refills on medication  HTN -  Pt denies chest pain, SOB, dizziness, or heart palpitations.  Taking meds as directed w/o problems.  Denies medication side effects.    Tremor-she says her tremor has been worse but also gets worse when she's been under more stress.  Hyperlipidemia - Dong well on statin.   Review of Systems     Objective:   Physical Exam  Constitutional: She is oriented to person, place, and time. She appears well-developed and well-nourished.  HENT:  Head: Normocephalic and atraumatic.  Cardiovascular: Normal rate, regular rhythm and normal heart sounds.   Pulmonary/Chest: Effort normal and breath sounds normal.  Neurological: She is alert and oriented to person, place, and time.  Skin: Skin is warm and dry.  Psychiatric: She has a normal mood and affect. Her behavior is normal.          Assessment & Plan:  Insomnia- L. trazodone. We'll try Ambien. Did warn about potential for dependency. Recommend start with half a tab to a hotel. Use as needed. Call if any palms or concerns. If she notices any sleepwalking etc. excessive grogginess in the morning and she is to stop the medication and call me. Recommended she take about 30 minutes before bedtime.  Hypertension-uncontrolled. We'll increase propranolol to 20 mg. Call if any side effects such as  lightheadedness, dizziness or fatigue. Otherwise followup in one month to recheck blood pressure.  Acute situational depression-overall doing well. PHQ 9 score of 2 today.  Tremor-will try increasing the propranolol for her blood pressure as well as her headaches and see if this also helps with her tremor.  Hyperlipidemia-due to recheck lipids today.continue statin.

## 2012-12-03 ENCOUNTER — Telehealth: Payer: Self-pay | Admitting: *Deleted

## 2012-12-03 NOTE — Telephone Encounter (Signed)
Prior auth approved for Ambien through [facility] from 11/12/12-12/03/13.

## 2012-12-16 ENCOUNTER — Other Ambulatory Visit: Payer: Self-pay | Admitting: Family Medicine

## 2012-12-30 ENCOUNTER — Ambulatory Visit: Payer: BC Managed Care – PPO | Admitting: Family Medicine

## 2013-01-14 ENCOUNTER — Encounter: Payer: Self-pay | Admitting: Family Medicine

## 2013-01-14 ENCOUNTER — Ambulatory Visit (INDEPENDENT_AMBULATORY_CARE_PROVIDER_SITE_OTHER): Payer: BC Managed Care – PPO | Admitting: Family Medicine

## 2013-01-14 VITALS — BP 125/78 | HR 65 | Wt 143.0 lb

## 2013-01-14 DIAGNOSIS — R5381 Other malaise: Secondary | ICD-10-CM

## 2013-01-14 DIAGNOSIS — I10 Essential (primary) hypertension: Secondary | ICD-10-CM

## 2013-01-14 DIAGNOSIS — IMO0001 Reserved for inherently not codable concepts without codable children: Secondary | ICD-10-CM

## 2013-01-14 DIAGNOSIS — M791 Myalgia, unspecified site: Secondary | ICD-10-CM

## 2013-01-14 DIAGNOSIS — E785 Hyperlipidemia, unspecified: Secondary | ICD-10-CM

## 2013-01-14 LAB — LIPID PANEL
Cholesterol: 225 mg/dL — ABNORMAL HIGH (ref 0–200)
HDL: 46 mg/dL (ref >39)
Total CHOL/HDL Ratio: 4.9 Ratio
VLDL: 22 mg/dL (ref 0–40)

## 2013-01-14 LAB — BASIC METABOLIC PANEL WITH GFR
BUN: 15 mg/dL (ref 6–23)
Calcium: 9.5 mg/dL (ref 8.4–10.5)
Creat: 1.18 mg/dL — ABNORMAL HIGH (ref 0.50–1.10)
GFR, Est African American: 64 mL/min
Glucose, Bld: 73 mg/dL (ref 70–99)

## 2013-01-14 MED ORDER — PROPRANOLOL HCL ER 80 MG PO CP24: 80.0000 mg | ORAL_CAPSULE | Freq: Every day | ORAL | Status: DC

## 2013-01-14 NOTE — Progress Notes (Signed)
Subjective:    Patient ID: Carol Diaz, female    DOB: September 01, 1965, 47 y.o.   MRN: [id number]  HPI HTN- We had inc the propranolol ans she has felt really more tired.  By noon she is exhausted.  Muscle feels sore all the time.    Hyperlipidemia-Has fetl really sore in her muscles. She says she feels it in her upper arms, neck, back, and lower extremities bilaterally. She has been taking her statin regularly.  Insomnia - she did start the Ambien. She's been taking the tablet into thirds. She says it does help her fall asleep but does not help with maintenance. She has not had any visits dreams, morning grogginess, or sleep walking.  Review of Systems BP 125/78  Pulse 65  Wt 143 lb (64.864 kg)  BMI 23.09 kg/m2    Allergies  Allergen Reactions  . Fluoxetine Other (See Comments)    Made her tremor worse   . Lexapro [Escitalopram Oxalate] Other (See Comments)    Headache  . Sertraline Other (See Comments)    Headache     Past Medical History  Diagnosis Date  . Chronic fatigue syndrome   . Migraines   . Palpitations     Past Surgical History  Procedure Laterality Date  . Tubal ligation    . Ivp    . Cesarean section      History   Social History  . Marital Status: Married    Spouse Name: N/A    Number of Children: 2  . Years of Education: N/A   Occupational History  .     Social History Main Topics  . Smoking status: Never Smoker   . Smokeless tobacco: Not on file  . Alcohol Use: No  . Drug Use: No  . Sexual Activity: Yes    Partners: Male   Other Topics Concern  . Not on file   Social History Narrative   Some exercise.     Family History  Problem Relation Age of Onset  . Emphysema Mother   . Hypertension Father   . Heart disease Brother 45    mi    Outpatient Encounter Prescriptions as of 01/14/2013  Medication Sig Dispense Refill  . buPROPion (WELLBUTRIN XL) 300 MG 24 hr tablet take 1 tablet by mouth daily  30 tablet  6  . buPROPion  (WELLBUTRIN XL) 300 MG 24 hr tablet take 1 tablet by mouth daily  30 tablet  4  . levonorgestrel-ethinyl estradiol (SEASONALE,INTROVALE,JOLESSA) 0.15-0.03 MG tablet Take 1 tablet by mouth daily.  1 Package  4  . pravastatin (PRAVACHOL) 40 MG tablet Take 1 tablet (40 mg total) by mouth at bedtime.  30 tablet  11  . propranolol ER (INDERAL LA) 80 MG 24 hr capsule Take 1 capsule (80 mg total) by mouth daily.  30 capsule  1  . SUMAtriptan (IMITREX) 100 MG tablet Take 1 tablet (100 mg total) by mouth every 2 (two) hours as needed for migraine.  10 tablet  2  . zolpidem (AMBIEN) 5 MG tablet Take 1 tablet (5 mg total) by mouth at bedtime as needed for sleep.  30 tablet  0  . [DISCONTINUED] propranolol ER (INDERAL LA) 120 MG 24 hr capsule Take 1 capsule (120 mg total) by mouth daily.  30 capsule  1  . [DISCONTINUED] bisacodyl (MAGIC BULLETS) 10 MG suppository Place 1 suppository (10 mg total) rectally as needed for constipation. Only one a day.  12 suppository  11  . [  DISCONTINUED] cyclobenzaprine (FLEXERIL) 10 MG tablet take 1 tablet by mouth at bedtime AS NEEDED for MUSCLE SPASMS  30 tablet  0   No facility-administered encounter medications on file as of 01/14/2013.          Objective:   Physical Exam  Constitutional: She is oriented to person, place, and time. She appears well-developed and well-nourished.  HENT:  Head: Normocephalic and atraumatic.  Neck: Neck supple. No thyromegaly present.  Cardiovascular: Normal rate, regular rhythm and normal heart sounds.   Pulmonary/Chest: Effort normal and breath sounds normal.  Lymphadenopathy:    She has no cervical adenopathy.  Neurological: She is alert and oriented to person, place, and time.  Skin: Skin is warm and dry.  Psychiatric: She has a normal mood and affect. Her behavior is normal.          Assessment & Plan:  HTN- Go back down on the propanolol. I think increased dose is really causing some significant fatigue. We'll go back  down to 80 mg. We will just need to monitor her blood pressure. Was elevated last time which is why we had increased the dose. Followup in one month. We could consider adding an ACE inhibitor to her regimen if needed. No chest pain or shortness of breath.  Hyperlipidemia - Will check CK. Will hold statin for 2 weeks. We'll see if she feels better at that time. We'll need to consider switching to a different statin if she does feel better off the medication.  Insomnia-encouraged her to try a half of the Ambien since coming into a third is not being effective. Monitor for any side effects and let me know. Call if any concerns or problems. Followup in one month.

## 2013-01-14 NOTE — Patient Instructions (Signed)
Hold your pravastatin until I see you back.

## 2013-01-15 ENCOUNTER — Other Ambulatory Visit: Payer: Self-pay | Admitting: *Deleted

## 2013-01-15 DIAGNOSIS — IMO0001 Reserved for inherently not codable concepts without codable children: Secondary | ICD-10-CM

## 2013-01-21 ENCOUNTER — Telehealth: Payer: Self-pay | Admitting: *Deleted

## 2013-01-21 NOTE — Telephone Encounter (Signed)
It should go for bloodwork first and that way we can make sure everything looks okay before we make any further adjustments to her medications. We could certainly stop the propranolol and try something different if she would like.

## 2013-01-21 NOTE — Telephone Encounter (Signed)
Pt calls today & states that she isn't feeling any better since you decreased her bp med.  She has stopped the statin as you advised as well.  She also wanted you to know that she has not had her blood work drawn yet.

## 2013-01-22 NOTE — Telephone Encounter (Signed)
Pt is going to the lab in the morning.

## 2013-01-23 LAB — CK: Total CK: 102 U/L (ref 7–177)

## 2013-01-24 ENCOUNTER — Other Ambulatory Visit: Payer: Self-pay | Admitting: Family Medicine

## 2013-01-24 MED ORDER — PROPRANOLOL HCL ER 60 MG PO CP24: 60.0000 mg | ORAL_CAPSULE | Freq: Every day | ORAL | Status: DC

## 2013-01-28 ENCOUNTER — Encounter: Payer: Self-pay | Admitting: Family Medicine

## 2013-01-28 ENCOUNTER — Ambulatory Visit (INDEPENDENT_AMBULATORY_CARE_PROVIDER_SITE_OTHER): Payer: BC Managed Care – PPO | Admitting: Family Medicine

## 2013-01-28 VITALS — BP 135/77 | HR 56 | Wt 148.0 lb

## 2013-01-28 DIAGNOSIS — M62838 Other muscle spasm: Secondary | ICD-10-CM

## 2013-01-28 DIAGNOSIS — G43901 Migraine, unspecified, not intractable, with status migrainosus: Secondary | ICD-10-CM

## 2013-01-28 DIAGNOSIS — R52 Pain, unspecified: Secondary | ICD-10-CM

## 2013-01-28 DIAGNOSIS — R5381 Other malaise: Secondary | ICD-10-CM

## 2013-01-28 MED ORDER — PREDNISONE 10 MG PO TABS: ORAL_TABLET | ORAL | Status: DC

## 2013-01-28 MED ORDER — KETOROLAC TROMETHAMINE 60 MG/2ML IM SOLN
60.0000 mg | Freq: Once | INTRAMUSCULAR | Status: AC
  Administered 2013-01-28: 60 mg via INTRAMUSCULAR

## 2013-01-28 MED ORDER — METHYLPREDNISOLONE SODIUM SUCC 125 MG IJ SOLR: 125.0000 mg | Freq: Once | INTRAMUSCULAR | Status: DC

## 2013-01-28 MED ORDER — METHYLPREDNISOLONE SODIUM SUCC 125 MG IJ SOLR
125.0000 mg | Freq: Once | INTRAMUSCULAR | Status: AC
  Administered 2013-01-28: 125 mg via INTRAMUSCULAR

## 2013-01-28 NOTE — Progress Notes (Signed)
Subjective:    Patient ID: Carol Diaz, female    DOB: 1965-10-30, 47 y.o.   MRN: [id number]  HPI  Off statin for 2 weeks and bodyaches are not really better.  We did repeat her CK which was mildly elevated and the repeat was normal.  Having radiating pain in the back of head for 6-7 days.  Migraine medicatoin, imitrex,  helps for about 4-5 hours and then headache starts again. She is having photophobia and phonophobia as well as seeing an aura. Rates her headache at 10 out of 10 today.  She also complains of feeling extremely tired and having body aches, that has been going on for several weeks. Feels extremely exhausted.  She is sleeping better overall. She did stop the Ambien to see if that was it. We also recently increased her propranolol for better control blood pressure and she called saying she was not feeling well and we had decreased the dose back down to 60 mg which was her prior dose. She has not noticed a big difference with the decrease on medication. Periods have been light.  Did have a bad cold for about 5 weeks ago adn then got better for a couple of days and then got another viral infection and hasn't felt well since then. No residual URI sxs.  No rash. No fevers, chills, sweats.  Review of Systems BP 135/77  Pulse 56  Wt 148 lb (67.132 kg)    Allergies  Allergen Reactions  . Fluoxetine Other (See Comments)    Made her tremor worse   . Lexapro [Escitalopram Oxalate] Other (See Comments)    Headache  . Sertraline Other (See Comments)    Headache     Past Medical History  Diagnosis Date  . Chronic fatigue syndrome   . Migraines   . Palpitations     Past Surgical History  Procedure Laterality Date  . Tubal ligation    . Ivp    . Cesarean section      History   Social History  . Marital Status: Married    Spouse Name: N/A    Number of Children: 2  . Years of Education: N/A   Occupational History  .     Social History Main Topics  . Smoking  status: Never Smoker   . Smokeless tobacco: Not on file  . Alcohol Use: No  . Drug Use: No  . Sexual Activity: Yes    Partners: Male   Other Topics Concern  . Not on file   Social History Narrative   Some exercise.     Family History  Problem Relation Age of Onset  . Emphysema Mother   . Hypertension Father   . Heart disease Brother 49    mi    Outpatient Encounter Prescriptions as of 01/28/2013  Medication Sig  . buPROPion (WELLBUTRIN XL) 300 MG 24 hr tablet take 1 tablet by mouth daily  . levonorgestrel-ethinyl estradiol (SEASONALE,INTROVALE,JOLESSA) 0.15-0.03 MG tablet Take 1 tablet by mouth daily.  . pravastatin (PRAVACHOL) 40 MG tablet Take 1 tablet (40 mg total) by mouth at bedtime.  . propranolol ER (INDERAL LA) 60 MG 24 hr capsule Take 1 capsule (60 mg total) by mouth daily.  . SUMAtriptan (IMITREX) 100 MG tablet Take 1 tablet (100 mg total) by mouth every 2 (two) hours as needed for migraine.  Marland Kitchen zolpidem (AMBIEN) 5 MG tablet Take 1 tablet (5 mg total) by mouth at bedtime as needed for sleep.  . predniSONE (  DELTASONE) 10 MG tablet 8 tabs Day 1, 6 for Day 2, 4 Day 3, 2 Day 4, 1 Day 5          Objective:   Physical Exam  Constitutional: She is oriented to person, place, and time. She appears well-developed and well-nourished.  HENT:  Head: Normocephalic and atraumatic.  Right Ear: External ear normal.  Left Ear: External ear normal.  Nose: Nose normal.  Mouth/Throat: Oropharynx is clear and moist.  TMs and canals are clear.   Eyes: Conjunctivae and EOM are normal. Pupils are equal, round, and reactive to light.  Neck: Neck supple. No thyromegaly present.  Cardiovascular: Normal rate, regular rhythm and normal heart sounds.   Pulmonary/Chest: Effort normal and breath sounds normal. She has no wheezes.  Musculoskeletal:  She's tender over the cervical paraspinous muscles and over both trapezius muscles.  Lymphadenopathy:    She has no cervical adenopathy.   Neurological: She is alert and oriented to person, place, and time.  Skin: Skin is warm and dry.  Psychiatric: She has a normal mood and affect.          Assessment & Plan:  Status migrainous-will treat with IM Toradol and Solu-Medrol 100, gram injection. She's driving so we did not give the Phenergan component. She does have suffered at home so can take this as she gets home. We'll also write for 5 day steroid taper to see if this helps break the headache cycle. She can start that tomorrow if she's not getting significant relief when she gets up tomorrow. If that does not help then consider muscle relaxer at bedtime to help with the tension in her neck. In the meantime recommend use a heating pad or rice back on the neck and work on gentle stretches to help reduce the tension in her neck.  Fatigue-unclear etiology. We'll check CBC to rule out anemia or infection as well as a TSH to rule out thyroid disorder. I also think she could definitely have some type of viral etiology. Evidently her symptoms started after she had a cold-type illness approximately 4-5 weeks ago. Will check for CMP as well as Epstein-Barr virus.  Bodyaches-I. think this could be related to possible viral illness. I really think that she will be filling at least a little better being off her statin for 2 weeks if that were the statin causing her symptoms. She can certainly restart the statin anytime.

## 2013-01-29 LAB — CBC WITH DIFFERENTIAL/PLATELET
Basophils Absolute: 0 10*3/uL (ref 0.0–0.1)
Basophils Relative: 0 % (ref 0–1)
Eosinophils Absolute: 0 10*3/uL (ref 0.0–0.7)
Eosinophils Relative: 0 % (ref 0–5)
HCT: 39.1 % (ref 36.0–46.0)
Hemoglobin: 13.2 g/dL (ref 12.0–15.0)
MCH: 30.1 pg (ref 26.0–34.0)
MCHC: 33.8 g/dL (ref 30.0–36.0)
MCV: 89.3 fL (ref 78.0–100.0)
Monocytes Absolute: 0.1 10*3/uL (ref 0.1–1.0)
Monocytes Relative: 1 % — ABNORMAL LOW (ref 3–12)

## 2013-01-29 LAB — COMPLETE METABOLIC PANEL WITH GFR
Alkaline Phosphatase: 45 U/L (ref 39–117)
CO2: 22 mEq/L (ref 19–32)
Calcium: 9.6 mg/dL (ref 8.4–10.5)
Creat: 0.94 mg/dL (ref 0.50–1.10)
GFR, Est African American: 84 mL/min
GFR, Est Non African American: 73 mL/min
Glucose, Bld: 103 mg/dL — ABNORMAL HIGH (ref 70–99)
Sodium: 138 mEq/L (ref 135–145)
Total Bilirubin: 0.5 mg/dL (ref 0.3–1.2)
Total Protein: 6.6 g/dL (ref 6.0–8.3)

## 2013-01-30 ENCOUNTER — Other Ambulatory Visit (INDEPENDENT_AMBULATORY_CARE_PROVIDER_SITE_OTHER): Payer: BC Managed Care – PPO | Admitting: *Deleted

## 2013-01-30 DIAGNOSIS — M549 Dorsalgia, unspecified: Secondary | ICD-10-CM

## 2013-01-31 ENCOUNTER — Other Ambulatory Visit: Payer: Self-pay | Admitting: Family Medicine

## 2013-01-31 ENCOUNTER — Ambulatory Visit: Payer: BC Managed Care – PPO | Admitting: Family Medicine

## 2013-01-31 DIAGNOSIS — M545 Low back pain: Secondary | ICD-10-CM

## 2013-01-31 DIAGNOSIS — R319 Hematuria, unspecified: Secondary | ICD-10-CM

## 2013-01-31 LAB — POCT URINALYSIS DIPSTICK
Glucose, UA: NEGATIVE
Nitrite, UA: NEGATIVE
Protein, UA: NEGATIVE
Spec Grav, UA: 1.025
Urobilinogen, UA: 0.2
pH, UA: 5.5

## 2013-01-31 MED ORDER — CIPROFLOXACIN HCL 500 MG PO TABS: 500.0000 mg | ORAL_TABLET | Freq: Two times a day (BID) | ORAL | Status: AC

## 2013-01-31 NOTE — Progress Notes (Signed)
  Subjective:    Patient ID: Carol Diaz, female    DOB: 1966/02/03, 47 y.o.   MRN: [id number] Pt in for a nurse visit for UA.  Donne Anon, CMA HPI    Review of Systems     Objective:   Physical Exam        Assessment & Plan:  Patient has not felt well and has been extremely fatigued. She had complained of a little bit of low back pain her white count was just slightly elevated. I asked her to come in to get a urinalysis sample. It was positive for moderate blood. We have sent it for culture over the weekend. I will go ahead and place on Cipro the weekend and see if she responds. Nani Gasser, MD

## 2013-02-02 LAB — URINE CULTURE

## 2013-02-14 ENCOUNTER — Ambulatory Visit (INDEPENDENT_AMBULATORY_CARE_PROVIDER_SITE_OTHER): Payer: BC Managed Care – PPO | Admitting: Family Medicine

## 2013-02-14 ENCOUNTER — Encounter: Payer: Self-pay | Admitting: Family Medicine

## 2013-02-14 VITALS — BP 119/72 | HR 80 | Wt 146.0 lb

## 2013-02-14 DIAGNOSIS — F329 Major depressive disorder, single episode, unspecified: Secondary | ICD-10-CM

## 2013-02-14 DIAGNOSIS — M62838 Other muscle spasm: Secondary | ICD-10-CM

## 2013-02-14 DIAGNOSIS — F3289 Other specified depressive episodes: Secondary | ICD-10-CM

## 2013-02-14 DIAGNOSIS — R319 Hematuria, unspecified: Secondary | ICD-10-CM

## 2013-02-14 DIAGNOSIS — I10 Essential (primary) hypertension: Secondary | ICD-10-CM

## 2013-02-14 LAB — URINALYSIS, MICROSCOPIC ONLY: Crystals: NONE SEEN

## 2013-02-14 LAB — POCT URINALYSIS DIPSTICK
Glucose, UA: NEGATIVE
Ketones, UA: NEGATIVE
Leukocytes, UA: NEGATIVE
Protein, UA: NEGATIVE
Spec Grav, UA: >=1.03
Urobilinogen, UA: 0.2

## 2013-02-14 MED ORDER — BUPROPION HCL ER (XL) 150 MG PO TB24: ORAL_TABLET | ORAL | Status: DC

## 2013-02-14 MED ORDER — DULOXETINE HCL 30 MG PO CPEP: 30.0000 mg | ORAL_CAPSULE | Freq: Every day | ORAL | Status: DC

## 2013-02-14 MED ORDER — CYCLOBENZAPRINE HCL 10 MG PO TABS: 10.0000 mg | ORAL_TABLET | Freq: Two times a day (BID) | ORAL | Status: DC | PRN

## 2013-02-14 MED ORDER — SUMATRIPTAN SUCCINATE 100 MG PO TABS: 100.0000 mg | ORAL_TABLET | ORAL | Status: DC | PRN

## 2013-02-14 NOTE — Progress Notes (Signed)
  Subjective:    Patient ID: Carol Diaz, female    DOB: Jan 16, 1966, 47 y.o.   MRN: [id number]  HPI HTN-  Pt denies chest pain, SOB, dizziness, or heart palpitations.  Taking meds as directed w/o problems.  Denies medication side effects.    Neck and shoulders are in constant pain.  Says the muscle are really tight. No trauma or injury. She has been stressed. 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 She still been battling with her migraines. She had a slight headache yesterday and did take an Imitrex and it did relieve her headache. She does not have one today. She says her neck is still sore from the time she wakes up until she goes to bed.  Depression-she feels completely overwhelmed. In fact she actually took the rest of this week and next week off from her job and is using vacation time. She spoke in with her boss about it. She is easily tearful and just feels extremely down. She is taking her Wellbutrin to 300 mg regularly. She is taking this week and next week off from work to help reduce her stress.  She hasn't told her husband how she feels bc she doesn't want to worry him. O/W she feels they have a good relationship.   Review of Systems     Objective:   Physical Exam  Constitutional: She is oriented to person, place, and time. She appears well-developed and well-nourished.  Tearful during the conversation today.   HENT:  Head: Normocephalic and atraumatic.  Musculoskeletal:  Neck with normal flexion and extension. Normal rotation right and left but she has significant discomfort with rotation to the left. Normal side bending. Arms with normal range of motion. Strength at the shoulders elbows and wrists is 5 out of 5 bilaterally. I virtually reflexes 1+ bilaterally. She is mildly tender over the cervical paraspinous muscles as well as the upper trapezius muscles.  Neurological: She is alert and oriented to person, place, and time.  Skin: Skin is warm and dry.  Psychiatric: She has a  normal mood and affect. Her behavior is normal.          Assessment & Plan:  HTN- blood pressure looks fantastic today. Continue current regimen. Followup in 6 months.  Cervical strain/trapezius pain-recommend gentle stretches. Also recommend heating pad or heated Rice pack.  Gave her prescription for muscle relaxer to take at bedtime. She can take a second dose in the morning if she's not going to be driving and if she staying at home that day. Will give her a work note for today.  Depression-uncontrolled. We'll wean the Wellbutrin 250 mg. When I see her back we will probably try to get her completely off the medication. Will add Cymbalta 30 mg. I think this would be helpful for her. She's also interested in referral to counseling. Will refer to Serafina Mitchell downstairs.   Hematuria - UA + blood at last ov Tx with cipro. Urine culture was neg. Repeated ua today to eval for blood.  + so will send for urine micro.

## 2013-03-07 ENCOUNTER — Encounter: Payer: Self-pay | Admitting: Family Medicine

## 2013-03-07 ENCOUNTER — Ambulatory Visit (INDEPENDENT_AMBULATORY_CARE_PROVIDER_SITE_OTHER): Payer: BC Managed Care – PPO | Admitting: Family Medicine

## 2013-03-07 VITALS — BP 113/68 | HR 77 | Temp 98.3°F | Wt 150.0 lb

## 2013-03-07 DIAGNOSIS — F329 Major depressive disorder, single episode, unspecified: Secondary | ICD-10-CM

## 2013-03-07 DIAGNOSIS — F3289 Other specified depressive episodes: Secondary | ICD-10-CM

## 2013-03-07 DIAGNOSIS — M549 Dorsalgia, unspecified: Secondary | ICD-10-CM

## 2013-03-07 DIAGNOSIS — M542 Cervicalgia: Secondary | ICD-10-CM

## 2013-03-07 NOTE — Progress Notes (Signed)
   Subjective:    Patient ID: EMILIJA BOHMAN, female    DOB: 08/15/1965, 47 y.o.   MRN: [id number]  HPI Seh is feeling much better overall.  She says the switch to Cymbalta has been much better. We had dec her dose of Wellbutrin as well. She says the muscle tension in her neck and upper shoulders is much improved. Went back to work after thanksgiving. Seh has had increased appetitie.  She is here with her husband today.  Review of Systems     Objective:   Physical Exam  Constitutional: She is oriented to person, place, and time. She appears well-developed and well-nourished.  HENT:  Head: Normocephalic and atraumatic.  Eyes: Conjunctivae and EOM are normal.  Cardiovascular: Normal rate.   Pulmonary/Chest: Effort normal.  Neurological: She is alert and oriented to person, place, and time.  Skin: Skin is dry. No pallor.  Psychiatric: She has a normal mood and affect. Her behavior is normal.          Assessment & Plan:  Depression - doing really well on Cymbalta. Continue current regimen and current dose. We will completely wean the Wellbutrin over the next week. I would like to see her back in 4-6 weeks to see how well she is doing and to adjust her dose at that time if needed.  Neck/upper back pain-much improved. I'm sure if was related to a side effect of the medication or is just better because her mood is better.

## 2013-03-07 NOTE — Patient Instructions (Signed)
Decrease Wellbutrin to one every other day for about 8 days.

## 2013-03-28 ENCOUNTER — Encounter: Payer: Self-pay | Admitting: Family Medicine

## 2013-03-28 ENCOUNTER — Ambulatory Visit (INDEPENDENT_AMBULATORY_CARE_PROVIDER_SITE_OTHER): Payer: BC Managed Care – PPO | Admitting: Family Medicine

## 2013-03-28 VITALS — BP 130/80 | HR 81 | Temp 98.2°F | Wt 151.0 lb

## 2013-03-28 DIAGNOSIS — L723 Sebaceous cyst: Secondary | ICD-10-CM

## 2013-03-28 DIAGNOSIS — J101 Influenza due to other identified influenza virus with other respiratory manifestations: Secondary | ICD-10-CM

## 2013-03-28 DIAGNOSIS — J111 Influenza due to unidentified influenza virus with other respiratory manifestations: Secondary | ICD-10-CM

## 2013-03-28 DIAGNOSIS — R52 Pain, unspecified: Secondary | ICD-10-CM

## 2013-03-28 LAB — POC INFLUENZA A&B (BINAX/QUICKVUE)
INFLUENZA A, POC: POSITIVE
INFLUENZA B, POC: NEGATIVE

## 2013-03-28 NOTE — Progress Notes (Signed)
   Subjective:    Patient ID: Carol Diaz, female    DOB: 1965-06-08, 48 y.o.   MRN: [id number]  HPI Here for removal of 2 sebaceous cysts on the scalp. They have been there for several years.  Sore throat x5 days. She also ran a fever for couple of days. Some chills sweats and fatigue. She's had significant nasal congestion, runny nose and cough. No shortness of breath. Then GI symptoms. She has had some body aches. Her niece who can do this over Christmas was positive for flu 5 days ago.  Review of Systems     Objective:   Physical Exam  Constitutional: She is oriented to person, place, and time. She appears well-developed and well-nourished.  HENT:  Head: Normocephalic and atraumatic.  Right Ear: External ear normal.  Left Ear: External ear normal.  Nose: Nose normal.  Mouth/Throat: Oropharynx is clear and moist.  TMs and canals are clear.   Eyes: Conjunctivae and EOM are normal. Pupils are equal, round, and reactive to light.  Neck: Neck supple. No thyromegaly present.  Cardiovascular: Normal rate, regular rhythm and normal heart sounds.   Pulmonary/Chest: Effort normal and breath sounds normal. She has no wheezes.  Lymphadenopathy:    She has no cervical adenopathy.  Neurological: She is alert and oriented to person, place, and time.  Skin: Skin is warm and dry.  Psychiatric: She has a normal mood and affect.          Assessment & Plan:  Sebaceous cyst - we had previously discussed removal. Patient tolerated procedure well. Told to followup in 7-10 days for suture removal. Handout provided for wound care.  Flu - strain A . She is otherwise healthy and out of the range to be a candidate for Tamiflu. Recommend symptomatic care, hydration, lots of fluids, Motrin Tylenol for fever and pain relief. Please call if having fever past the weekend.  Sebaceous Cyst Excision Procedure Note  Pre-operative Diagnosis: sebaceous cyst  Post-operative Diagnosis:  same  Locations:left scalp   Indications: pain and tenderness  Anesthesia: Lidocaine 1% with epinephrine without added sodium bicarbonate  Procedure Details  History of allergy to iodine: no  Patient informed of the risks (including bleeding and infection) and benefits of the  procedure and Verbal informed consent obtained.  The lesion and surrounding area was given a sterile prep using betadyne and draped in the usual sterile fashion. An incision was made over the cyst, which was dissected free of the surrounding tissue and removed.  The cyst was filled with typical sebaceous material.  The wound was closed with 4-0 Prolene using simple interrupted stitches. Antibiotic ointment and a sterile dressing applied.  The specimen was sent for pathologic examination. The patient tolerated the procedure well.  EBL: 0.5 ml  Findings: seb cyst x 2  Condition: Stable  Complications: none.  Plan: 1. Instructed to keep the wound dry and covered for 24-48h and clean thereafter. 2. Warning signs of infection were reviewed.   3. Recommended that the patient use NSAID and OTC acetaminophen as needed for pain.  4. Return for suture removal in 7 days.

## 2013-03-28 NOTE — Patient Instructions (Addendum)
Influenza A (H1N1) H1N1 formerly called "swine flu" is a new influenza virus causing sickness in people. The H1N1 virus is different from seasonal influenza viruses. However, the H1N1 symptoms are similar to seasonal influenza and it is spread from person to person. You may be at higher risk for serious problems if you have underlying serious medical conditions. The CDC and the World Health Organization are following reported cases around the world. CAUSES   The flu is thought to spread mainly person-to-person through coughing or sneezing of infected people.  A person may become infected by touching something with the virus on it and then touching their mouth or nose. SYMPTOMS   Fever.  Headache.  Tiredness.  Cough.  Sore throat.  Runny or stuffy nose.  Body aches.  Diarrhea and vomiting These symptoms are referred to as "flu-like symptoms." A lot of different illnesses, including the common cold, may have similar symptoms. DIAGNOSIS   There are tests that can tell if you have the H1N1 virus.  Confirmed cases of H1N1 will be reported to the state or local health department.  A doctor's exam may be needed to tell whether you have an infection that is a complication of the flu. HOME CARE INSTRUCTIONS   Stay informed. Visit the CDC website for current recommendations. Visit www.cdc.gov/H1N1flu/. You may also call 1-800-CDC-INFO (1-800-232-4636).  Get help early if you develop any of the above symptoms.  If you are at high risk from complications of the flu, talk to your caregiver as soon as you develop flu-like symptoms. Those at higher risk for complications include:  People 65 years or older.  People with chronic medical conditions.  Pregnant women.  Young children.  Your caregiver may recommend antiviral medicine to help treat the flu.  If you get the flu, get plenty of rest, drink enough water and fluids to keep your urine clear or pale yellow, and avoid using  alcohol or tobacco.  You may take over-the-counter medicine to relieve the symptoms of the flu if your caregiver approves. (Never give aspirin to children or teenagers who have flu-like symptoms, particularly fever). TREATMENT  If you do get sick, antiviral drugs are available. These drugs can make your illness milder and make you feel better faster. Treatment should start soon after illness starts. It is only effective if taken within the first day of becoming ill. Only your caregiver can prescribe antiviral medication.  PREVENTION   Cover your nose and mouth with a tissue or your arm when you cough or sneeze. Throw the tissue away.  Wash your hands often with soap and warm water, especially after you cough or sneeze. Alcohol-based cleaners are also effective against germs.  Avoid touching your eyes, nose or mouth. This is one way germs spread.  Try to avoid contact with sick people. Follow public health advice regarding school closures. Avoid crowds.  Stay home if you get sick. Limit contact with others to keep from infecting them. People infected with the H1N1 virus may be able to infect others anywhere from 1 day before feeling sick to 5-7 days after getting flu symptoms.  An H1N1 vaccine is available to help protect against the virus. In addition to the H1N1 vaccine, you will need to be vaccinated for seasonal influenza. The H1N1 and seasonal vaccines may be given on the same day. The CDC especially recommends the H1N1 vaccine for:  Pregnant women.  People who live with or care for children younger than 6 months of age.    Health care and emergency services personnel.  Persons between the ages of 26 months through 48 years of age.  People from ages 6625 through 3864 years who are at higher risk for H1N1 because of chronic health disorders or immune system problems. FACEMASKS In community and home settings, the use of facemasks and N95 respirators are not normally recommended. In certain  circumstances, a facemask or N95 respirator may be used for persons at increased risk of severe illness from influenza. Your caregiver can give additional recommendations for facemask use. IN CHILDREN, EMERGENCY WARNING SIGNS THAT NEED URGENT MEDICAL CARE:  Fast breathing or trouble breathing.  Bluish skin color.  Not drinking enough fluids.  Not waking up or not interacting normally.  Being so fussy that the child does not want to be held.  Your child has an oral temperature above 102 F (38.9 C), not controlled by medicine.  Your baby is older than 3 months with a rectal temperature of 102 F (38.9 C) or higher.  Your baby is 353 months old or younger with a rectal temperature of 100.4 F (38 C) or higher.  Flu-like symptoms improve but then return with fever and worse cough. IN ADULTS, EMERGENCY WARNING SIGNS THAT NEED URGENT MEDICAL CARE:  Difficulty breathing or shortness of breath.  Pain or pressure in the chest or abdomen.  Sudden dizziness.  Confusion.  Severe or persistent vomiting.  Bluish color.  You have a oral temperature above 102 F (38.9 C), not controlled by medicine.  Flu-like symptoms improve but return with fever and worse cough. SEEK IMMEDIATE MEDICAL CARE IF:  You or someone you know is experiencing any of the above symptoms. When you arrive at the emergency center, report that you think you have the flu. You may be asked to wear a mask and/or sit in a secluded area to protect others from getting sick. MAKE SURE YOU:   Understand these instructions.  Will watch your condition.  Will get help right away if you are not doing well or get worse. Some of this information courtesy of the CDC.  Document Released: 08/30/2007 Document Revised: 06/05/2011 Document Reviewed: 08/30/2007 [facility] Patient Information 2014 [employer], [place].     Suture care:   Keep the wound clean and dry.  If you were given a bandage (dressing), you should change it at  least once a day. Also, change the dressing if it becomes wet or dirty, or as directed by your caregiver.  Wash the wound with soap and water 2 times a day. Rinse the wound off with water to remove all soap. Pat the wound dry with a clean towel.  After cleaning, apply a thin layer of the antibiotic ointment as recommended by your caregiver. This will help prevent infection and keep the dressing from sticking.  You may shower as usual after the first 24 hours. Do not soak the wound in water until the sutures are removed.  Only take over-the-counter or prescription medicines for pain, discomfort, or fever as directed by your caregiver.  Get your sutures or staples removed as directed by your caregiver.

## 2013-03-29 ENCOUNTER — Other Ambulatory Visit: Payer: Self-pay | Admitting: Family Medicine

## 2013-04-04 ENCOUNTER — Ambulatory Visit (INDEPENDENT_AMBULATORY_CARE_PROVIDER_SITE_OTHER): Payer: BC Managed Care – PPO | Admitting: Family Medicine

## 2013-04-04 ENCOUNTER — Encounter: Payer: Self-pay | Admitting: Family Medicine

## 2013-04-04 VITALS — BP 118/65 | HR 84 | Temp 98.0°F | Wt 153.0 lb

## 2013-04-04 DIAGNOSIS — F329 Major depressive disorder, single episode, unspecified: Secondary | ICD-10-CM

## 2013-04-04 DIAGNOSIS — F3289 Other specified depressive episodes: Secondary | ICD-10-CM

## 2013-04-04 DIAGNOSIS — Z4802 Encounter for removal of sutures: Secondary | ICD-10-CM

## 2013-04-04 DIAGNOSIS — J019 Acute sinusitis, unspecified: Secondary | ICD-10-CM

## 2013-04-04 DIAGNOSIS — F32A Depression, unspecified: Secondary | ICD-10-CM

## 2013-04-04 MED ORDER — AMOXICILLIN-POT CLAVULANATE 875-125 MG PO TABS: 1.0000 | ORAL_TABLET | Freq: Two times a day (BID) | ORAL | Status: DC

## 2013-04-04 NOTE — Progress Notes (Signed)
   Subjective:    Patient ID: Carol Diaz, female    DOB: [birth year], 48 y.o.   MRN: [id number]  HPI Wound check-2 sebaceous cyst removed a week ago. Here to have sutures removed. No pain tenderness or drainage. No fevers.  She was also diagnosed with flu a week ago. She said she had fevers up until 2 days ago, on Wednesday. She started feeling a little bit better yesterday. She still has significant sinus congestion and pressure under both eyes as well as a persistent headache. Her cough sore throat runny nose and body aches are much improved.  Depression-overall she is doing better. She has been sick for the last 2 weeks. Her PHQ 9 score is 5 today.   Review of Systems     Objective:   Physical Exam  Constitutional: She is oriented to person, place, and time. She appears well-developed and well-nourished.  HENT:  Head: Normocephalic and atraumatic.  Right Ear: External ear normal.  Left Ear: External ear normal.  Nose: Nose normal.  Mouth/Throat: Oropharynx is clear and moist.  TMs and canals are clear.   Eyes: Conjunctivae and EOM are normal. Pupils are equal, round, and reactive to light.  Neck: Neck supple. No thyromegaly present.  Cardiovascular: Normal rate, regular rhythm and normal heart sounds.   Pulmonary/Chest: Effort normal and breath sounds normal. She has no wheezes.  Lymphadenopathy:    She has no cervical adenopathy.  Neurological: She is alert and oriented to person, place, and time.  Skin: Skin is warm and dry.  Psychiatric: She has a normal mood and affect.          Assessment & Plan:  Sebaceous cyst removal-incisions look fantastic. They're clean dry and intact. Call if any problems. Do not remove the scabs. Let them fall off on her own.  Acute sinusitis-I. think this is secondary to recent influenza. Most likely secondary bacterial infection. Will go ahead and treat with Augmentin. Call if not significantly better in one week. Work note  provided.  Depression-well-controlled PHQ 9 score of 5 today.

## 2013-04-15 ENCOUNTER — Other Ambulatory Visit: Payer: Self-pay | Admitting: Family Medicine

## 2013-05-05 ENCOUNTER — Telehealth: Payer: Self-pay | Admitting: *Deleted

## 2013-05-05 NOTE — Telephone Encounter (Signed)
Pt seen Dr. Linford ArnoldMetheney on 04/04/13 for sinus infection and was given Augmentin for 10 days. She states she is having the same symptoms and would like to know if she can get something else called in.

## 2013-05-06 MED ORDER — DOXYCYCLINE HYCLATE 100 MG PO TABS: ORAL_TABLET | ORAL | Status: AC

## 2013-05-06 NOTE — Telephone Encounter (Signed)
Pt informed.  Misty Ahmad, LPN  

## 2013-05-06 NOTE — Telephone Encounter (Signed)
[facility] [address] [facility] [place]

## 2013-05-14 ENCOUNTER — Ambulatory Visit (HOSPITAL_COMMUNITY): Payer: BC Managed Care – PPO | Admitting: Psychiatry

## 2013-06-03 ENCOUNTER — Encounter: Payer: Self-pay | Admitting: Family Medicine

## 2013-06-03 ENCOUNTER — Other Ambulatory Visit: Payer: Self-pay | Admitting: Family Medicine

## 2013-06-20 ENCOUNTER — Ambulatory Visit (INDEPENDENT_AMBULATORY_CARE_PROVIDER_SITE_OTHER): Payer: BC Managed Care – PPO | Admitting: Family Medicine

## 2013-06-20 ENCOUNTER — Encounter: Payer: Self-pay | Admitting: Family Medicine

## 2013-06-20 VITALS — BP 134/78 | HR 61 | Wt 152.0 lb

## 2013-06-20 DIAGNOSIS — R5383 Other fatigue: Secondary | ICD-10-CM

## 2013-06-20 DIAGNOSIS — IMO0001 Reserved for inherently not codable concepts without codable children: Secondary | ICD-10-CM

## 2013-06-20 DIAGNOSIS — R5381 Other malaise: Secondary | ICD-10-CM

## 2013-06-20 MED ORDER — DULOXETINE HCL 60 MG PO CPEP: ORAL_CAPSULE | ORAL | Status: DC

## 2013-06-20 NOTE — Progress Notes (Signed)
Subjective:    Patient ID: Carol Diaz, female    DOB: 08-16-1965, 48 y.o.   MRN: [id number]  HPI Depression-last saw her in December she was doing really well with Cymbalta. We have decided to wean off the Wellbutrin at that time to see if she can do well to single agent. Muscle tenderness.  Back muscles have been more tender.  Even a 40 minute walk and she feels like can hardly move.  HAve felt worse for the last 2 weeks. Sleep is fair. Feels like getting cramps in her legs and feet.  Has had that for a long time.  Not vegetarian. Mom with hx of pernicious anemia. Still having periods.  Has been spotting for the last 3 months.     Review of Systems  BP 134/78  Pulse 61  Wt 152 lb (68.947 kg)    Allergies  Allergen Reactions  . Fluoxetine Other (See Comments)    Made her tremor worse   . Lexapro [Escitalopram Oxalate] Other (See Comments)    Headache  . Sertraline Other (See Comments)    Headache     Past Medical History  Diagnosis Date  . Chronic fatigue syndrome   . Migraines   . Palpitations     Past Surgical History  Procedure Laterality Date  . Tubal ligation    . Ivp    . Cesarean section      History   Social History  . Marital Status: Married    Spouse Name: N/A    Number of Children: 2  . Years of Education: N/A   Occupational History  .     Social History Main Topics  . Smoking status: Never Smoker   . Smokeless tobacco: Not on file  . Alcohol Use: No  . Drug Use: No  . Sexual Activity: Yes    Partners: Male   Other Topics Concern  . Not on file   Social History Narrative   Some exercise.     Family History  Problem Relation Age of Onset  . Emphysema Mother   . Hypertension Father   . Heart disease Brother 71    mi    Outpatient Encounter Prescriptions as of 06/20/2013  Medication Sig  . cyclobenzaprine (FLEXERIL) 10 MG tablet Take 1 tablet (10 mg total) by mouth 2 (two) times daily as needed for muscle spasms.  . DULoxetine  (CYMBALTA) 60 MG capsule take 1 capsule by mouth once daily  . levonorgestrel-ethinyl estradiol (SEASONALE,INTROVALE,JOLESSA) 0.15-0.03 MG tablet Take 1 tablet by mouth daily.  . pravastatin (PRAVACHOL) 40 MG tablet Take 1 tablet (40 mg total) by mouth at bedtime.  . propranolol ER (INDERAL LA) 60 MG 24 hr capsule take 1 capsule by mouth once daily  . SUMAtriptan (IMITREX) 100 MG tablet Take 1 tablet (100 mg total) by mouth every 2 (two) hours as needed for migraine.  . [DISCONTINUED] amoxicillin-clavulanate (AUGMENTIN) 875-125 MG per tablet Take 1 tablet by mouth 2 (two) times daily.  . [DISCONTINUED] DULoxetine (CYMBALTA) 30 MG capsule take 1 capsule by mouth once daily          Objective:   Physical Exam  Constitutional: She is oriented to person, place, and time. She appears well-developed and well-nourished.  HENT:  Head: Normocephalic and atraumatic.  Neck: Neck supple. No thyromegaly present.  Cardiovascular: Normal rate, regular rhythm and normal heart sounds.   Pulmonary/Chest: Effort normal and breath sounds normal.  Musculoskeletal:       Arms:  Legs: Areas of tenderness on exam marked with red circles  Lymphadenopathy:    She has no cervical adenopathy.  Neurological: She is alert and oriented to person, place, and time.  Skin: Skin is warm and dry.  Psychiatric: She has a normal mood and affect. Her behavior is normal.          Assessment & Plan:  Fatigue-will check for deficiency including iron, B12, folate etc. We'll also recheck thyroid.  Myalgias-strongly suspect fibromyalgia. She recently had muscle enzyme levels checked and they were normal there were not elevated. Interestingly, she said that about 12-13 years ago she was actually diagnosed with fibromyalgia. She's never really is received any specific treatment for this. If blood work is normal then I would like to have her come back so we can focus on possible treatments for fibromyalgia. She really  does meet the criteria and has at least 10 points of tenderness on both sides of the body on the upper and lower chin these. She seems to wax and wane with her symptoms which is typical. And tends to exacerbate her symptoms if she exercises. We can certainly discuss strategies with medication, working on quality of sleep, and working on developing a routine exercise routine which does show a reduction in pain scores in most patients.

## 2013-06-24 LAB — COMPLETE METABOLIC PANEL WITH GFR
ALBUMIN: 3.5 g/dL (ref 3.5–5.2)
ALT: 15 U/L (ref 0–35)
AST: 15 U/L (ref 0–37)
Alkaline Phosphatase: 40 U/L (ref 39–117)
BUN: 13 mg/dL (ref 6–23)
CO2: 24 mEq/L (ref 19–32)
Calcium: 8.8 mg/dL (ref 8.4–10.5)
Chloride: 108 mEq/L (ref 96–112)
Creat: 0.94 mg/dL (ref 0.50–1.10)
GFR, Est African American: 84 mL/min
GFR, Est Non African American: 72 mL/min
Glucose, Bld: 83 mg/dL (ref 70–99)
POTASSIUM: 4.4 meq/L (ref 3.5–5.3)
SODIUM: 139 meq/L (ref 135–145)
TOTAL PROTEIN: 6.1 g/dL (ref 6.0–8.3)
Total Bilirubin: 0.3 mg/dL (ref 0.2–1.2)

## 2013-06-24 LAB — MAGNESIUM: Magnesium: 1.8 mg/dL (ref 1.5–2.5)

## 2013-06-24 LAB — CBC WITH DIFFERENTIAL/PLATELET
Basophils Absolute: 0 10*3/uL (ref 0.0–0.1)
Basophils Relative: 0 % (ref 0–1)
EOS ABS: 0.2 10*3/uL (ref 0.0–0.7)
Eosinophils Relative: 4 % (ref 0–5)
HEMATOCRIT: 40.3 % (ref 36.0–46.0)
HEMOGLOBIN: 13.1 g/dL (ref 12.0–15.0)
LYMPHS ABS: 1.9 10*3/uL (ref 0.7–4.0)
Lymphocytes Relative: 36 % (ref 12–46)
MCH: 30 pg (ref 26.0–34.0)
MCHC: 32.5 g/dL (ref 30.0–36.0)
MCV: 92.2 fL (ref 78.0–100.0)
MONOS PCT: 9 % (ref 3–12)
Monocytes Absolute: 0.5 10*3/uL (ref 0.1–1.0)
NEUTROS PCT: 51 % (ref 43–77)
Neutro Abs: 2.8 10*3/uL (ref 1.7–7.7)
Platelets: 277 10*3/uL (ref 150–400)
RBC: 4.37 MIL/uL (ref 3.87–5.11)
RDW: 13.4 % (ref 11.5–15.5)
WBC: 5.4 10*3/uL (ref 4.0–10.5)

## 2013-06-24 LAB — TSH: TSH: 2.628 u[IU]/mL (ref 0.350–4.500)

## 2013-06-24 LAB — FERRITIN: Ferritin: 8 ng/mL — ABNORMAL LOW (ref 10–291)

## 2013-06-24 LAB — FOLATE: Folate: >20 ng/mL

## 2013-06-24 LAB — VITAMIN D 25 HYDROXY (VIT D DEFICIENCY, FRACTURES): Vit D, 25-Hydroxy: 40 ng/mL (ref 30–89)

## 2013-06-24 LAB — VITAMIN B12: VITAMIN B 12: 241 pg/mL (ref 211–911)

## 2013-06-26 ENCOUNTER — Ambulatory Visit (INDEPENDENT_AMBULATORY_CARE_PROVIDER_SITE_OTHER): Payer: BC Managed Care – PPO | Admitting: Family Medicine

## 2013-06-26 ENCOUNTER — Encounter: Payer: Self-pay | Admitting: Family Medicine

## 2013-06-26 ENCOUNTER — Ambulatory Visit: Payer: BC Managed Care – PPO | Admitting: Family Medicine

## 2013-06-26 VITALS — BP 145/78 | HR 72 | Ht 66.0 in | Wt 160.0 lb

## 2013-06-26 DIAGNOSIS — M797 Fibromyalgia: Secondary | ICD-10-CM

## 2013-06-26 DIAGNOSIS — G4700 Insomnia, unspecified: Secondary | ICD-10-CM

## 2013-06-26 DIAGNOSIS — IMO0001 Reserved for inherently not codable concepts without codable children: Secondary | ICD-10-CM

## 2013-06-26 MED ORDER — PREGABALIN 150 MG PO CAPS: 150.0000 mg | ORAL_CAPSULE | Freq: Two times a day (BID) | ORAL | Status: DC

## 2013-06-26 MED ORDER — FUSION PLUS PO CAPS: 1.0000 | ORAL_CAPSULE | Freq: Every day | ORAL | Status: DC

## 2013-06-26 MED ORDER — CYCLOBENZAPRINE HCL 5 MG PO TABS: 2.5000 mg | ORAL_TABLET | Freq: Every day | ORAL | Status: DC

## 2013-06-26 NOTE — Progress Notes (Signed)
   Subjective:    Patient ID: Carol Diaz, female    DOB: [birth year], 48 y.o.   MRN: [id number]  HPI Here today to discuss diagnosis of febrile and possible treatments. She started taking Cymbalta which is FDA approved for her fibromyalgia and is at the 60 mg dose. She's still experiencing flares and pain. She has had significant difficulty with quality of sleep.    Review of Systems     Objective:   Physical Exam  Constitutional: She is oriented to person, place, and time. She appears well-developed and well-nourished.  HENT:  Head: Normocephalic and atraumatic.  Cardiovascular: Normal rate, regular rhythm and normal heart sounds.   Pulmonary/Chest: Effort normal and breath sounds normal.  Neurological: She is alert and oriented to person, place, and time.  Skin: Skin is warm and dry.  Psychiatric: She has a normal mood and affect. Her behavior is normal.          Assessment & Plan:  Fibromyalgia-we discussed different treatment options today. She is Re: on Cymbalta which is FDA approved. We could certainly add Lyrica for better pain control. We'll start with 75 mg twice a day for one week and then increase to 150 mg. Warned about potential side effects including sedation which is the biggest. Also discussed working on her sleep which is poor right now. We'll start with 2.5 mg of Flexeril at bedtime. Would like her to take it nightly until I see her back in about one month. Explained how this helps with the muscle tissue in addition to helping with sleep. We also discussed getting into a regular exercise routine. I think she should start with something small at 10 or 15 minute walk daily. Been consistent as she possibly can except for days where her pain is more severe. And slowly building up a tolerance to that she doesn't have side effects for 2 or 3 days after exercise. Followup in one month.  Time spent 20 min, > 50% spent counseling about treatment options for fibromyalgia.

## 2013-06-26 NOTE — Patient Instructions (Signed)

## 2013-07-04 ENCOUNTER — Ambulatory Visit: Payer: BC Managed Care – PPO | Admitting: Family Medicine

## 2013-07-28 ENCOUNTER — Ambulatory Visit: Payer: BC Managed Care – PPO | Admitting: Family Medicine

## 2013-07-29 ENCOUNTER — Ambulatory Visit (INDEPENDENT_AMBULATORY_CARE_PROVIDER_SITE_OTHER): Payer: BC Managed Care – PPO | Admitting: Family Medicine

## 2013-07-29 ENCOUNTER — Encounter: Payer: Self-pay | Admitting: Family Medicine

## 2013-07-29 VITALS — BP 129/75 | HR 66 | Wt 153.0 lb

## 2013-07-29 DIAGNOSIS — R5383 Other fatigue: Secondary | ICD-10-CM

## 2013-07-29 DIAGNOSIS — M797 Fibromyalgia: Secondary | ICD-10-CM

## 2013-07-29 DIAGNOSIS — N920 Excessive and frequent menstruation with regular cycle: Secondary | ICD-10-CM

## 2013-07-29 DIAGNOSIS — N898 Other specified noninflammatory disorders of vagina: Secondary | ICD-10-CM

## 2013-07-29 DIAGNOSIS — R5381 Other malaise: Secondary | ICD-10-CM

## 2013-07-29 DIAGNOSIS — IMO0001 Reserved for inherently not codable concepts without codable children: Secondary | ICD-10-CM

## 2013-07-29 DIAGNOSIS — R635 Abnormal weight gain: Secondary | ICD-10-CM

## 2013-07-29 MED ORDER — PREGABALIN 150 MG PO CAPS: 150.0000 mg | ORAL_CAPSULE | Freq: Two times a day (BID) | ORAL | Status: DC

## 2013-07-29 MED ORDER — CYCLOBENZAPRINE HCL 5 MG PO TABS: 2.5000 mg | ORAL_TABLET | Freq: Every day | ORAL | Status: DC

## 2013-07-29 MED ORDER — PHENTERMINE HCL 37.5 MG PO CAPS: 37.5000 mg | ORAL_CAPSULE | ORAL | Status: DC

## 2013-07-29 MED ORDER — SUMATRIPTAN SUCCINATE 100 MG PO TABS: 100.0000 mg | ORAL_TABLET | ORAL | Status: DC | PRN

## 2013-07-29 MED ORDER — DULOXETINE HCL 60 MG PO CPEP: ORAL_CAPSULE | ORAL | Status: DC

## 2013-07-29 MED ORDER — PROPRANOLOL HCL ER 60 MG PO CP24: ORAL_CAPSULE | ORAL | Status: DC

## 2013-07-29 MED ORDER — PRAVASTATIN SODIUM 40 MG PO TABS: 40.0000 mg | ORAL_TABLET | Freq: Every day | ORAL | Status: DC

## 2013-07-29 NOTE — Progress Notes (Signed)
Subjective:    Patient ID: Carol Diaz, female    DOB: 1966/03/06, 48 y.o.   MRN: [id number]  HPI Fibromyalgia - here for followup for fibromyalgia. I saw her 4 weeks ago and we added Lyrica to her regimen. She is Re: on Cymbalta. We also added a low dose of Flexeril at bedtime to see if this helped with her sleep which she has been struggling with. She has had more good days then bad days. She is sleeping well.  Lyrica does make her a little sleepy. She has tried to exercise a couple of times.   She is frustrating trying to loose weight.  She has been using myfitness pal for 4 weeks and only lost 1.5 lbs. she really has not been able to exercise like she would like because of the fibromyalgia. She has had a program to lose about 2 pounds per week. She was eating about 1200 calories per day.  Menstrual Spotting daily for 4 months. She is currently on Seasonale which is the extended 3 pack birth control. At the end of this last pack she did have a normal period but it was much heavier than usual. Just started a new pack on Sunday.  No pelvic pain or abnormal discharge.  Review of Systems     Objective:   Physical Exam  Constitutional: She is oriented to person, place, and time. She appears well-developed and well-nourished.  HENT:  Head: Normocephalic and atraumatic.  Right Ear: External ear normal.  Left Ear: External ear normal.  Nose: Nose normal.  Mouth/Throat: Oropharynx is clear and moist.  TMs and canals are clear.   Eyes: Conjunctivae and EOM are normal. Pupils are equal, round, and reactive to light.  Neck: Neck supple. No thyromegaly present.  Cardiovascular: Normal rate, regular rhythm and normal heart sounds.   Pulmonary/Chest: Effort normal and breath sounds normal. She has no wheezes.  Lymphadenopathy:    She has no cervical adenopathy.  Neurological: She is alert and oriented to person, place, and time.  Skin: Skin is warm and dry.  Psychiatric: She has a normal  mood and affect.          Assessment & Plan:  Fibromyalgia - overall she is happy with her current regimen. She feels like the Lyrica has actually provided some pain relief for her. She's now 150 mg twice a day. Refill sent to pharmacy. Continue with Cymbalta and continue half a tab of 5 mg Flexeril at bedtime. Followup in 2 months. Goal is to start walking for 10 minutes at a time to 3 days per week.  Menstrual Spotting - says she just had a. I would like to see how she does have the next couple weeks during her new pill pack. If she continues to spot and I think it may be warranted to refer her to GYN for possible endometrial biopsy since she is over the age of 48. It is not unusual to get some spotting on the extended birth control packs but she has been on this for sometime  Abnormal weight gain - we discussed different options. She is Re: using the Smart phone application called my fitness PAL. Which is clearly helping but not helping her reach her goals. She would still like to lose 15-18 pounds. She's not able to exercise consistently at this point though we discussed again working on building up as low tolerance with her fibromyalgia. We discussed weight loss medication as a potential option. Written Information provided  phentermine. We discussed the risks and the benefits and the need for monthly followup. She would like to start the phentermine. Stop immediately if any chest pain or shortness of breath. Followup in one month for blood pressure and weight check.

## 2013-07-29 NOTE — Patient Instructions (Addendum)
My Fitness Pal   Phentermine tablets or capsules What is this medicine? PHENTERMINE (FEN ter meen) decreases your appetite. It is used with a reduced calorie diet and exercise to help you lose weight. This medicine may be used for other purposes; ask your health care provider or pharmacist if you have questions. COMMON BRAND NAME(S): Adipex-P, Atti-Plex P , Atti-Plex P Spansule , Fastin, Pro-Fast, Tara-8  What should I tell my health care provider before I take this medicine? They need to know if you have any of these conditions: -agitation -glaucoma -heart disease -high blood pressure -history of substance abuse -lung disease called Primary Pulmonary Hypertension (PPH) -taken an MAOI like Carbex, Eldepryl, Marplan, Nardil, or Parnate in last 14 days -thyroid disease -an unusual or allergic reaction to phentermine, other medicines, foods, dyes, or preservatives -pregnant or trying to get pregnant -breast-feeding How should I use this medicine? Take this medicine by mouth with a glass of water. Follow the directions on the prescription label. This medicine is usually taken 30 minutes before or 1 to 2 hours after breakfast. Avoid taking this medicine in the evening. It may interfere with sleep. Take your doses at regular intervals. Do not take your medicine more often than directed. Talk to your pediatrician regarding the use of this medicine in children. Special care may be needed. Overdosage: If you think you have taken too much of this medicine contact a poison control center or emergency room at once. NOTE: This medicine is only for you. Do not share this medicine with others. What if I miss a dose? If you miss a dose, take it as soon as you can. If it is almost time for your next dose, take only that dose. Do not take double or extra doses. What may interact with this medicine? Do not take this medicine with any of the following medications: -duloxetine -MAOIs like Carbex, Eldepryl,  Marplan, Nardil, and Parnate -medicines for colds or breathing difficulties like pseudoephedrine or phenylephrine -procarbazine -sibutramine -SSRIs like citalopram, escitalopram, fluoxetine, fluvoxamine, paroxetine, and sertraline -stimulants like dexmethylphenidate, methylphenidate or modafinil -venlafaxine This medicine may also interact with the following medications: -medicines for diabetes This list may not describe all possible interactions. Give your health care provider a list of all the medicines, herbs, non-prescription drugs, or dietary supplements you use. Also tell them if you smoke, drink alcohol, or use illegal drugs. Some items may interact with your medicine. What should I watch for while using this medicine? Notify your physician immediately if you become short of breath while doing your normal activities. Do not take this medicine within 6 hours of bedtime. It can keep you from getting to sleep. Avoid drinks that contain caffeine and try to stick to a regular bedtime every night. This medicine was intended to be used in addition to a healthy diet and exercise. The best results are achieved this way. This medicine is only indicated for short-term use. Eventually your weight loss may level out. At that point, the drug will only help you maintain your new weight. Do not increase or in any way change your dose without consulting your doctor. You may get drowsy or dizzy. Do not drive, use machinery, or do anything that needs mental alertness until you know how this medicine affects you. Do not stand or sit up quickly, especially if you are an older patient. This reduces the risk of dizzy or fainting spells. Alcohol may increase dizziness and drowsiness. Avoid alcoholic drinks. What side effects may I  notice from receiving this medicine? Side effects that you should report to your doctor or health care professional as soon as possible: -chest pain, palpitations -depression or severe  changes in mood -increased blood pressure -irritability -nervousness or restlessness -severe dizziness -shortness of breath -problems urinating -unusual swelling of the legs -vomiting Side effects that usually do not require medical attention (report to your doctor or health care professional if they continue or are bothersome): -blurred vision or other eye problems -changes in sexual ability or desire -constipation or diarrhea -difficulty sleeping -dry mouth or unpleasant taste -headache -nausea This list may not describe all possible side effects. Call your doctor for medical advice about side effects. You may report side effects to FDA at 1-800-FDA-1088. Where should I keep my medicine? Keep out of the reach of children. This medicine can be abused. Keep your medicine in a safe place to protect it from theft. Do not share this medicine with anyone. Selling or giving away this medicine is dangerous and against the law. Store at room temperature between 20 and 25 degrees C (68 and 77 degrees F). Keep container tightly closed. Throw away any unused medicine after the expiration date. NOTE: This sheet is a summary. It may not cover all possible information. If you have questions about this medicine, talk to your doctor, pharmacist, or health care provider.  2014, Elsevier/Gold Standard. (2010-04-27 11:02:44)    Phentermine; Topiramate extended-release capsules What is this medicine? Phentermine; topiramate (FEN ter meen; toe PYRE a mate) is a combination of two medicines used with a reduced calorie diet and exercise to help you lose weight. This medicine is only available through certified pharmacies enrolled in a special program. Your healthcare professional will tell you where you can get your medicine. If you have additional questions, you can visit the manufacture's website at [email or web address] or contact them by phone at [phone]. This medicine may be used for other  purposes; ask your health care provider or pharmacist if you have questions. COMMON BRAND NAME(S): Qsymia What should I tell my health care provider before I take this medicine? They need to know if you have any of these conditions: -agitation -diarrhea -depression or other mental illness -diabetes -glaucoma -heart disease -high or low blood pressure -history of anorexia or other eating disorder -history of substance abuse -kidney stones or kidney disease -liver disease -lung disease like asthma, obstructive pulmonary disease, emphysema -metabolic acidosis -on a ketogenic diet -scheduled for surgery or a procedure -suicidal thoughts, plans, or attempt; a previous suicide attempt by you or a family member -taken an MAOI like Carbex, Eldepryl, Marplan, Nardil, or Parnate in last 14 days -thyroid disease -an unusual or allergic reaction to phentermine, topiramate, other medicines, foods, dyes, or preservatives -pregnant or trying to get pregnant -breast-feeding How should I use this medicine? Take this medicine by mouth with a glass of water. Follow the directions on the prescription label. Do not crush or chew. This medicine is usually taken with or without food once per day in the morning. Avoid taking this medicine in the evening. It may interfere with sleep. Take your doses at regular intervals. Do not take your medicine more often than directed. A special MedGuide will be given to you by the pharmacist with each prescription and refill. Be sure to read this information carefully each time. Talk to your pediatrician regarding the use of this medicine in children. Special care may be needed. Overdosage: If you think you've taken too much of this medicine contact  a poison control center or emergency room at once. Overdosage: If you think you have taken too much of this medicine contact a poison control center or emergency room at once. NOTE: This medicine is only for you. Do not share  this medicine with others. What if I miss a dose? If you miss a dose, take it as soon as you can. If it is almost time for your next dose, take only that dose. Do not take double or extra doses. What may interact with this medicine? Do not take this medicine with any of the following medications: -MAOIs like Carbex, Eldepryl, Marplan, Nardil, and ParnateThis medicine may also interact with the following medications: -acetazolamide -amitriptyline -antihistamines for allergy, cough and cold -atropine -birth control pills -carbamazepine -certain medicines for bladder problems like oxybutynin, tolterodine -certain medicines for depression, anxiety, or psychotic disturbances -certain medicines for Parkinson's disease like benztropine, trihexyphenidyl -certain medicines for stomach problems like dicyclomine, hyoscyamine -certain medicines for travel sickness like scopolamine -dichlorphenamide -digoxin -diltiazem -diuretics -hydrochlorothiazide -ipratropium -lithium -medicines for diabetes -medicines for pain, sleep, or muscle relaxation -methazolamide -phenytoin -pioglitazone -stimulant medicines for attention disorders, weight loss, or to stay awake -valproic acid -zonisamide This list may not describe all possible interactions. Give your health care provider a list of all the medicines, herbs, non-prescription drugs, or dietary supplements you use. Also tell them if you smoke, drink alcohol, or use illegal drugs. Some items may interact with your medicine. What should I watch for while using this medicine? Visit your doctor or health care professional for regular checks on your progress. This medicine is intended to be used in addition to a healthy diet and appropriate exercise. The best results are achieved this way. Do not increase or in any way change your dose without consulting your doctor or health care professional. Do not take this medicine within 6 hours of bedtime. It can  keep you from getting to sleep. Avoid drinks that contain caffeine and try to stick to a regular bedtime every night. Do not stop taking this medicine suddenly. This increases the risk of seizures. This medicine can decrease sweating and increase your body temperature. Watch for signs of deceased sweating or fever. Avoid extreme heat, hot baths, and saunas. Be careful about exercising, especially in hot weather. Contact your health care provider right away if you notice a fever or decrease in sweating. You should drink plenty of fluids while taking this medicine. If you have had kidney stones in the past, this will help to reduce your chances of forming kidney stones. If you have stomach pain, with nausea or vomiting and yellowing of your eyes or skin, call your doctor immediately. You may get drowsy or dizzy. Do not drive, use machinery, or do anything that needs mental alertness until you know how this medicine affects you. Do not stand or sit up quickly, especially if you are an older patient. This reduces the risk of dizzy or fainting spells. Alcohol may increase dizziness and drowsiness. Avoid alcoholic drinks. This medicine may affect blood sugar levels. If you have diabetes, check with your doctor or health care professional before you change your diet or the dose of your diabetic medicine. Patients and their families should watch out for worsening depression or thoughts of suicide. Also watch out for sudden changes in feelings such as feeling anxious, agitated, panicky, irritable, hostile, aggressive, impulsive, severely restless, overly excited and hyperactive, or not being able to sleep. If this happens, especially at the beginning of treatment  or after a change in dose, call your health care professional. If you notice blurred vision, eye pain, or other eye problems, seek medical attention at once for an eye exam. This medicine may increase the chance of developing metabolic acidosis. If left  untreated, this can cause kidney stones, bone disease, or slowed growth in children. Symptoms include breathing fast, fatigue, loss of appetite, irregular heartbeat, or loss of consciousness. Call your doctor immediately if you experience any of these side effects. Also, tell your doctor about any surgery you plan on having while taking this medicine since this may increase your risk for metabolic acidosis. Women who become pregnant while using this medicine should contact their physician immediately. You should also contact The [facility] Pregnancy Surveillance Program which is a program that monitors pregnancies that occur during treatment. Contact the program by calling [phone]. What side effects may I notice from receiving this medicine? Side effects that you should report to your doctor or health care professional as soon as possible: -allergic reactions like skin rash, itching or hives, swelling of the face, lips, or tongue -blood in the urine -changes in vision -chest pain or chest tightness -confusion -depressed mood -difficulty breathing -dizziness -fast or irregular heartbeat -feeling anxious -irritable -loss of appetite -low blood pressure -pain in the lower back or side -pain, tingling, numbness in the hands or feet -pain when urinating -palpitations -redness, blistering, peeling or loosening of the skin, including inside the mouth -shortness of breath -suicidal thoughts or other mood changes -trouble passing urine or change in the amount of urine -trouble walking, dizziness, loss of balance or coordination -unusually weak or tired -vomiting  Side effects that usually do not require medical attention (Report these to your doctor or health care professional if they continue or are bothersome.): -change in sex drive or performance -changes in vision -constipation -diarrhea -dry mouth -headache -nausea -tremors -trouble sleeping -upset stomach This list may not  describe all possible side effects. Call your doctor for medical advice about side effects. You may report side effects to FDA at 1-800-FDA-1088. Where should I keep my medicine? Keep out of the reach of children. This medicine can be abused. Keep your medicine in a safe place to protect it from theft. Do not share this medicine with anyone. Selling or giving away this medicine is dangerous and against the law. Store at room temperature between 15 and 25 degrees C (59 and 77 degrees F). Throw away any unused medicine after the expiration date. NOTE: This sheet is a summary. It may not cover all possible information. If you have questions about this medicine, talk to your doctor, pharmacist, or health care provider.  2014, Elsevier/Gold Standard. (2010-10-17 13:56:53)

## 2013-08-07 ENCOUNTER — Other Ambulatory Visit: Payer: Self-pay | Admitting: Family Medicine

## 2013-08-26 ENCOUNTER — Encounter: Payer: Self-pay | Admitting: Family Medicine

## 2013-08-26 ENCOUNTER — Ambulatory Visit (INDEPENDENT_AMBULATORY_CARE_PROVIDER_SITE_OTHER): Payer: BC Managed Care – PPO | Admitting: Family Medicine

## 2013-08-26 VITALS — BP 103/64 | HR 72 | Wt 146.0 lb

## 2013-08-26 DIAGNOSIS — R635 Abnormal weight gain: Secondary | ICD-10-CM

## 2013-08-26 MED ORDER — PHENTERMINE HCL 37.5 MG PO CAPS: 37.5000 mg | ORAL_CAPSULE | ORAL | Status: DC

## 2013-08-26 NOTE — Progress Notes (Signed)
   Subjective:    Patient ID: Carol Diaz, female    DOB: December 05, 1965, 48 y.o.   MRN: [id number]  HPI Has lost 7 lbs this month on the phentermine.  Can cause dry mouth.  About twice a week has been doing some water exercises.  Doing MyFitness Pal 1200 calories.  No CP or palpitations. Not keeping her awake.    Review of Systems     Objective:   Physical Exam  Constitutional: She is oriented to person, place, and time. She appears well-developed and well-nourished.  HENT:  Head: Normocephalic and atraumatic.  Cardiovascular: Normal rate, regular rhythm and normal heart sounds.   Pulmonary/Chest: Effort normal and breath sounds normal.  Neurological: She is alert and oriented to person, place, and time.  Skin: Skin is warm and dry.  Psychiatric: She has a normal mood and affect. Her behavior is normal.          Assessment & Plan:  Abnormal weight gain - she has done fantastic has lost 7lbs.  F/U in 1 mo for nurse visit and weight check. She is exercising some and is watching her calories.

## 2013-09-07 ENCOUNTER — Other Ambulatory Visit: Payer: Self-pay | Admitting: Family Medicine

## 2013-09-25 ENCOUNTER — Ambulatory Visit (INDEPENDENT_AMBULATORY_CARE_PROVIDER_SITE_OTHER): Payer: BC Managed Care – PPO | Admitting: Sports Medicine

## 2013-09-25 ENCOUNTER — Other Ambulatory Visit: Payer: Self-pay

## 2013-09-25 VITALS — BP 117/72 | HR 64 | Wt 142.0 lb

## 2013-09-25 DIAGNOSIS — E669 Obesity, unspecified: Secondary | ICD-10-CM | POA: Insufficient documentation

## 2013-09-25 MED ORDER — PHENTERMINE HCL 37.5 MG PO CAPS: 37.5000 mg | ORAL_CAPSULE | ORAL | Status: DC

## 2013-09-25 NOTE — Assessment & Plan Note (Signed)
Continue phentermine. Return in a month for a weight check.

## 2013-09-25 NOTE — Progress Notes (Signed)
   Subjective:    Patient ID: Carol Diaz, female    DOB: 1965-07-13, 48 y.o.   MRN: [id number]  HPI   Patient doing well on appetite suppressant.  Here for nurse visit, weight, BP, HR check.  Denies problems with insomnia, heart palpitations or tremors.  Satisfied with weight loss thus far and is working on [employer] diet and regular exercise.    Review of Systems     Objective:   Physical Exam        Assessment & Plan:

## 2013-10-07 ENCOUNTER — Other Ambulatory Visit: Payer: Self-pay | Admitting: Family Medicine

## 2013-10-24 ENCOUNTER — Ambulatory Visit (INDEPENDENT_AMBULATORY_CARE_PROVIDER_SITE_OTHER): Payer: BC Managed Care – PPO | Admitting: Family Medicine

## 2013-10-24 ENCOUNTER — Encounter: Payer: Self-pay | Admitting: Family Medicine

## 2013-10-24 VITALS — BP 132/78 | HR 77 | Temp 98.7°F | Wt 141.0 lb

## 2013-10-24 DIAGNOSIS — R635 Abnormal weight gain: Secondary | ICD-10-CM

## 2013-10-24 MED ORDER — PHENTERMINE HCL 37.5 MG PO CAPS: 37.5000 mg | ORAL_CAPSULE | ORAL | Status: DC

## 2013-10-24 NOTE — Progress Notes (Signed)
Pt informed and she is doing the fitness PAL. She is going to up her water intake and she will f/u in 1 month./Jovontae Banko,CMA

## 2013-10-24 NOTE — Progress Notes (Signed)
Pt came in today for weigh and blood pressure check. She is down 1 lbs. Pt is counting her calories and exercising. Pt does complain of staying hungry the past 2 weeks. She started her menstrual cycle this week. So unsure if its hormonal or just not getting enough to eat./ denies chest pain, Ha, palpitations, mood changes./Carol Diaz,CMA

## 2013-10-24 NOTE — Progress Notes (Signed)
   Subjective:    Patient ID: Carol Diaz, female    DOB: Nov 26, 1965, 48 y.o.   MRN: [id number]  HPI    Review of Systems     Objective:   Physical Exam        Assessment & Plan:  Abnormal weight gain-recommend the Smart phone application called my fitness PAL it can help her track calories so that she's not under eating which can certainly make her feel tired and weak. He has lost 1 pound so we'll go ahead and refill her phentermine. Followup in one month for blood pressure and weight check. Nani Gasseratherine Donevin Sainsbury, MD

## 2013-11-06 ENCOUNTER — Other Ambulatory Visit: Payer: Self-pay | Admitting: Family Medicine

## 2013-11-26 ENCOUNTER — Ambulatory Visit (INDEPENDENT_AMBULATORY_CARE_PROVIDER_SITE_OTHER): Payer: BC Managed Care – PPO | Admitting: Family Medicine

## 2013-11-26 VITALS — BP 129/82 | HR 70 | Ht 66.0 in | Wt 143.4 lb

## 2013-11-26 DIAGNOSIS — R635 Abnormal weight gain: Secondary | ICD-10-CM

## 2013-11-26 MED ORDER — PHENTERMINE HCL 37.5 MG PO CAPS: 37.5000 mg | ORAL_CAPSULE | ORAL | Status: DC

## 2013-11-26 NOTE — Progress Notes (Signed)
   Subjective:    Patient ID: Carol Diaz, female    DOB: Aug 10, 1965, 48 y.o.   MRN: [id number]  HPI Patient came to office for BP/wt follow up for medication refill. Patient gained 2 lbs during this past month. Corliss Skains, CMA    Review of Systems     Objective:   Physical Exam        Assessment & Plan:  Weight gain-initially she didn't have to medication. Maybe that she is taking a plateau. She felt like she really didn't drink enough water this last month. I will refill it for one more month and encouraged her to get back on track with diet and exercise as she is able. She is somewhat limited because of her fibromyalgia. If she's not at goal in one month then will not refill the medication. Nani Gasser, MD

## 2013-12-07 ENCOUNTER — Other Ambulatory Visit: Payer: Self-pay | Admitting: Family Medicine

## 2014-01-01 ENCOUNTER — Ambulatory Visit (INDEPENDENT_AMBULATORY_CARE_PROVIDER_SITE_OTHER): Payer: BC Managed Care – PPO | Admitting: Sports Medicine

## 2014-01-01 ENCOUNTER — Telehealth: Payer: Self-pay | Admitting: *Deleted

## 2014-01-01 ENCOUNTER — Encounter: Payer: Self-pay | Admitting: Sports Medicine

## 2014-01-01 ENCOUNTER — Ambulatory Visit (INDEPENDENT_AMBULATORY_CARE_PROVIDER_SITE_OTHER): Payer: BC Managed Care – PPO

## 2014-01-01 VITALS — BP 118/77 | HR 79 | Ht 66.0 in | Wt 140.0 lb

## 2014-01-01 DIAGNOSIS — D509 Iron deficiency anemia, unspecified: Secondary | ICD-10-CM | POA: Diagnosis not present

## 2014-01-01 DIAGNOSIS — S9031XA Contusion of right foot, initial encounter: Secondary | ICD-10-CM | POA: Insufficient documentation

## 2014-01-01 DIAGNOSIS — S99921A Unspecified injury of right foot, initial encounter: Secondary | ICD-10-CM | POA: Diagnosis not present

## 2014-01-01 DIAGNOSIS — M25474 Effusion, right foot: Secondary | ICD-10-CM

## 2014-01-01 DIAGNOSIS — M25571 Pain in right ankle and joints of right foot: Secondary | ICD-10-CM

## 2014-01-01 MED ORDER — TRAMADOL HCL 50 MG PO TABS: ORAL_TABLET | ORAL | Status: DC

## 2014-01-01 NOTE — Assessment & Plan Note (Signed)
Occurred 2 weeks ago. There is still significant bruising and swelling and pain, x-rays were negative at an outside facilily at this point we are going to obtain a CT scan of the right foot, she is exquisitely tender over the metatarsals dorsally. Tramadol for pain. CAM boot. Strap with compressive dressing.

## 2014-01-01 NOTE — Assessment & Plan Note (Signed)
Some orthostasis in the morning. Checking blood work, advised to hydrate with salt-containing beverages. She will followup with PCP regarding medical complaints.

## 2014-01-01 NOTE — Progress Notes (Signed)
   Subjective:    I'm seeing this patient as a consultation for:  Dr. Rogelia Mirehomas Shields  CC: Right foot pain  HPI: This is a very pleasant 48 year old female, 2 weeks ago she had any object fall on her right foot, she had immediate pain, swelling, bruising, she was seen in the emergency department where x-rays were negative for fracture. She continues to have persistent pain, with swelling and bruising over the dorsum of her foot with significant difficulty walking.  Orthostasis: Has occurred a couple of times over the last 2 weeks, gets dizzy when she wakes up in the morning and rises. History of anemia, no chest pain, visual changes, shortness of breath.  Past medical history, Surgical history, Family history not pertinant except as noted below, Social history, Allergies, and medications have been entered into the medical record, reviewed, and no changes needed.   Review of Systems: No headache, visual changes, nausea, vomiting, diarrhea, constipation, dizziness, abdominal pain, skin rash, fevers, chills, night sweats, weight loss, swollen lymph nodes, body aches, joint swelling, muscle aches, chest pain, shortness of breath, mood changes, visual or auditory hallucinations.   Objective:   General: Well Developed, well nourished, and in no acute distress.  Neuro/Psych: Alert and oriented x3, extra-ocular muscles intact, able to move all 4 extremities, sensation grossly intact. Skin: Warm and dry, no rashes noted.  Respiratory: Not using accessory muscles, speaking in full sentences, trachea midline.  Cardiovascular: Pulses palpable, no extremity edema. Abdomen: Does not appear distended. Right Foot: Mildly swollen with significant bruising. Range of motion is full in all directions. Strength is 5/5 in all directions. No hallux valgus. No pes cavus or pes planus. No abnormal callus noted. No pain over the navicular prominence, or base of fifth metatarsal. No tenderness to palpation of the  calcaneal insertion of plantar fascia. No pain at the Achilles insertion. No pain over the calcaneal bursa. No pain of the retrocalcaneal bursa. Tender to palpation over the dorsum of the second, third, and fourth metatarsal shaft. No hallux rigidus or limitus. No tenderness palpation over interphalangeal joints. No pain with compression of the metatarsal heads. Neurovascularly intact distally.  Impression and Recommendations:   This case required medical decision making of moderate complexity.

## 2014-01-01 NOTE — Telephone Encounter (Signed)
Stat CT authorization [id number] valid through 01/31/2014. Corliss SkainsJamie Shirlette Scarber, CMA

## 2014-01-02 LAB — CBC
HCT: 41.6 % (ref 36.0–46.0)
Hemoglobin: 13.8 g/dL (ref 12.0–15.0)
MCH: 30.1 pg (ref 26.0–34.0)
MCHC: 33.2 g/dL (ref 30.0–36.0)
MCV: 90.6 fL (ref 78.0–100.0)
Platelets: 290 K/uL (ref 150–400)
RBC: 4.59 MIL/uL (ref 3.87–5.11)
RDW: 14.1 % (ref 11.5–15.5)
WBC: 10.6 K/uL — ABNORMAL HIGH (ref 4.0–10.5)

## 2014-01-02 LAB — COMPREHENSIVE METABOLIC PANEL
Albumin: 3.9 g/dL (ref 3.5–5.2)
Alkaline Phosphatase: 50 U/L (ref 39–117)
BUN: 14 mg/dL (ref 6–23)
CO2: 24 mEq/L (ref 19–32)
Calcium: 9.5 mg/dL (ref 8.4–10.5)
Chloride: 106 mEq/L (ref 96–112)
Creat: 1.13 mg/dL — ABNORMAL HIGH (ref 0.50–1.10)
Glucose, Bld: 67 mg/dL — ABNORMAL LOW (ref 70–99)
Potassium: 4.7 mEq/L (ref 3.5–5.3)

## 2014-01-02 LAB — COMPREHENSIVE METABOLIC PANEL WITH GFR
ALT: 15 U/L (ref 0–35)
AST: 16 U/L (ref 0–37)
Sodium: 137 meq/L (ref 135–145)
Total Bilirubin: 0.5 mg/dL (ref 0.2–1.2)
Total Protein: 6.8 g/dL (ref 6.0–8.3)

## 2014-01-02 LAB — HEMOGLOBIN A1C
Hgb A1c MFr Bld: 5.3 % (ref <5.7)
Mean Plasma Glucose: 105 mg/dL (ref <117)

## 2014-01-02 LAB — TSH: TSH: 0.78 u[IU]/mL (ref 0.350–4.500)

## 2014-01-07 ENCOUNTER — Encounter: Payer: Self-pay | Admitting: Family Medicine

## 2014-01-07 ENCOUNTER — Ambulatory Visit (INDEPENDENT_AMBULATORY_CARE_PROVIDER_SITE_OTHER): Payer: BC Managed Care – PPO | Admitting: Family Medicine

## 2014-01-07 VITALS — BP 118/76 | HR 75 | Temp 98.2°F | Ht 66.0 in | Wt 143.0 lb

## 2014-01-07 DIAGNOSIS — R61 Generalized hyperhidrosis: Secondary | ICD-10-CM | POA: Diagnosis not present

## 2014-01-07 DIAGNOSIS — D72829 Elevated white blood cell count, unspecified: Secondary | ICD-10-CM

## 2014-01-07 DIAGNOSIS — N921 Excessive and frequent menstruation with irregular cycle: Secondary | ICD-10-CM | POA: Diagnosis not present

## 2014-01-07 DIAGNOSIS — Z23 Encounter for immunization: Secondary | ICD-10-CM | POA: Diagnosis not present

## 2014-01-07 DIAGNOSIS — N939 Abnormal uterine and vaginal bleeding, unspecified: Secondary | ICD-10-CM | POA: Diagnosis not present

## 2014-01-07 DIAGNOSIS — N923 Ovulation bleeding: Secondary | ICD-10-CM

## 2014-01-07 DIAGNOSIS — N920 Excessive and frequent menstruation with regular cycle: Secondary | ICD-10-CM

## 2014-01-07 DIAGNOSIS — R5383 Other fatigue: Secondary | ICD-10-CM

## 2014-01-07 LAB — CBC WITH DIFFERENTIAL/PLATELET
Basophils Absolute: 0 10*3/uL (ref 0.0–0.1)
Basophils Relative: 0 % (ref 0–1)
Eosinophils Absolute: 0.2 10*3/uL (ref 0.0–0.7)
Eosinophils Relative: 3 % (ref 0–5)
HCT: 40 % (ref 36.0–46.0)
Hemoglobin: 13.5 g/dL (ref 12.0–15.0)
LYMPHS ABS: 2.7 10*3/uL (ref 0.7–4.0)
Lymphocytes Relative: 36 % (ref 12–46)
MCH: 29.7 pg (ref 26.0–34.0)
MCHC: 33.8 g/dL (ref 30.0–36.0)
MCV: 88.1 fL (ref 78.0–100.0)
Monocytes Absolute: 0.7 10*3/uL (ref 0.1–1.0)
Monocytes Relative: 9 % (ref 3–12)
NEUTROS PCT: 52 % (ref 43–77)
Neutro Abs: 3.9 10*3/uL (ref 1.7–7.7)
Platelets: 315 10*3/uL (ref 150–400)
RBC: 4.54 MIL/uL (ref 3.87–5.11)
RDW: 14.1 % (ref 11.5–15.5)
WBC: 7.5 10*3/uL (ref 4.0–10.5)

## 2014-01-07 MED ORDER — SUMATRIPTAN SUCCINATE 100 MG PO TABS: 100.0000 mg | ORAL_TABLET | ORAL | Status: DC | PRN

## 2014-01-07 MED ORDER — LEVONORGEST-ETH ESTRAD 91-DAY 0.15-0.03 MG PO TABS: 1.0000 | ORAL_TABLET | Freq: Every day | ORAL | Status: DC

## 2014-01-07 MED ORDER — PROPRANOLOL HCL ER 60 MG PO CP24: ORAL_CAPSULE | ORAL | Status: DC

## 2014-01-07 NOTE — Progress Notes (Signed)
   Subjective:    Patient ID: Carol Diaz, female    DOB: Jun 07, 1965, 48 y.o.   MRN: [id number]  HPI About 2 weeks ago she was having a lot of weakness in the AM.  Seen by my partner. Has had a couple of episdoes since the original episode but they were much more mild. She felt like she was a little lightheaded like she was going to faint. She did not actually faint. Denies any actual muscle weakness. She just felt extremely weak and tired. Each episode lasted minutes to hours.ike getting hot easily. Almost felt like was going to faint.  Getting night sweats.  No cough or ST or URI sxs.  Spotting almost daily between periods for almost 8 months. No cramping.  Occ notices a sharp pain in pelvic area but not persistent. She says some days it's likely that this is heavier. She is on extended control.   Review of Systems     Objective:   Physical Exam  Constitutional: She is oriented to person, place, and time. She appears well-developed and well-nourished.  HENT:  Head: Normocephalic and atraumatic.  Neck: Neck supple. No thyromegaly present.  Cardiovascular: Normal rate, regular rhythm and normal heart sounds.   Pulmonary/Chest: Effort normal and breath sounds normal.  Musculoskeletal: She exhibits no edema.  Lymphadenopathy:    She has no cervical adenopathy.  Neurological: She is alert and oriented to person, place, and time.  Skin: Skin is warm and dry.  Psychiatric: She has a normal mood and affect. Her behavior is normal.          Assessment & Plan:  Episodes of weakness/fatigue-unclear etiology. Certainly it could be related to her fibromyalgia but this is not her typical course. Interestingly her white blood cell count was mildly elevated that day. Certainly possible that she was starting to fight off a viral illness. I would like to repeat the white blood cell count to see if it's trending down and resolving. Certainly she gets worse or feels like a string happen more  frequently or develops a fever then please let me know.  Night sweats-unclear etiology at this point. She's actually on birth control so hormonally she should be well regulated and should not be experiencing significant. 4 postmenopausal symptoms.  Spotting in between menses-recommend referral to OB/GYN for further evaluation and possible endometrial biopsy especially since she is over age of 48. He is on extended birth control which could be contributing. She has never been a smoker

## 2014-01-08 ENCOUNTER — Telehealth: Payer: Self-pay | Admitting: *Deleted

## 2014-01-08 ENCOUNTER — Other Ambulatory Visit: Payer: Self-pay | Admitting: Family Medicine

## 2014-01-08 ENCOUNTER — Other Ambulatory Visit: Payer: Self-pay | Admitting: *Deleted

## 2014-01-08 DIAGNOSIS — R635 Abnormal weight gain: Secondary | ICD-10-CM

## 2014-01-08 NOTE — Telephone Encounter (Signed)
Olegario MessierKathy was in to see you yesterday and requested a phentermine refill through MyChart. She has not lost weight since last refill and since she was seen yesterday I am not sure if this was addressed in appt or not. Her last refill  You said that 1 more refill would be given to try and loose weight but she hasn't lost any. Please advise. Corliss SkainsJamie Elleah Hemsley, CMA

## 2014-01-09 MED ORDER — PHENTERMINE HCL 37.5 MG PO CAPS: 37.5000 mg | ORAL_CAPSULE | ORAL | Status: DC

## 2014-01-09 NOTE — Telephone Encounter (Signed)
I will refill. She hurt her foot and hasn't been able to exercise so told ok to refill.  I forgot to send.

## 2014-01-09 NOTE — Telephone Encounter (Signed)
Patient aware. Madhuri Vacca, CMA 

## 2014-01-12 ENCOUNTER — Other Ambulatory Visit: Payer: Self-pay | Admitting: Family Medicine

## 2014-01-12 MED ORDER — GABAPENTIN 100 MG PO CAPS: ORAL_CAPSULE | ORAL | Status: DC

## 2014-01-15 ENCOUNTER — Ambulatory Visit (INDEPENDENT_AMBULATORY_CARE_PROVIDER_SITE_OTHER): Payer: BC Managed Care – PPO | Admitting: Sports Medicine

## 2014-01-15 ENCOUNTER — Encounter: Payer: Self-pay | Admitting: Sports Medicine

## 2014-01-15 VITALS — BP 125/75 | HR 78 | Ht 66.0 in | Wt 145.0 lb

## 2014-01-15 DIAGNOSIS — S99921D Unspecified injury of right foot, subsequent encounter: Secondary | ICD-10-CM | POA: Diagnosis not present

## 2014-01-15 NOTE — Assessment & Plan Note (Signed)
Clinically resolving. Clinical diagnosis is contusion. Return as needed.

## 2014-01-15 NOTE — Progress Notes (Signed)
  Subjective:    CC: Followup  HPI: This is a pleasant 48 year old female who returns approximately 2 weeks post right foot injury, x-rays and CT scan were negative, she continues to have only mild pain over the dorsum of the forefoot, she has discontinued her immobilization. Happy with results, continues to improve daily. Mild.  Past medical history, Surgical history, Family history not pertinant except as noted below, Social history, Allergies, and medications have been entered into the medical record, reviewed, and no changes needed.   Review of Systems: No fevers, chills, night sweats, weight loss, chest pain, or shortness of breath.   Objective:    General: Well Developed, well nourished, and in no acute distress.  Neuro: Alert and oriented x3, extra-ocular muscles intact, sensation grossly intact.  HEENT: Normocephalic, atraumatic, pupils equal round reactive to light, neck supple, no masses, no lymphadenopathy, thyroid nonpalpable.  Skin: Warm and dry, no rashes. Cardiac: Regular rate and rhythm, no murmurs rubs or gallops, no lower extremity edema.  Respiratory: Clear to auscultation bilaterally. Not using accessory muscles, speaking in full sentences. Right Foot: Small amount of bruising over the dorsum of the fourth and fifth metatarsal shafts with tenderness to palpation. Range of motion is full in all directions. Strength is 5/5 in all directions. No hallux valgus. No pes cavus or pes planus. No abnormal callus noted. No pain over the navicular prominence, or base of fifth metatarsal. No tenderness to palpation of the calcaneal insertion of plantar fascia. No pain at the Achilles insertion. No pain over the calcaneal bursa. No pain of the retrocalcaneal bursa. No tenderness to palpation over the tarsals, metatarsals, or phalanges. No hallux rigidus or limitus. No tenderness palpation over interphalangeal joints. No pain with compression of the metatarsal  heads. Neurovascularly intact distally.  Impression and Recommendations:

## 2014-01-26 ENCOUNTER — Encounter: Payer: Self-pay | Admitting: Obstetrics & Gynecology

## 2014-01-26 ENCOUNTER — Ambulatory Visit (INDEPENDENT_AMBULATORY_CARE_PROVIDER_SITE_OTHER): Payer: BC Managed Care – PPO | Admitting: Obstetrics & Gynecology

## 2014-01-26 VITALS — BP 130/77 | HR 83 | Resp 16 | Ht 66.0 in | Wt 146.0 lb

## 2014-01-26 DIAGNOSIS — N915 Oligomenorrhea, unspecified: Secondary | ICD-10-CM | POA: Diagnosis not present

## 2014-01-26 DIAGNOSIS — N889 Noninflammatory disorder of cervix uteri, unspecified: Secondary | ICD-10-CM

## 2014-01-26 DIAGNOSIS — Z113 Encounter for screening for infections with a predominantly sexual mode of transmission: Secondary | ICD-10-CM | POA: Diagnosis not present

## 2014-01-26 DIAGNOSIS — Z0000 Encounter for general adult medical examination without abnormal findings: Secondary | ICD-10-CM

## 2014-01-26 DIAGNOSIS — Z124 Encounter for screening for malignant neoplasm of cervix: Secondary | ICD-10-CM

## 2014-01-26 DIAGNOSIS — Z1151 Encounter for screening for human papillomavirus (HPV): Secondary | ICD-10-CM | POA: Diagnosis not present

## 2014-01-26 DIAGNOSIS — N898 Other specified noninflammatory disorders of vagina: Secondary | ICD-10-CM | POA: Diagnosis not present

## 2014-01-26 DIAGNOSIS — N920 Excessive and frequent menstruation with regular cycle: Secondary | ICD-10-CM

## 2014-01-26 MED ORDER — NORGESTREL-ETHINYL ESTRADIOL 0.5-50 MG-MCG PO TABS: 1.0000 | ORAL_TABLET | Freq: Every day | ORAL | Status: DC

## 2014-01-26 NOTE — Progress Notes (Signed)
   Subjective:    Patient ID: Carol Diaz, female    DOB: 01/31/1966, 48 y.o.   MRN: [id number]  HPI  48 yo MW P2 (7327 and 48 yo daughters) here because of spotting 6-8 months. She is on seasonique and has a real period about every 3 months. The spotting is not related to IC. The spotting may be a little heavier if she is active.  Her TSH and hbg were normal recently.  Review of Systems     Objective:   Physical Exam Her cervix had a brownish lesion (?blood vessel) at the 7 o'clock position. I biopsied it and obtained hemostasis with silver nitrate.  Pap obtained       Assessment & Plan:  Irregular spotting. I will get an u/s and await the biopsy of her cervix. In the meantime, she will change her OCPs to generic ovral for 3 months prior to changing back to her seasonique. RTC 2 weeks

## 2014-01-27 ENCOUNTER — Ambulatory Visit (INDEPENDENT_AMBULATORY_CARE_PROVIDER_SITE_OTHER): Payer: BC Managed Care – PPO

## 2014-01-27 DIAGNOSIS — N938 Other specified abnormal uterine and vaginal bleeding: Secondary | ICD-10-CM

## 2014-01-27 DIAGNOSIS — N920 Excessive and frequent menstruation with regular cycle: Secondary | ICD-10-CM

## 2014-01-28 LAB — CERVICOVAGINAL ANCILLARY ONLY
Chlamydia: NEGATIVE
Neisseria Gonorrhea: NEGATIVE

## 2014-01-29 LAB — CYTOLOGY - PAP

## 2014-02-04 ENCOUNTER — Encounter: Payer: Self-pay | Admitting: Obstetrics & Gynecology

## 2014-02-04 ENCOUNTER — Ambulatory Visit (INDEPENDENT_AMBULATORY_CARE_PROVIDER_SITE_OTHER): Payer: BC Managed Care – PPO | Admitting: Obstetrics & Gynecology

## 2014-02-04 VITALS — BP 130/74 | HR 82 | Resp 16 | Ht 66.0 in | Wt 146.0 lb

## 2014-02-04 DIAGNOSIS — N938 Other specified abnormal uterine and vaginal bleeding: Secondary | ICD-10-CM | POA: Diagnosis not present

## 2014-02-04 DIAGNOSIS — N920 Excessive and frequent menstruation with regular cycle: Secondary | ICD-10-CM

## 2014-02-04 NOTE — Progress Notes (Signed)
   Subjective:    Patient ID: Carol Diaz, female    DOB: May 19, 1965, 48 y.o.   MRN: [id number]  HPI 48 yo lady who is here today to go over her results. She was having some irregular bleeding. Her u/s, pap, and cervical biopsy were normal. I changed her OCPs and her bleeding issue is getting better.  Review of Systems She gets her mammogram at work every winter.    Objective:   Physical Exam        Assessment & Plan:  DUB- resolving with change of OCPs. Reassurance given RTC prn

## 2014-02-09 ENCOUNTER — Other Ambulatory Visit: Payer: Self-pay | Admitting: Family Medicine

## 2014-02-09 DIAGNOSIS — R635 Abnormal weight gain: Secondary | ICD-10-CM

## 2014-02-09 MED ORDER — PHENTERMINE HCL 37.5 MG PO CAPS: 37.5000 mg | ORAL_CAPSULE | ORAL | Status: DC

## 2014-02-10 ENCOUNTER — Telehealth: Payer: Self-pay | Admitting: *Deleted

## 2014-02-10 ENCOUNTER — Ambulatory Visit (INDEPENDENT_AMBULATORY_CARE_PROVIDER_SITE_OTHER): Payer: BC Managed Care – PPO | Admitting: Sports Medicine

## 2014-02-10 ENCOUNTER — Ambulatory Visit: Payer: BC Managed Care – PPO

## 2014-02-10 ENCOUNTER — Encounter: Payer: Self-pay | Admitting: Sports Medicine

## 2014-02-10 VITALS — BP 118/70 | HR 73 | Ht 66.0 in | Wt 145.0 lb

## 2014-02-10 DIAGNOSIS — E669 Obesity, unspecified: Secondary | ICD-10-CM

## 2014-02-10 DIAGNOSIS — J0110 Acute frontal sinusitis, unspecified: Secondary | ICD-10-CM

## 2014-02-10 MED ORDER — NALTREXONE-BUPROPION HCL ER 8-90 MG PO TB12: ORAL_TABLET | ORAL | Status: DC

## 2014-02-10 MED ORDER — FLUTICASONE PROPIONATE 50 MCG/ACT NA SUSP: NASAL | Status: DC

## 2014-02-10 MED ORDER — AZITHROMYCIN 250 MG PO TABS: ORAL_TABLET | ORAL | Status: DC

## 2014-02-10 NOTE — Patient Instructions (Signed)
Do some research on Belviq and Contrave, and please let Dr. Linford ArnoldMetheney know when you have decided.  We do have some discount coupons here.

## 2014-02-10 NOTE — Telephone Encounter (Signed)
Pt informed card placed up front.Loralee PacasBarkley, [name] [place]

## 2014-02-10 NOTE — Telephone Encounter (Signed)
Prescription sent for the first month of Contrave, she does need to come back and grab a discount coupon, please let her know to run this through the coupon as her primary insurance rather than her actual insurance company

## 2014-02-10 NOTE — Assessment & Plan Note (Signed)
Azithromycin, flonase. Return 2 weeks prn.

## 2014-02-10 NOTE — Assessment & Plan Note (Signed)
No weight loss in the past couple of months on phentermine, I have recommended Belviq and Contrave, she will discuss this with her PCP.

## 2014-02-10 NOTE — Progress Notes (Signed)
  Subjective:    CC: sinus infection  HPI: For the past 1.5-2 weeks this very pleasant 48 year old female has had increasing pain and pressure over her frontal and maxillary sinuses, moderate, persistent. She does endorse a double thickening, no lower respiratory symptoms, no GI symptoms, no skin rashes. Symptoms are moderate, persistent.  Past medical history, Surgical history, Family history not pertinant except as noted below, Social history, Allergies, and medications have been entered into the medical record, reviewed, and no changes needed.   Review of Systems: No fevers, chills, night sweats, weight loss, chest pain, or shortness of breath.   Objective:    General: Well Developed, well nourished, and in no acute distress.  Neuro: Alert and oriented x3, extra-ocular muscles intact, sensation grossly intact.  HEENT: Normocephalic, atraumatic, pupils equal round reactive to light, neck supple, no masses, no lymphadenopathy, thyroid nonpalpable. Oropharynx, nasopharynx, external ear canals are unremarkable, tender to palpation over the frontal and maxillary sinuses. Skin: Warm and dry, no rashes. Cardiac: Regular rate and rhythm, no murmurs rubs or gallops, no lower extremity edema.  Respiratory: Clear to auscultation bilaterally. Not using accessory muscles, speaking in full sentences.  Impression and Recommendations:

## 2014-02-10 NOTE — Telephone Encounter (Signed)
Pt called and stated that she would like for Dr. Benjamin Stainhekkekandam to write Rx for Contrave.Loralee PacasBarkley, Kashis Penley [place]

## 2014-03-18 ENCOUNTER — Other Ambulatory Visit: Payer: Self-pay | Admitting: *Deleted

## 2014-03-18 MED ORDER — NALTREXONE-BUPROPION HCL ER 8-90 MG PO TB12: ORAL_TABLET | ORAL | Status: DC

## 2014-03-25 ENCOUNTER — Telehealth: Payer: Self-pay | Admitting: *Deleted

## 2014-03-25 NOTE — Telephone Encounter (Signed)
Contrave denied through insurance for prior authorization twice.

## 2014-04-13 ENCOUNTER — Other Ambulatory Visit: Payer: Self-pay | Admitting: Family Medicine

## 2014-04-16 ENCOUNTER — Other Ambulatory Visit: Payer: Self-pay | Admitting: *Deleted

## 2014-04-16 MED ORDER — GABAPENTIN 100 MG PO CAPS: ORAL_CAPSULE | ORAL | Status: DC

## 2014-04-21 ENCOUNTER — Telehealth: Payer: Self-pay

## 2014-04-21 MED ORDER — NALTREXONE-BUPROPION HCL ER 8-90 MG PO TB12
ORAL_TABLET | ORAL | Status: DC
Start: 2014-04-21 — End: 2014-09-14

## 2014-04-21 NOTE — Telephone Encounter (Signed)
Dr Benjamin Stainhekkekandam, Does Contrave require a nurse visit. Also, do you want patient's primary to follow up with this medication.

## 2014-04-21 NOTE — Telephone Encounter (Signed)
Yes, further refills and workup for Contrave will be through Dr. Linford ArnoldMetheney

## 2014-04-21 NOTE — Telephone Encounter (Signed)
Patient advised and refill sent. She has a follow up appointment next week.

## 2014-04-29 ENCOUNTER — Ambulatory Visit (INDEPENDENT_AMBULATORY_CARE_PROVIDER_SITE_OTHER): Payer: BC Managed Care – PPO | Admitting: Family Medicine

## 2014-04-29 ENCOUNTER — Encounter: Payer: Self-pay | Admitting: Family Medicine

## 2014-04-29 VITALS — BP 132/80 | HR 62 | Ht 66.0 in | Wt 136.0 lb

## 2014-04-29 DIAGNOSIS — M7711 Lateral epicondylitis, right elbow: Secondary | ICD-10-CM | POA: Diagnosis not present

## 2014-04-29 DIAGNOSIS — M542 Cervicalgia: Secondary | ICD-10-CM

## 2014-04-29 DIAGNOSIS — R635 Abnormal weight gain: Secondary | ICD-10-CM

## 2014-04-29 DIAGNOSIS — M797 Fibromyalgia: Secondary | ICD-10-CM | POA: Diagnosis not present

## 2014-04-29 DIAGNOSIS — S46819A Strain of other muscles, fascia and tendons at shoulder and upper arm level, unspecified arm, initial encounter: Secondary | ICD-10-CM

## 2014-04-29 MED ORDER — CYCLOBENZAPRINE HCL 10 MG PO TABS: 5.0000 mg | ORAL_TABLET | Freq: Three times a day (TID) | ORAL | Status: DC | PRN

## 2014-04-29 NOTE — Patient Instructions (Signed)
Fibromyalgia Fibromyalgia is a disorder that is often misunderstood. It is associated with muscular pains and tenderness that comes and goes. It is often associated with fatigue and sleep disturbances. Though it tends to be long-lasting, fibromyalgia is not life-threatening. CAUSES  The exact cause of fibromyalgia is unknown. People with certain gene types are predisposed to developing fibromyalgia and other conditions. Certain factors can play a role as triggers, such as:  Spine disorders.  Arthritis.  Severe injury (trauma) and other physical stressors.  Emotional stressors. SYMPTOMS   The main symptom is pain and stiffness in the muscles and joints, which can vary over time.  Sleep and fatigue problems. Other related symptoms may include:  Bowel and bladder problems.  Headaches.  Visual problems.  Problems with odors and noises.  Depression or mood changes.  Painful periods (dysmenorrhea).  Dryness of the skin or eyes. DIAGNOSIS  There are no specific tests for diagnosing fibromyalgia. Patients can be diagnosed accurately from the specific symptoms they have. The diagnosis is made by determining that nothing else is causing the problems. TREATMENT  There is no cure. Management includes medicines and an active, healthy lifestyle. The goal is to enhance physical fitness, decrease pain, and improve sleep. HOME CARE INSTRUCTIONS   Only take over-the-counter or prescription medicines as directed by your caregiver. Sleeping pills, tranquilizers, and pain medicines may make your problems worse.  Low-impact aerobic exercise is very important and advised for treatment. At first, it may seem to make pain worse. Gradually increasing your tolerance will overcome this feeling.  Learning relaxation techniques and how to control stress will help you. Biofeedback, visual imagery, hypnosis, muscle relaxation, yoga, and meditation are all options.  Anti-inflammatory medicines and  physical therapy may provide short-term help.  Acupuncture or massage treatments may help.  Take muscle relaxant medicines as suggested by your caregiver.  Avoid stressful situations.  Plan a healthy lifestyle. This includes your diet, sleep, rest, exercise, and friends.  Find and practice a hobby you enjoy.  Join a fibromyalgia support group for interaction, ideas, and sharing advice. This may be helpful. SEEK MEDICAL CARE IF:  You are not having good results or improvement from your treatment. FOR MORE INFORMATION  National Fibromyalgia Association: www.fmaware.org Arthritis Foundation: www.arthritis.org Document Released: 03/13/2005 Document Revised: 06/05/2011 Document Reviewed: 06/23/2009 ExitCare Patient Information 2015 [employer]. This information is not intended to replace advice given to you by your health care provider. Make sure you discuss any questions you have with your health care provider.  

## 2014-04-29 NOTE — Progress Notes (Signed)
Subjective:    Patient ID: Carol Diaz, female    DOB: February 20, 1966, 49 y.o.   MRN: [id number]  HPI  Having more pain over her anterior thighs and her elbows bilaterally.  She is having a lot of pain between her shoulder blades and in her neck.  Has burning sensation at the base of her neck. Has tried heating pad and warm baths. Feels like her neck got worse while out of work during snow days. She also feels like it's hard to turn her head all the way to look to see if there are cars coming when she's driving. She denies any recent trauma or injury. She did spend more time watching TV.    As far as her elbows are concerned she says the right is worse than the left.  She notices worse after she has been lfting her grandbaby.. No real alleivating sxs.  Painful when she reaches out to lift things. She does type on a computer most of the day. She also combines of some burning sensation in the anterior thighs.  Abnormal weight gain-she was able to get the prescription for contrary. She got the new coupon and the medication when 3. She has lost 9 pounds and has done fantastic with it. She denies any side effects. She said initially she felt a little off mentally the first couple days but has felt fine since then. She denies any nausea  Fibromyalgia-she is taking gabapentin 100 mg the morning and 200 at bedtime. She thinks it's helpful. She denies any excess sedation.  Review of Systems     Objective:   Physical Exam  Constitutional: She is oriented to person, place, and time. She appears well-developed and well-nourished.  HENT:  Head: Normocephalic and atraumatic.  Eyes: Conjunctivae and EOM are normal.  Cardiovascular: Normal rate.   Pulmonary/Chest: Effort normal.  Musculoskeletal:  Neck with normal flexion, extension. Slightly decreased rotation to the left. She had pain predominantly with extension and full flexion. She also had significant discomfort with head tilt to the left but range  of motion was normal. Shoulders with normal range of motion and strength. She is tender over the right lateral epicondyles. Significant discomfort with pronation against resistance at the elbow. Left elbow was nontender with normal range of motion and strength.  Neurological: She is alert and oriented to person, place, and time.  Skin: Skin is dry. No pallor.  Psychiatric: She has a normal mood and affect. Her behavior is normal.          Assessment & Plan:  Tennis elbow, right-her exam and history most consistent with tennis elbow. Recommend a elbow strap. Given handout on exercises to do. Recommend she start these after one week of taking an anti-inflammatory. She usually uses Aleve. Take one twice a day with food and water. Stop immediately if any GI upset. Would like to see her back in 6 weeks to make sure that she is improving. Avoid overuse and heavy lifting with that arm and elbow. She has similar pain on the left side but she's not tender over the lateral epicondyle. Will treat conservatively. Next  Cervical strain/trapezius strain-recommend a trial of muscle relaxer in addition to anti-inflammatory. We'll also refer her for physical therapy for further evaluation and treatment.  Abnormal weight gain-she's very happy with the results with the contrary. She is hoping to continue it for about one more month. She would like to lose about 6 more pounds. Next  Fibromyalgia-she asked for  a brief handout that she could have at work to help explain to people her disease process. Right now she's doing well the gabapentin. I'm not going to adjust her dose at this time.

## 2014-05-07 ENCOUNTER — Ambulatory Visit: Payer: BC Managed Care – PPO | Admitting: Physical Therapy

## 2014-05-13 ENCOUNTER — Encounter: Payer: Self-pay | Admitting: Family Medicine

## 2014-05-14 ENCOUNTER — Encounter: Payer: Self-pay | Admitting: Physical Therapy

## 2014-05-14 ENCOUNTER — Ambulatory Visit (INDEPENDENT_AMBULATORY_CARE_PROVIDER_SITE_OTHER): Payer: BC Managed Care – PPO | Admitting: Physical Therapy

## 2014-05-14 DIAGNOSIS — M542 Cervicalgia: Secondary | ICD-10-CM

## 2014-05-14 DIAGNOSIS — M436 Torticollis: Secondary | ICD-10-CM | POA: Diagnosis not present

## 2014-05-14 DIAGNOSIS — R531 Weakness: Secondary | ICD-10-CM

## 2014-05-14 NOTE — Patient Instructions (Signed)
   With arms at sides, pinch shoulder blades together. Repeat _10___ times per set. Do __1__ sets per session. Do __1-2__ sessions per day.  Isometric Abdominal     Head presses   Lying on back with knees bent, press head down. Hold __5__ seconds. Repeat __10__ times per set. Do __1__ sets per session. Do _1-2___ sessions per day.  http://orth.exer.us/1086   Copyright  VHI. All rights reserved.  Flexibility: Upper Trapezius Stretch   Gently grasp right side of head while reaching behind back with other hand, the turn chin towards your arm pit. Tilt head away until a gentle stretch is felt. Hold ____ seconds. Repeat on the other side Repeat __2__ times per set. Do __1__ sets per session. Do _1-2___ sessions per day.  http://orth.exer.us/340   Copyright  VHI. All rights reserved.

## 2014-05-14 NOTE — Patient Instructions (Addendum)
   With arms at sides, pinch shoulder blades together. Repeat _10___ times per set. Do __1__ sets per session. Do __1-2__ sessions per day.  Isometric Abdominal     Head presses   Lying on back with knees bent, press head down. Hold __5__ seconds. Repeat __10__ times per set. Do __1__ sets per session. Do _1-2___ sessions per day.  http://orth.exer.us/1086   Copyright  VHI. All rights reserved.  Flexibility: Upper Trapezius Stretch   Gently grasp right side of head while reaching behind back with other hand, the turn chin towards your arm pit. Tilt head away until a gentle stretch is felt. Hold ____ seconds. Repeat on the other side Repeat __2__ times per set. Do __1__ sets per session. Do _1-2___ sessions per day.  http://orth.exer.us/340   Copyright  VHI. All rights reserved.   

## 2014-05-14 NOTE — Therapy (Addendum)
[place], [place], [address] Phone: [phone]   Fax:  [phone]  Physical Therapy Evaluation  Patient Details  Name: Carol Diaz MRN: [id number] Date of Birth: 05/08/1965 Referring Provider:  Hali Marry, *  Encounter Date: 05/14/2014      PT End of Session - 05/14/14 0953    Visit Number 1   Number of Visits 8   Date for PT Re-Evaluation 06/11/14   PT Start Time 7322   PT Stop Time 0953   PT Time Calculation (min) 66 min      Past Medical History  Diagnosis Date  . Chronic fatigue syndrome   . Migraines   . Palpitations     Past Surgical History  Procedure Laterality Date  . Tubal ligation    . Ivp    . Cesarean section      There were no vitals taken for this visit.  Visit Diagnosis:  Stiffness of neck - Plan: PT plan of care cert/re-cert  Weakness generalized - Plan: PT plan of care cert/re-cert  Pain in neck - Plan: PT plan of care cert/re-cert      Subjective Assessment - 05/14/14 0852    Symptoms began having neck pain about 4-5 months ago, insidious onset   Patient Stated Goals watch TV, exercise - was walking, reading and work all without pain   Currently in Pain? 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  No   Is the patient reluctant to leave their home because of a fear of falling?  No   Prior Function   Level of Independence Independent with basic ADLs   Observation/Other Assessments   Focus on Therapeutic Outcomes (FOTO)  52%   Posture/Postural Control   Posture Comments --  foward head, depressed shoulders, decreased lumbar lordosis   AROM   Cervical Flexion 50   Cervical Extension 30   Cervical - Right Rotation 48   Cervical - Left Rotation 75   Palpation   Palpation hypersentive to CPA mobs C2-5 with hypomobility.  Same with Rt UPA mobs C3-5.  tightness and tender in Rt levator and bilat upper traps   Special Tests    Special Tests Cervical   Cervical Tests Spurling's   Spurling's   Findings Negative                  [facility] Adult PT Treatment/Exercise - 05/14/14 0001    Exercises   Exercises Neck;Shoulder   Neck Exercises: Seated   Lateral Flexion Both  20 sec  Neck Exercises: Supine   Cervical Isometrics 5 secs;10 reps  head press into pillow   Shoulder Exercises: Seated   Elevation 10 reps   Retraction 10 reps   Manual Therapy   Manual Therapy Joint mobilization;Myofascial release  C3 UPA Rt mobs Grade II-III, upper trap and levator stretchi   Myofascial Release manual tracition.                 PT Education - 05/14/14 0955    Education provided Yes   Education Details initial HEP   Person(s) Educated Patient   Methods Explanation;Demonstration;Handout   Comprehension Returned demonstration             PT Long Term Goals - 05/14/14 1027    PT LONG TERM GOAL #1   Title I with advanced HEP   Time 4   Period Weeks   Status New   PT LONG TERM GOAL #2   Title increase cervical rotation =/> 65 degrees bilat   Time 4   Period Weeks   Status New   PT LONG TERM GOAL #3   Title decrease pain in neck =/> 75% with daily activity   Time 4   Period Weeks   Status  New   PT LONG TERM GOAL #4   Title improve FOTO =/< 37%   Time 4   Period Weeks   Status New   PT LONG TERM GOAL #5   Title increase strength upper back =/> 4=/5   Time 4   Period Weeks   Status New               Plan - 05/14/14 0957    Clinical Impression Statement Patient presents with upper neck pain over the last 5 months, has very tight Rt levator and hypomobile C spine.     Pt will benefit from skilled therapeutic intervention in order to improve on the following deficits Decreased range of motion;Postural dysfunction;Increased muscle spasms   Rehab Potential Excellent   PT Frequency 2x / week   PT Duration 4 weeks   PT Treatment/Interventions Moist Heat;Patient/family education;Traction;Therapeutic exercise;Passive range of motion;Ultrasound;Manual techniques;Cryotherapy;Neuromuscular re-education;Electrical Stimulation   PT Next Visit Plan STW, TPR, joint mobs, cervical stab ex and modalities         Problem List Patient Active Problem List   Diagnosis Date Noted  . Acute frontal sinusitis 02/10/2014  . Contusion of right foot 01/01/2014  . Obesity 09/25/2013  . Essential tremor 02/02/2012  . Other and unspecified hyperlipidemia 12/15/2011  . GROSS HEMATURIA 04/06/2010  . Iron deficiency anemia 09/22/2009  . PALPITATIONS 09/22/2009  . FATIGUE 08/31/2009  . ESSENTIAL HYPERTENSION, BENIGN 12/11/2008  . HYPOGLYCEMIA, UNSPECIFIED 11/20/2008  . MENORRHAGIA 09/05/2007  . TREMOR 03/13/2007  . DEPRESSION, MAJOR, RECURRENT 01/02/2006  . MIGRAINE, UNSPEC., W/O INTRACTABLE MIGRAINE 01/02/2006  . IRRITABLE BOWEL SYNDROME 01/02/2006  . INSOMNIA NOS 01/02/2006    Jeral Pinch, PT  05/14/2014, 10:29 AM  [facility] [address] [place], [place], [address] Phone: [phone]   Fax:  [phone]    PHYSICAL THERAPY DISCHARGE SUMMARY  Visits from Start of Care: 1  Current functional level related to  goals / functional outcomes: Unchanged - one visit only   Remaining deficits: Unchanged   Education / Equipment: Initial HEP Plan: Patient agrees to discharge.  Patient goals were not met. Patient is being discharged due to not returning since the last visit.  ?????   Celyn P. Helene Kelp PT,  MPH 03/05/2015 8:34 AM

## 2014-05-15 ENCOUNTER — Other Ambulatory Visit: Payer: Self-pay | Admitting: Family Medicine

## 2014-05-15 MED ORDER — PHENTERMINE HCL 37.5 MG PO CAPS: 37.5000 mg | ORAL_CAPSULE | ORAL | Status: DC

## 2014-05-19 ENCOUNTER — Encounter: Payer: BC Managed Care – PPO | Admitting: Physical Therapy

## 2014-05-25 ENCOUNTER — Encounter: Payer: BC Managed Care – PPO | Admitting: Physical Therapy

## 2014-05-28 ENCOUNTER — Encounter: Payer: BC Managed Care – PPO | Admitting: Physical Therapy

## 2014-06-01 ENCOUNTER — Encounter: Payer: BC Managed Care – PPO | Admitting: Physical Therapy

## 2014-06-04 ENCOUNTER — Encounter: Payer: BC Managed Care – PPO | Admitting: Physical Therapy

## 2014-06-11 ENCOUNTER — Encounter: Payer: Self-pay | Admitting: Family Medicine

## 2014-06-11 ENCOUNTER — Other Ambulatory Visit: Payer: Self-pay | Admitting: Family Medicine

## 2014-06-11 ENCOUNTER — Ambulatory Visit (INDEPENDENT_AMBULATORY_CARE_PROVIDER_SITE_OTHER): Payer: BC Managed Care – PPO | Admitting: Family Medicine

## 2014-06-11 ENCOUNTER — Telehealth: Payer: Self-pay | Admitting: Family Medicine

## 2014-06-11 VITALS — BP 125/87 | HR 81 | Wt 133.0 lb

## 2014-06-11 DIAGNOSIS — R635 Abnormal weight gain: Secondary | ICD-10-CM | POA: Diagnosis not present

## 2014-06-11 DIAGNOSIS — M7711 Lateral epicondylitis, right elbow: Secondary | ICD-10-CM | POA: Diagnosis not present

## 2014-06-11 DIAGNOSIS — J309 Allergic rhinitis, unspecified: Secondary | ICD-10-CM

## 2014-06-11 DIAGNOSIS — S161XXD Strain of muscle, fascia and tendon at neck level, subsequent encounter: Secondary | ICD-10-CM

## 2014-06-11 MED ORDER — CYCLOBENZAPRINE HCL 10 MG PO TABS: 5.0000 mg | ORAL_TABLET | Freq: Three times a day (TID) | ORAL | Status: DC | PRN

## 2014-06-11 MED ORDER — PHENTERMINE HCL 37.5 MG PO CAPS: 37.5000 mg | ORAL_CAPSULE | ORAL | Status: DC

## 2014-06-11 NOTE — Telephone Encounter (Signed)
Call pt: we [facility] and [facility] have several therapist who works specifically with fibromyalgia patients if she is interested. She can think about it.

## 2014-06-11 NOTE — Progress Notes (Signed)
   Subjective:    Patient ID: Carol Diaz, female    DOB: 05-30-1965, 49 y.o.   MRN: [id number]  HPI ST x 4 days, no cough or fever. Throat has been itching.  + post nasal drip. No significant sinus congestion and headaches or cough.   Tried to go PT once and says has had a lot of pain for about 6 days afterwards. She just doesn't think she's going to be able to do it. She felt was a little too aggressive..   Right tennis elbow -she has been wearing a strap Lynnea FerrierSolomon it does seem to be helpful. She thinks her work position might be sitting a little bit too low at work. She's also had some pain more medially over the right elbow over the last couple weeks that was not there before. She says in fact she thought she had presented initially but never saw a bruise.  Cervical strain - Says the muscle relaxer does help some.  Has been trying to rest some. She plans on going to an exercise class tonight and trying it out but doing so at her own pace.   Abnormal weight gain   Has lost 3 more days. She would really like to lose about 6 more pounds total.  Review of Systems     Objective:   Physical Exam  Constitutional: She is oriented to person, place, and time. She appears well-developed and well-nourished.  HENT:  Head: Normocephalic and atraumatic.  Right Ear: External ear normal.  Left Ear: External ear normal.  Nose: Nose normal.  Mouth/Throat: Oropharynx is clear and moist.  TMs and canals are clear.   Eyes: Conjunctivae and EOM are normal. Pupils are equal, round, and reactive to light.  Neck: Neck supple. No thyromegaly present.  Cardiovascular: Normal rate, regular rhythm and normal heart sounds.   Pulmonary/Chest: Effort normal and breath sounds normal. She has no wheezes.  Lymphadenopathy:    She has no cervical adenopathy.  Neurological: She is alert and oriented to person, place, and time.  Skin: Skin is warm and dry.  Psychiatric: She has a normal mood and affect.    She  still very tender over the right lateral epicondyles and a little tender medially as well. Still significant pain with supination against resistance. But strength is normal at the elbow and wrist.      Assessment & Plan:  Allergic rhinitis-encouraged her to restart her Flonase for the spring. If this is not helpful or if she's getting some irritation can consider switching to an oral antihistamine.  Right lateral epicondylitis-still tender over the lateral epicondyles but overall symptoms have improved. We discussed that ultimately she needs to improve her work station and situation to help prevent recurrent inflammation and injury to the area. 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 We discussed getting risers to elevate her computer screen and maybe even getting one of the riser that allows her to stand up. Starting the day and still use her computer. Next  Cervical strain-seems to be improving. The muscle relaxer was helpful. I did refill that today. I will also check with [facility] to see if they still have a therapist to specializes in patients with fibromyalgia. They do and I'll give her call back and let her know what's going on.  Abnormal weight gain-she's lost 3 more pounds which is fantastic. We'll refill the phentermine and she can follow-up in one month for nurse blood pressure check.

## 2014-06-12 NOTE — Telephone Encounter (Signed)
Recommendations given to patient.Carol PacasBarkley, Katherene Dinino PomeroyLynetta

## 2014-06-30 ENCOUNTER — Telehealth: Payer: Self-pay | Admitting: Family Medicine

## 2014-06-30 NOTE — Telephone Encounter (Signed)
Dr. Linford ArnoldMetheney, Carol KillingsVickie Diaz called. She and Carol Fredric MareBailey has been best friends for 27 yrs. and she wants to know if she can become your patient.  Thank you. Ph # [phone]

## 2014-07-01 NOTE — Telephone Encounter (Signed)
That sounds fine. I'll be happy to see her.

## 2014-07-01 NOTE — Telephone Encounter (Signed)
Dr Linford ArnoldMetheney, When I called Mrs. Ladd to give her appt time she asked if her husband and daughter can become patients as well.

## 2014-07-03 NOTE — Telephone Encounter (Signed)
Ok with me 

## 2014-07-15 ENCOUNTER — Other Ambulatory Visit: Payer: Self-pay | Admitting: Family Medicine

## 2014-07-15 MED ORDER — PHENTERMINE HCL 37.5 MG PO CAPS: 37.5000 mg | ORAL_CAPSULE | ORAL | Status: DC

## 2014-07-15 NOTE — Telephone Encounter (Signed)
Patient had a nurse visit schedule for tomorrow, but her husband is having a cardiac procedure today and needed to reschedule. Asked if we could call in her Phenteramine Rx since she only has one pill left. Patient reports she weighed 132lbs about a week ago. Will send over Rx.

## 2014-07-26 ENCOUNTER — Other Ambulatory Visit: Payer: Self-pay | Admitting: Family Medicine

## 2014-08-03 ENCOUNTER — Other Ambulatory Visit: Payer: Self-pay | Admitting: Family Medicine

## 2014-08-20 ENCOUNTER — Other Ambulatory Visit: Payer: Self-pay | Admitting: Family Medicine

## 2014-09-14 ENCOUNTER — Encounter: Payer: Self-pay | Admitting: Family Medicine

## 2014-09-14 ENCOUNTER — Ambulatory Visit (INDEPENDENT_AMBULATORY_CARE_PROVIDER_SITE_OTHER): Payer: BC Managed Care – PPO | Admitting: Family Medicine

## 2014-09-14 VITALS — BP 131/74 | HR 82 | Wt 141.0 lb

## 2014-09-14 DIAGNOSIS — K589 Irritable bowel syndrome without diarrhea: Secondary | ICD-10-CM | POA: Diagnosis not present

## 2014-09-14 DIAGNOSIS — S161XXD Strain of muscle, fascia and tendon at neck level, subsequent encounter: Secondary | ICD-10-CM | POA: Diagnosis not present

## 2014-09-14 DIAGNOSIS — M797 Fibromyalgia: Secondary | ICD-10-CM | POA: Diagnosis not present

## 2014-09-14 DIAGNOSIS — K5909 Other constipation: Secondary | ICD-10-CM

## 2014-09-14 DIAGNOSIS — R635 Abnormal weight gain: Secondary | ICD-10-CM | POA: Diagnosis not present

## 2014-09-14 MED ORDER — LINACLOTIDE 290 MCG PO CAPS
290.0000 ug | ORAL_CAPSULE | Freq: Every day | ORAL | Status: DC
Start: 2014-09-14 — End: 2014-09-16

## 2014-09-14 MED ORDER — PHENTERMINE HCL 37.5 MG PO CAPS: 37.5000 mg | ORAL_CAPSULE | ORAL | Status: DC

## 2014-09-14 NOTE — Progress Notes (Signed)
   Subjective:    Patient ID: Carol Diaz, female    DOB: December 06, 1965, 49 y.o.   MRN: [id number]  HPI Abnormal weight gain - has gained back 8 lbs.  She was on phentermine and doing very well. The last time we refilled it was in March. She was unable to come in after that because her husband had surgery and her father had a stroke. This is the first time she's been able to get back in.   Fibromyalgia - she is on flexeril prn and gabapentin. Has been really tired lately. She says her neck is better.  She is keeping her granddaugher today.   IBS - she is struggling more the constipation. She feels really gassy. Stomach gets really tender. Gets a lot cramping with BMs.  Last DM was about 3 days ago. Will use a women's laxative PRN.    Her neck pain is better overall. I had originally sent her to physical therapy but it caused more pain so she stopped it and try to just do her own stretches at home. It is better overall. She still having some pain with her elbows but doesn't want to do anything different at this time. She did change her workstation at work and got a riser for her computer and says that has actually made a big difference in her neck pain.  Review of Systems     Objective:   Physical Exam  Constitutional: She is oriented to person, place, and time. She appears well-developed and well-nourished.  HENT:  Head: Normocephalic and atraumatic.  Cardiovascular: Normal rate, regular rhythm and normal heart sounds.   Pulmonary/Chest: Effort normal and breath sounds normal.  Neurological: She is alert and oriented to person, place, and time.  Skin: Skin is warm and dry.  Psychiatric: She has a normal mood and affect. Her behavior is normal.          Assessment & Plan:  Abnormal weight gain - will restart phentermine.F/U in 1 month for nurse BP and weigh check.     Fibromyalgia -  Stable continue current regimen. Phentermine will help with energy level.  Has been going to  exercise class 2-3 days a week with her daughters.  Follow-up in 3 months.  IBS/constipation - she hydrates well. Discussed options including Amitiza and Linzess. She may need to take something to get her bowels moving initially before starting the medication. It looks like Linzess is covered on her insurance plan. We'll start with this medication. Coupon card provided. Take once a day. About 30 minutes before first meal of the day. Follow-up in 3 months.  Neck pain-  Improving.  Continue to work on stretches.

## 2014-09-16 ENCOUNTER — Other Ambulatory Visit: Payer: Self-pay | Admitting: Family Medicine

## 2014-09-16 MED ORDER — LINACLOTIDE 290 MCG PO CAPS: 290.0000 ug | ORAL_CAPSULE | Freq: Every day | ORAL | Status: DC

## 2014-09-16 NOTE — Telephone Encounter (Signed)
Per insurance, and savings card, requeted 90 day supply.

## 2014-10-12 ENCOUNTER — Ambulatory Visit: Payer: BC Managed Care – PPO

## 2014-10-14 ENCOUNTER — Ambulatory Visit (INDEPENDENT_AMBULATORY_CARE_PROVIDER_SITE_OTHER): Payer: BC Managed Care – PPO | Admitting: Family Medicine

## 2014-10-14 VITALS — BP 120/74 | HR 72 | Wt 139.0 lb

## 2014-10-14 DIAGNOSIS — R635 Abnormal weight gain: Secondary | ICD-10-CM | POA: Diagnosis not present

## 2014-10-14 MED ORDER — PHENTERMINE HCL 37.5 MG PO CAPS: 37.5000 mg | ORAL_CAPSULE | ORAL | Status: DC

## 2014-10-14 NOTE — Progress Notes (Signed)
   Subjective:    Patient ID: Marlowe AltKathy M Bhat, female    DOB: 10-18-1965, 49 y.o.   MRN: [id number]  HPI    Review of Systems     Objective:   Physical Exam        Assessment & Plan:  Abnormal weight gain-agree with note below. She's lost 2 more pounds. Will refill medication today.  Nani Gasseratherine Johnanthony Wilden, MD

## 2014-10-14 NOTE — Progress Notes (Signed)
   Subjective:    Patient ID: Carol Diaz, female    DOB: Jun 10, 1965, 49 y.o.   MRN: [id number]  HPI  Patient is here for blood pressure and weight check. Denies any trouble sleeping, palpitations, or any other medication problems. Pt states she has made no dietary or exercise changes, she has just been taking the Phentermine.    Review of Systems     Objective:   Physical Exam        Assessment & Plan:  Patient has lost weight. A refill for Phentermine will be sent to patient preferred pharmacy. Patient advised to schedule a four week nurse visit and keep her upcoming appointment with her PCP. Verbalized understanding, no further questions.

## 2014-11-13 ENCOUNTER — Ambulatory Visit (INDEPENDENT_AMBULATORY_CARE_PROVIDER_SITE_OTHER): Payer: BC Managed Care – PPO | Admitting: Family Medicine

## 2014-11-13 VITALS — BP 128/74 | HR 74 | Wt 138.0 lb

## 2014-11-13 DIAGNOSIS — R635 Abnormal weight gain: Secondary | ICD-10-CM

## 2014-11-13 MED ORDER — PHENTERMINE HCL 37.5 MG PO CAPS: 37.5000 mg | ORAL_CAPSULE | ORAL | Status: DC

## 2014-11-13 NOTE — Progress Notes (Signed)
   Subjective:    Patient ID: Carol Diaz, female    DOB: Jan 28, 1966, 49 y.o.   MRN: [id number]  HPI  ULLA MCKIERNAN is here for blood pressure and weight check. Diet and exercise is going well. Denies trouble sleeping or palpitations.   Review of Systems     Objective:   Physical Exam        Assessment & Plan:  Abnormal weight loss - Patient has not lost weight. A refill will be faxed to the pharmacy.    Abnormal weight gain. She's only lost 1 pound. We'll continue to monitor. Medication refill today. Really needs to make sure that she is tingling to watch her diet and get regular exercise to boost her metabolism.   Nani Gasser, MD

## 2014-12-08 ENCOUNTER — Other Ambulatory Visit: Payer: Self-pay | Admitting: Family Medicine

## 2014-12-11 ENCOUNTER — Encounter: Payer: Self-pay | Admitting: Family Medicine

## 2014-12-11 ENCOUNTER — Ambulatory Visit (INDEPENDENT_AMBULATORY_CARE_PROVIDER_SITE_OTHER): Payer: BC Managed Care – PPO | Admitting: Family Medicine

## 2014-12-11 VITALS — BP 129/82 | HR 72 | Wt 142.0 lb

## 2014-12-11 DIAGNOSIS — Z23 Encounter for immunization: Secondary | ICD-10-CM

## 2014-12-11 DIAGNOSIS — M79605 Pain in left leg: Secondary | ICD-10-CM

## 2014-12-11 DIAGNOSIS — M7711 Lateral epicondylitis, right elbow: Secondary | ICD-10-CM

## 2014-12-11 DIAGNOSIS — S161XXA Strain of muscle, fascia and tendon at neck level, initial encounter: Secondary | ICD-10-CM | POA: Diagnosis not present

## 2014-12-11 MED ORDER — NABUMETONE 500 MG PO TABS: 500.0000 mg | ORAL_TABLET | Freq: Two times a day (BID) | ORAL | Status: DC | PRN

## 2014-12-11 NOTE — Progress Notes (Signed)
Subjective:    Patient ID: Carol Diaz, female    DOB: 1965-12-05, 49 y.o.   MRN: [id number]  HPI Leg Pain x 2 wks pt stated that her L leg tingles. Starts at midthigh down to her foot.  Mostly happens when lays down to go to sleep. She turns a lot while sleeping.    Neck Pain  - neck pain has gotten worse over past 2 wks she states that it is hard for her to turn her head to the R and the is really bad when she is driving and trying to check her blind spot.she has been using some Aleve, heat and stretches. Pain radiates down to her mid back. Hasn't been able to exercise. No injury per se. No trauma.  Missed work on Monday.     Still havign pain over her right lateral elbow. We have previous ago diagnosed her with lateral epicondylitis. She's been wearing the tennis elbowstrap in doing the stretches and icing it and really hasn't gotten a lot of relief. 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 She has really readjusted her workstation to make it more ergonomic but is still having problems.   Review of Systems     Objective:   Physical Exam  Constitutional: She is oriented to person, place, and time. She appears well-developed and well-nourished.  HENT:  Head: Normocephalic and atraumatic.  Cardiovascular: Normal rate, regular rhythm and normal heart sounds.   Pulmonary/Chest: Effort normal and breath sounds normal.  Musculoskeletal:  Neck was decreased flexion and normal extension. Slightly decreased rotation to the right compared the left. Slightly decreased bending to the right compared to the left. Nontender over the cervical spine or thoracic spine. That she has a lot of tightness and spasm in the bilateral trapezius muscles as well as the rhomboids between the spine and the scapula bilaterally.  Neurological: She is alert and oriented to person, place, and time.  Skin:  Skin is warm and dry.  Psychiatric: She has a normal mood and affect. Her behavior is normal.        Assessment & Plan:  Cervical strain- She is not responding to muscle relaxer, icing and stretches.   I  would recommend a massage. She hasn't done well with physical therapy in the past with her fiber myalgia with a little too aggressive. I think she would do very well with a light massage. She's never had that done. We'll also add Relafen for pain relief to her regimen.   Right lateral epicondylitis-discussed options including injection. Discussed potential side effects including loss of pigmentation and atrophy. Patient would like to move forward with injection.  Leg pain - sound like it is positional. Consider xray of lumbar spine.    Aspiration/Injection Procedure Note SYLVIA HELMS [id number] 03/29/1965  Procedure: injection, lateral epicondylitis  Indications: pain, despite conservative tx.    Procedure Details Consent: Risks of procedure as well as the alternatives and risks of each were explained to the (patient/caregiver).  Consent for procedure obtained. Time Out: Verified patient identification, verified procedure, site/side was marked, verified correct patient position, special equipment/implants available, medications/allergies/relevent history reviewed, required imaging and test results available.  Performed   Local Anesthesia Used:Lidocaine 1% plain; 1mL Amount of Fluid injected : 1 ml of  Depo - Medrol and 1ml of 1% lidocaine A sterile dressing was applied.  Patient did tolerate procedure well. Estimated blood loss: 0  METHENEY,CATHERINE 12/11/2014, 2:20 PM

## 2014-12-13 ENCOUNTER — Other Ambulatory Visit: Payer: Self-pay | Admitting: Family Medicine

## 2014-12-17 ENCOUNTER — Telehealth: Payer: Self-pay | Admitting: Family Medicine

## 2014-12-17 NOTE — Telephone Encounter (Signed)
Pt is requesting a refill on her Phenteramine. Pt states "I was just in for an office visit on 12/11/2014....do I need to come in again?" When Pt was seen on 11/13/14 and weighed 138lbs, then on her 12/11/14 appt she weighed 142lbs.

## 2014-12-18 MED ORDER — PHENTERMINE HCL 37.5 MG PO CAPS: 37.5000 mg | ORAL_CAPSULE | ORAL | Status: DC

## 2014-12-18 NOTE — Addendum Note (Signed)
Addended by: Nani Gasser D on: 12/18/2014 02:56 PM   Modules accepted: Orders

## 2014-12-18 NOTE — Telephone Encounter (Signed)
Please let May should know that we will fax over the prescription. Follow-up in one month for blood pressure and weight check.

## 2014-12-28 ENCOUNTER — Encounter: Payer: Self-pay | Admitting: Family Medicine

## 2014-12-28 NOTE — Telephone Encounter (Signed)
To Dr. Metheney 

## 2015-01-13 ENCOUNTER — Ambulatory Visit: Payer: BC Managed Care – PPO | Admitting: Family Medicine

## 2015-01-15 ENCOUNTER — Encounter: Payer: Self-pay | Admitting: Family Medicine

## 2015-01-15 ENCOUNTER — Ambulatory Visit (INDEPENDENT_AMBULATORY_CARE_PROVIDER_SITE_OTHER): Payer: BC Managed Care – PPO | Admitting: Family Medicine

## 2015-01-15 ENCOUNTER — Other Ambulatory Visit: Payer: Self-pay | Admitting: Family Medicine

## 2015-01-15 VITALS — BP 122/74 | HR 75 | Temp 98.3°F | Resp 16 | Wt 142.8 lb

## 2015-01-15 DIAGNOSIS — G43009 Migraine without aura, not intractable, without status migrainosus: Secondary | ICD-10-CM

## 2015-01-15 DIAGNOSIS — S161XXA Strain of muscle, fascia and tendon at neck level, initial encounter: Secondary | ICD-10-CM

## 2015-01-15 MED ORDER — NABUMETONE 500 MG PO TABS: 500.0000 mg | ORAL_TABLET | Freq: Two times a day (BID) | ORAL | Status: DC | PRN

## 2015-01-15 MED ORDER — PHENTERMINE HCL 37.5 MG PO CAPS: 37.5000 mg | ORAL_CAPSULE | ORAL | Status: DC

## 2015-01-15 MED ORDER — GABAPENTIN 100 MG PO CAPS: ORAL_CAPSULE | ORAL | Status: DC

## 2015-01-15 MED ORDER — DULOXETINE HCL 30 MG PO CPEP: ORAL_CAPSULE | ORAL | Status: DC

## 2015-01-15 MED ORDER — PROPRANOLOL HCL ER 60 MG PO CP24: ORAL_CAPSULE | ORAL | Status: DC

## 2015-01-15 MED ORDER — SUMATRIPTAN SUCCINATE 100 MG PO TABS: 100.0000 mg | ORAL_TABLET | ORAL | Status: DC | PRN

## 2015-01-15 NOTE — Progress Notes (Signed)
   Subjective:    Patient ID: Carol Diaz, female    DOB: 1965/03/28, 49 y.o.   MRN: [id number]  HPI She is in pain daily with her neck. Even this morning she's having a lot of stiffness and burning.. She is taiking the relafen in the evening. Does help some. She's been working on doing her stretches. She's been trying to exercise consistently about 3 days per week. She did go for a light massage once since I last saw her. She had increase in pain for about a day afterwards and did get some temporary relief for couple of days. He is hesitant to do it again because of increased pain. She's not done well with formal PT in the past.  Migraine headaches-doing well with Imitrex. Does need a refill today. Also needs refill on propranolol which she takes for prophylaxis.  Review of Systems     Objective:   Physical Exam  Constitutional: She appears well-developed and well-nourished.  Musculoskeletal:  Decreased cervical flexion and decreased extension. Decreased rotation right and left as well as side bending but it is symmetric. She's tender over the base of the cervical spine as well as a possible bilaterally. She's also tender over both trapezius muscles. Shoulders with normal range of motion. Reflexes 2+ in upper and lower extremities bilaterally.  Skin: Skin is warm and dry.  Psychiatric: She has a normal mood and affect.        Assessment & Plan:  Cervical pain with bilateral trapezius pain-discussed options. She was on Cymbalta for almost a year about a year ago. Not exactly clear why we stopped at the time that she doesn't remove anything negative about it any side effects documented in the chart. Discussed restarting the medication to help with daily baseline pain. In addition discuss dry needling as an option. Can use muscle relaxer as needed.   Migraine headaches-refill propranolol and Imitrex.

## 2015-01-15 NOTE — Telephone Encounter (Signed)
Both of these Rx's were addressed at today's OV with PCP.

## 2015-01-27 ENCOUNTER — Ambulatory Visit: Payer: BC Managed Care – PPO | Admitting: Family Medicine

## 2015-02-09 ENCOUNTER — Encounter: Payer: Self-pay | Admitting: Family Medicine

## 2015-02-11 MED ORDER — LEVONORGEST-ETH ESTRAD 91-DAY 0.15-0.03 MG PO TABS: 1.0000 | ORAL_TABLET | Freq: Every day | ORAL | Status: DC

## 2015-02-25 ENCOUNTER — Other Ambulatory Visit: Payer: Self-pay | Admitting: Family Medicine

## 2015-02-25 ENCOUNTER — Encounter: Payer: Self-pay | Admitting: Family Medicine

## 2015-02-25 ENCOUNTER — Ambulatory Visit (INDEPENDENT_AMBULATORY_CARE_PROVIDER_SITE_OTHER): Payer: BC Managed Care – PPO | Admitting: Family Medicine

## 2015-02-25 VITALS — BP 126/78 | HR 120 | Temp 97.8°F | Resp 18 | Wt 150.2 lb

## 2015-02-25 DIAGNOSIS — J019 Acute sinusitis, unspecified: Secondary | ICD-10-CM

## 2015-02-25 MED ORDER — AMOXICILLIN-POT CLAVULANATE 875-125 MG PO TABS: 1.0000 | ORAL_TABLET | Freq: Two times a day (BID) | ORAL | Status: DC

## 2015-02-25 NOTE — Progress Notes (Signed)
   Subjective:    Patient ID: Carol Diaz, female    DOB: 01/04/1966, 49 y.o.   MRN: [id number]  HPI 2 weeks with sinus congestion. And now feeling some tightness in her chest. Painful to cough.  Feels very weak today. She feels like she is getting worse. . No fever or sore throat.  Bilat maxillary tenderness on face.  Headaches with it.  Taking Alkeseltzer plus. Her husband has been sick as well.   Review of Systems     Objective:   Physical Exam  Constitutional: She is oriented to person, place, and time. She appears well-developed and well-nourished.  HENT:  Head: Normocephalic and atraumatic.  Right Ear: External ear normal.  Left Ear: External ear normal.  Nose: Nose normal.  Mouth/Throat: Oropharynx is clear and moist.  TMs and canals are clear. Tenderness over the maxillary sinuses.  Eyes: Conjunctivae and EOM are normal. Pupils are equal, round, and reactive to light.  Neck: Neck supple. No thyromegaly present.  Cardiovascular: Normal rate, regular rhythm and normal heart sounds.   Pulmonary/Chest: Effort normal and breath sounds normal. She has no wheezes.  Lymphadenopathy:    She has no cervical adenopathy.  Neurological: She is alert and oriented to person, place, and time.  Skin: Skin is warm and dry.  Psychiatric: She has a normal mood and affect.          Assessment & Plan:  Acute sinusitis - will tx with Augmentin. Call if not better in one week. F/U if needed. Ok to continue symptomatic care.  Work note for today and tomorrow.

## 2015-02-25 NOTE — Patient Instructions (Signed)

## 2015-02-26 ENCOUNTER — Ambulatory Visit: Payer: BC Managed Care – PPO | Admitting: Family Medicine

## 2015-03-03 ENCOUNTER — Other Ambulatory Visit: Payer: Self-pay | Admitting: Family Medicine

## 2015-03-10 ENCOUNTER — Other Ambulatory Visit: Payer: Self-pay

## 2015-03-10 MED ORDER — PHENTERMINE HCL 37.5 MG PO CAPS: 37.5000 mg | ORAL_CAPSULE | ORAL | Status: DC

## 2015-03-18 ENCOUNTER — Other Ambulatory Visit: Payer: Self-pay | Admitting: Family Medicine

## 2015-04-12 ENCOUNTER — Encounter: Payer: Self-pay | Admitting: Family Medicine

## 2015-04-12 ENCOUNTER — Ambulatory Visit (INDEPENDENT_AMBULATORY_CARE_PROVIDER_SITE_OTHER): Payer: BC Managed Care – PPO | Admitting: Family Medicine

## 2015-04-12 VITALS — BP 132/85 | HR 73 | Wt 149.0 lb

## 2015-04-12 DIAGNOSIS — M7711 Lateral epicondylitis, right elbow: Secondary | ICD-10-CM

## 2015-04-12 DIAGNOSIS — R635 Abnormal weight gain: Secondary | ICD-10-CM

## 2015-04-12 DIAGNOSIS — S161XXD Strain of muscle, fascia and tendon at neck level, subsequent encounter: Secondary | ICD-10-CM

## 2015-04-12 DIAGNOSIS — N898 Other specified noninflammatory disorders of vagina: Secondary | ICD-10-CM

## 2015-04-12 LAB — WET PREP FOR TRICH, YEAST, CLUE
Clue Cells Wet Prep HPF POC: NONE SEEN
Trich, Wet Prep: NONE SEEN
WBC WET PREP: NONE SEEN
YEAST WET PREP: NONE SEEN

## 2015-04-12 MED ORDER — PHENTERMINE HCL 37.5 MG PO CAPS: 37.5000 mg | ORAL_CAPSULE | ORAL | Status: DC

## 2015-04-12 MED ORDER — FLUCONAZOLE 150 MG PO TABS: 150.0000 mg | ORAL_TABLET | Freq: Once | ORAL | Status: DC

## 2015-04-12 NOTE — Addendum Note (Signed)
Addended by: Nani GasserMETHENEY, CATHERINE D on: 04/12/2015 12:09 PM   Modules accepted: Orders

## 2015-04-12 NOTE — Progress Notes (Signed)
   Subjective:    Patient ID: Carol Diaz Decoste, female    DOB: May 01, 1965, 50 y.o.   MRN: [id number]  HPI F/U right lat epidcondylitis.  Still having a lot of pain in the right elbo. Still very sensitive. Shot in Sept lasted 6 weeks. Tender. Someday hard to write bc radiates down her arm. She is interested in another injection today.  Sometimes exercise aggrevates her elbow. Pain is on the outside.   Her neck has felt better.  She has been trying to be careful.   She thinks she may have a yeast infection.  She is having some itching and some change in Vaginal d/c.  Started about 4 days ago.  She was on course on Augmentni about a month ago. She has been using an OTC cream.    Abnormal weight gain - she is trying to do aerobic exercise 4 days per week. She is currently on phentermine. No chest pain or palpitations or jitteriness. She has had some sleep issues in the last couple of weeks but up until that point she's been tolerating it well.   Review of Systems     Objective:   Physical Exam  Constitutional: She is oriented to person, place, and time. She appears well-developed and well-nourished.  HENT:  Head: Normocephalic and atraumatic.  Eyes: Conjunctivae are normal. Pupils are equal, round, and reactive to light.  Cardiovascular: Normal rate, regular rhythm and normal heart sounds.   Pulmonary/Chest: Effort normal and breath sounds normal.  Musculoskeletal:  Very tender over the right epicondyle. Some pain with pronation and supination. She does appear to have some mild swelling in the area as well. But no erythema.  Neurological: She is alert and oriented to person, place, and time.  Skin: Skin is warm and dry.  Psychiatric: She has a normal mood and affect. Her behavior is normal.          Assessment & Plan:  Right lat epicondylitis - injection done as below. Ice today. Warned about potential for atrophy and loss of pigment.  May want to rub the area with an Ace wrap during  exercise so that she doesn't ever flex and extend it.  Cervical strain - improved.  Continue to work on gentle stretches.  Vaginitis. Wet prep performed but may be affected by the cream she is using.   Abnormal weight gain-she has lost 1 pound. Did refill the phentermine today. Continue work on diet and exercise.  Aspiration/Injection Procedure Note Carol Diaz Gazzola [id number] May 01, 1965  Procedure: Injection Indications: pain  Procedure Details Consent: Risks of procedure as well as the alternatives and risks of each were explained to the (patient/caregiver).  Consent for procedure obtained. Time Out: Verified patient identification, verified procedure, site/side was marked, verified correct patient position, special equipment/implants available, medications/allergies/relevent history reviewed, required imaging and test results available.  Performed   Local Anesthesia Used:Ethyl Chloride Spray Amount of Fluid Aspirated: none Injection  1 cc of 40mg  kenalog and cc of 1% lidocaine  A sterile dressing was applied.  Patient did tolerate procedure well. Estimated blood loss: 0  METHENEY,CATHERINE 04/12/2015, 9:37 AM

## 2015-04-20 ENCOUNTER — Other Ambulatory Visit: Payer: Self-pay | Admitting: Family Medicine

## 2015-04-27 ENCOUNTER — Other Ambulatory Visit: Payer: Self-pay | Admitting: Family Medicine

## 2015-04-28 LAB — HM MAMMOGRAPHY

## 2015-05-04 ENCOUNTER — Encounter: Payer: Self-pay | Admitting: Family Medicine

## 2015-05-27 ENCOUNTER — Ambulatory Visit (INDEPENDENT_AMBULATORY_CARE_PROVIDER_SITE_OTHER): Payer: BC Managed Care – PPO | Admitting: Family Medicine

## 2015-05-27 ENCOUNTER — Encounter: Payer: Self-pay | Admitting: Family Medicine

## 2015-05-27 VITALS — BP 131/77 | HR 76 | Temp 98.3°F | Wt 152.0 lb

## 2015-05-27 DIAGNOSIS — J029 Acute pharyngitis, unspecified: Secondary | ICD-10-CM

## 2015-05-27 DIAGNOSIS — R635 Abnormal weight gain: Secondary | ICD-10-CM | POA: Diagnosis not present

## 2015-05-27 DIAGNOSIS — J069 Acute upper respiratory infection, unspecified: Secondary | ICD-10-CM | POA: Diagnosis not present

## 2015-05-27 MED ORDER — PROPRANOLOL HCL ER 60 MG PO CP24: ORAL_CAPSULE | ORAL | Status: DC

## 2015-05-27 MED ORDER — LORCASERIN HCL 10 MG PO TABS: 1.0000 | ORAL_TABLET | Freq: Two times a day (BID) | ORAL | Status: DC

## 2015-05-27 MED ORDER — SUMATRIPTAN SUCCINATE 100 MG PO TABS: 100.0000 mg | ORAL_TABLET | ORAL | Status: DC | PRN

## 2015-05-27 NOTE — Progress Notes (Signed)
   Subjective:    Patient ID: Carol Diaz, female    DOB: 07/15/1965, 50 y.o.   MRN: [id number]  HPI She had fever 99.6 starting yesterday. Has had ST, Headache, and cough as well for about 2 days. Using Tylenol.  Cough is dry and hacking.  No nasal congestion.  No runny nose. No Gi sxs.    Abnormal weight gain/BMI 24 she really feels like the phentermine is not working well anymore. She says when she first started it it was working great. Like to consider an alternative agent. I'm also concerned because she's been on the phentermine for quite some time and she tends to gain and lose from month to month. I discussed with her that this medication has not been studied long-term and it may be switched safer to switch to one of the newer agents.  Review of Systems     Objective:   Physical Exam  Constitutional: She is oriented to person, place, and time. She appears well-developed and well-nourished.  HENT:  Head: Normocephalic and atraumatic.  Right Ear: External ear normal.  Left Ear: External ear normal.  Nose: Nose normal.  Mouth/Throat: Oropharynx is clear and moist.  TMs and canals are clear.   Eyes: Conjunctivae and EOM are normal. Pupils are equal, round, and reactive to light.  Neck: Neck supple. No thyromegaly present.  Cardiovascular: Normal rate, regular rhythm and normal heart sounds.   Pulmonary/Chest: Effort normal and breath sounds normal. She has no wheezes.  Lymphadenopathy:    She has no cervical adenopathy.  Neurological: She is alert and oriented to person, place, and time.  Skin: Skin is warm and dry.  Psychiatric: She has a normal mood and affect.        Assessment & Plan:  URI - neg strep test.  Likely viral. Symptomatic care. Work note provided. Says not quite meet criteria for flu.   Abnormal weight gain  - discussed possibly trying Belviq.  It does look like it's covered on her insurance plan. New prescription sent to the pharmacy. She can follow-up in  6 weeks.

## 2015-05-28 ENCOUNTER — Telehealth: Payer: Self-pay | Admitting: Family Medicine

## 2015-05-28 NOTE — Telephone Encounter (Signed)
Received fax for prior authorization on Belviq sent through cover my meds waiting on authorization. - CF °

## 2015-06-01 ENCOUNTER — Encounter: Payer: Self-pay | Admitting: Family Medicine

## 2015-06-01 ENCOUNTER — Encounter: Payer: Self-pay | Admitting: *Deleted

## 2015-06-01 NOTE — Telephone Encounter (Signed)
Patient sent an email inquiring about belviq. Called insurance and checked the status and it is still under review. I sent the patient an email to let her know the status

## 2015-06-03 NOTE — Telephone Encounter (Signed)
Received fax from [facility] and they denied coverage on Belviq due to patient did not meet BMI requirements. - CF

## 2015-06-04 ENCOUNTER — Telehealth: Payer: Self-pay | Admitting: Family Medicine

## 2015-06-04 NOTE — Telephone Encounter (Signed)
Please call patient: We did try to get the authorization for her Belviq with her insurance company. Unfortunately they will not pay for it and less her BMI is 30 or higher. Since her BMI is 24 she doesn't qualify. Can always consider getting her in with a nutritionist.

## 2015-06-04 NOTE — Telephone Encounter (Signed)
Pt informed.Shelsea Hangartner [name]  

## 2015-06-07 ENCOUNTER — Encounter: Payer: Self-pay | Admitting: Family Medicine

## 2015-06-07 ENCOUNTER — Ambulatory Visit (INDEPENDENT_AMBULATORY_CARE_PROVIDER_SITE_OTHER): Payer: BC Managed Care – PPO | Admitting: Family Medicine

## 2015-06-07 VITALS — BP 132/69 | Wt 155.0 lb

## 2015-06-07 DIAGNOSIS — Z0000 Encounter for general adult medical examination without abnormal findings: Secondary | ICD-10-CM | POA: Diagnosis not present

## 2015-06-07 DIAGNOSIS — R635 Abnormal weight gain: Secondary | ICD-10-CM | POA: Diagnosis not present

## 2015-06-07 DIAGNOSIS — M542 Cervicalgia: Secondary | ICD-10-CM | POA: Diagnosis not present

## 2015-06-07 LAB — CBC WITH DIFFERENTIAL/PLATELET
BASOS ABS: 0 10*3/uL (ref 0.0–0.1)
Basophils Relative: 0 % (ref 0–1)
Eosinophils Absolute: 0.1 10*3/uL (ref 0.0–0.7)
Eosinophils Relative: 1 % (ref 0–5)
HCT: 41.5 % (ref 36.0–46.0)
Hemoglobin: 14.1 g/dL (ref 12.0–15.0)
LYMPHS ABS: 2.1 10*3/uL (ref 0.7–4.0)
LYMPHS PCT: 20 % (ref 12–46)
MCH: 30.9 pg (ref 26.0–34.0)
MCHC: 34 g/dL (ref 30.0–36.0)
MCV: 90.8 fL (ref 78.0–100.0)
MPV: 9.1 fL (ref 8.6–12.4)
Monocytes Absolute: 0.6 10*3/uL (ref 0.1–1.0)
Monocytes Relative: 6 % (ref 3–12)
Neutro Abs: 7.5 10*3/uL (ref 1.7–7.7)
Neutrophils Relative %: 73 % (ref 43–77)
PLATELETS: 320 10*3/uL (ref 150–400)
RBC: 4.57 MIL/uL (ref 3.87–5.11)
RDW: 13.3 % (ref 11.5–15.5)
WBC: 10.3 10*3/uL (ref 4.0–10.5)

## 2015-06-07 LAB — COMPLETE METABOLIC PANEL WITH GFR
ALBUMIN: 3.7 g/dL (ref 3.6–5.1)
ALK PHOS: 55 U/L (ref 33–115)
ALT: 16 U/L (ref 6–29)
AST: 16 U/L (ref 10–35)
BILIRUBIN TOTAL: 0.3 mg/dL (ref 0.2–1.2)
BUN: 19 mg/dL (ref 7–25)
CALCIUM: 9.2 mg/dL (ref 8.6–10.2)
CO2: 24 mmol/L (ref 20–31)
Chloride: 106 mmol/L (ref 98–110)
Creat: 1.03 mg/dL (ref 0.50–1.10)
GFR, Est African American: 74 mL/min (ref >=60)
GFR, Est Non African American: 64 mL/min (ref >=60)
Glucose, Bld: 72 mg/dL (ref 65–99)
Potassium: 4.4 mmol/L (ref 3.5–5.3)
Sodium: 140 mmol/L (ref 135–146)
Total Protein: 6.6 g/dL (ref 6.1–8.1)

## 2015-06-07 LAB — LIPID PANEL
CHOLESTEROL: 240 mg/dL — AB (ref 125–200)
HDL: 46 mg/dL (ref >=46)
LDL Cholesterol: 163 mg/dL — ABNORMAL HIGH (ref <130)
Total CHOL/HDL Ratio: 5.2 Ratio — ABNORMAL HIGH (ref <=5.0)
Triglycerides: 157 mg/dL — ABNORMAL HIGH (ref <150)
VLDL: 31 mg/dL — ABNORMAL HIGH (ref <30)

## 2015-06-07 LAB — TSH: TSH: 1.6 m[IU]/L

## 2015-06-07 MED ORDER — METFORMIN HCL 500 MG PO TABS: 500.0000 mg | ORAL_TABLET | Freq: Two times a day (BID) | ORAL | Status: DC

## 2015-06-07 NOTE — Progress Notes (Signed)
  Subjective:     Carol Diaz is a 50 y.o. female and is here for a comprehensive physical exam. The patient reports no problems.  Social History   Social History  . Marital Status: Married    Spouse Name: N/A  . Number of Children: 2  . Years of Education: N/A   Occupational History  .     Social History Main Topics  . Smoking status: Never Smoker   . Smokeless tobacco: Never Used  . Alcohol Use: No  . Drug Use: No  . Sexual Activity:    Partners: Male    Birth Control/ Protection: Pill   Other Topics Concern  . Not on file   Social History Narrative   Some exercise.    Health Maintenance  Topic Date Due  . HIV Screening  01/15/2016 (Originally 03/21/1981)  . INFLUENZA VACCINE  10/26/2015  . MAMMOGRAM  04/27/2016  . PAP SMEAR  01/26/2017  . TETANUS/TDAP  03/15/2020    The following portions of the patient's history were reviewed and updated as appropriate: allergies, current medications, past family history, past medical history, past social history, past surgical history and problem list.  Review of Systems A comprehensive review of systems was negative.   Objective:    BP 132/69 mmHg  Wt 155 lb (70.308 kg)  SpO2 100% General appearance: alert, cooperative and appears older than stated age Head: Normocephalic, without obvious abnormality, atraumatic Eyes: conj clear, ROMI, pEERLA Ears: normal TM's and external ear canals both ears Nose: Nares normal. Septum midline. Mucosa normal. No drainage or sinus tenderness. Throat: lips, mucosa, and tongue normal; teeth and gums normal Neck: no adenopathy, no carotid bruit, no JVD, supple, symmetrical, trachea midline and thyroid not enlarged, symmetric, no tenderness/mass/nodules Back: symmetric, no curvature. ROM normal. No CVA tenderness. Lungs: clear to auscultation bilaterally Breasts: normal appearance, no masses or tenderness, deferred Heart: regular rate and rhythm, S1, S2 normal, no murmur, click, rub or  gallop Abdomen: soft, non-tender; bowel sounds normal; no masses,  no organomegaly Pelvic: deferred Extremities: extremities normal, atraumatic, no cyanosis or edema Pulses: 2+ and symmetric Skin: Skin color, texture, turgor normal. No rashes or lesions Lymph nodes: Cervical adenopathy: nl and Axillary adenopathy: nl Neurologic: Alert and oriented X 3, normal strength and tone. Normal symmetric reflexes. Normal coordination and gait    Assessment:    Healthy female exam.      Plan:     See After Visit Summary for Counseling Recommendations  Keep up a regular exercise program and make sure you are eating a healthy diet Try to eat 4 servings of dairy a day, or if you are lactose intolerant take a calcium with vitamin D daily.  Your vaccines are up to date.   Abnormal weight gain-unfortunately the Belviq was not covered with her insurance plan because her BMI was not greater than 30. We discussed other options including starting with low-dose metformin. We'll start this medication. If she tolerates it well and she can increase to twice a day after 1-2 weeks. I'll up in 6-8 weeks.

## 2015-06-07 NOTE — Patient Instructions (Addendum)
Keep up a regular exercise program and make sure you are eating a healthy diet Try to eat 4 servings of dairy a day, or if you are lactose intolerant take a calcium with vitamin D daily.  Your vaccines are up to date.   With the metformin start with 1 tab daily for 2 weeks. Take first thing in the morning. If tolerated well and increase to twice a day.

## 2015-06-08 LAB — VITAMIN D 25 HYDROXY (VIT D DEFICIENCY, FRACTURES): Vit D, 25-Hydroxy: 33 ng/mL (ref 30–100)

## 2015-06-10 ENCOUNTER — Encounter: Payer: Self-pay | Admitting: Family Medicine

## 2015-06-10 ENCOUNTER — Ambulatory Visit (INDEPENDENT_AMBULATORY_CARE_PROVIDER_SITE_OTHER): Payer: BC Managed Care – PPO

## 2015-06-10 ENCOUNTER — Ambulatory Visit (INDEPENDENT_AMBULATORY_CARE_PROVIDER_SITE_OTHER): Payer: BC Managed Care – PPO | Admitting: Family Medicine

## 2015-06-10 VITALS — BP 134/81 | HR 65 | Wt 156.0 lb

## 2015-06-10 DIAGNOSIS — M542 Cervicalgia: Secondary | ICD-10-CM

## 2015-06-10 MED ORDER — DULOXETINE HCL 60 MG PO CPEP: 60.0000 mg | ORAL_CAPSULE | Freq: Two times a day (BID) | ORAL | Status: DC

## 2015-06-10 NOTE — Assessment & Plan Note (Signed)
  Likely due to a combination of DDD, fibromyalgia, myofascial spasm and pain. Plan for trial of physical therapy, increasing dose of Cymbalta, continued may be tone and Flexeril, and trial of TENS unit. Additionally recommend cognitive behavioral therapy. Recheck in about a month

## 2015-06-10 NOTE — Patient Instructions (Signed)
Thank you for coming in today. Increase cymbalta to 60mg  twice daily.  TENS UNIT: This is helpful for muscle pain and spasm.   Search and Purchase a TENS 7000 2nd edition at www.tenspros.com. It should be less than $30.     TENS unit instructions: Do not shower or bathe with the unit on Turn the unit off before removing electrodes or batteries If the electrodes lose stickiness add a drop of water to the electrodes after they are disconnected from the unit and place on plastic sheet. If you continued to have difficulty, call the TENS unit company to purchase more electrodes. Do not apply lotion on the skin area prior to use. Make sure the skin is clean and dry as this will help prolong the life of the electrodes. After use, always check skin for unusual red areas, rash or other skin difficulties. If there are any skin problems, does not apply electrodes to the same area. Never remove the electrodes from the unit by pulling the wires. Do not use the TENS unit or electrodes other than as directed. Do not change electrode placement without consultating your therapist or physician. Keep 2 fingers with between each electrode. Wear time ratio is 2:1, on to off times.    For example on for 30 minutes off for 15 minutes and then on for 30 minutes off for 15 minutes  Attend PT.   Consider CBT.   [facility]  [address], [address]  [place], [place] [address]  ???PHONE: [phone] FAX: [phone] EMAIL: [email or web address]

## 2015-06-10 NOTE — Progress Notes (Signed)
   Subjective:    I'm seeing this patient as a consultation for:  Dr. Linford ArnoldMetheney  CC: Neck pain  HPI: Patient has a 1-2 year history of upper neck pain. She denies any specific injury. The pain is located at the posterior neck into the base of the skull. She denies any radiating pain weakness or numbness. She takes nabumetone, and Flexeril which does help. Additionally she has fibromyalgia for which she takes Cymbalta. She had one treatment of physical therapy that made symptoms worse. She does not repeat treatments. She has not had recent x-rays.  Past medical history, Surgical history, Family history not pertinant except as noted below, Social history, Allergies, and medications have been entered into the medical record, reviewed, and no changes needed.   Review of Systems: No headache, visual changes, nausea, vomiting, diarrhea, constipation, dizziness, abdominal pain, skin rash, fevers, chills, night sweats, weight loss, swollen lymph nodes, body aches, joint swelling, muscle aches, chest pain, shortness of breath, mood changes, visual or auditory hallucinations.   Objective:    Filed Vitals:   06/10/15 0817  BP: 134/81  Pulse: 65   General: Well Developed, well nourished, and in no acute distress.  Neuro/Psych: Alert and oriented x3, extra-ocular muscles intact, able to move all 4 extremities, sensation grossly intact. Skin: Warm and dry, no rashes noted.  Respiratory: Not using accessory muscles, speaking in full sentences, trachea midline.  Cardiovascular: Pulses palpable, no extremity edema. Abdomen: Does not appear distended. MSK: Neck: Nontender to midline. Mildly tender to palpation bilateral cervical paraspinals. Range of motion: Normal extension impaired flexion by about 10. Impaired rotation bilaterally by about 5 Impaired lateral flexion by 30 bilaterally. Negative Spurling's test. Upper extremity strength is equal and normal throughout. Reflexes are equal and normal  bilaterally.  X-ray C-spine: DDD C1-C2. Otherwise relatively normal. Awaiting formal radiology read.   No results found for this or any previous visit (from the past 24 hour(s)). No results found.  Impression and Recommendations:   This case required medical decision making of moderate complexity.

## 2015-06-11 NOTE — Progress Notes (Signed)
Quick Note:  Normal, no changes. ______ 

## 2015-06-12 ENCOUNTER — Other Ambulatory Visit: Payer: Self-pay | Admitting: Family Medicine

## 2015-06-28 ENCOUNTER — Ambulatory Visit: Payer: BC Managed Care – PPO | Admitting: Physical Therapy

## 2015-07-12 ENCOUNTER — Ambulatory Visit (INDEPENDENT_AMBULATORY_CARE_PROVIDER_SITE_OTHER): Payer: BC Managed Care – PPO | Admitting: Family Medicine

## 2015-07-12 ENCOUNTER — Encounter: Payer: Self-pay | Admitting: Family Medicine

## 2015-07-12 VITALS — BP 135/80 | HR 74 | Wt 155.0 lb

## 2015-07-12 DIAGNOSIS — M542 Cervicalgia: Secondary | ICD-10-CM

## 2015-07-12 NOTE — Patient Instructions (Signed)
Thank you for coming in today. Use PT as needed.  Continue the medicines.  Attend CBT.  Return as needed.

## 2015-07-12 NOTE — Progress Notes (Signed)
       Carol Diaz is a 50 y.o. female who [facility] today for follow-up chronic neck pain. Patient notes slight improvement in symptoms from the last visit. She feels as a physical therapy has been quite helpful. She notes the increased dose of Cymbalta has helped but causes grogginess. She feels reasonably well with her symptoms. She denies any weakness or numbness or loss of function. She is interested in pursuing cognitive behavioral therapy as part of management from suffering from chronic pain.   Past Medical History  Diagnosis Date  . Chronic fatigue syndrome   . Migraines   . Palpitations    Past Surgical History  Procedure Laterality Date  . Tubal ligation    . Ivp    . Cesarean section     Social History  Substance Use Topics  . Smoking status: Never Smoker   . Smokeless tobacco: Never Used  . Alcohol Use: No   family history includes Emphysema in her mother; Heart disease (age of onset: 1450) in her brother; Hypertension in her father.  ROS as above Medications: Current Outpatient Prescriptions  Medication Sig Dispense Refill  . cyclobenzaprine (FLEXERIL) 10 MG tablet take 1/2 to 1 tablet by mouth three times a day if needed for muscle spasm 45 tablet 2  . DULoxetine (CYMBALTA) 60 MG capsule Take 1 capsule (60 mg total) by mouth 2 (two) times daily. After 2 weeks can increase to twice a day. 60 capsule 3  . fluticasone (FLONASE) 50 MCG/ACT nasal spray One spray in each nostril twice a day, use left hand for right nostril, and right hand for left nostril. 48 g 3  . gabapentin (NEURONTIN) 100 MG capsule take 1 capsule by mouth every morning then 1 every evening and 2 at bedtime IF TOLERATED 120 capsule 1  . levonorgestrel-ethinyl estradiol (SEASONALE,INTROVALE,JOLESSA) 0.15-0.03 MG tablet Take 1 tablet by mouth daily. 1 Package 4  . LINZESS 290 MCG CAPS capsule  take 1 capsule by mouth once daily 30 MINUTES BEFORE FIRST MEAL OF DAY 90 capsule 1  . metFORMIN (GLUCOPHAGE) 500 MG tablet Take 1 tablet (500 mg total) by mouth 2 (two) times daily with a meal. 60 tablet 3  . nabumetone (RELAFEN) 500 MG tablet take 1 tablet by mouth twice a day if needed 60 tablet 0  . propranolol ER (INDERAL LA) 60 MG 24 hr capsule take 1 capsule by mouth daily 30 capsule 11  . SUMAtriptan (IMITREX) 100 MG tablet Take 1 tablet (100 mg total) by mouth every 2 (two) hours as needed for migraine. 10 tablet 11   No current facility-administered medications for this visit.   Allergies  Allergen Reactions  . Fluoxetine Other (See Comments)    Made her tremor worse   . Lexapro [Escitalopram Oxalate] Other (See Comments)    Headache  . Morphine Nausea And Vomiting  . Sertraline Other (See Comments)    Headache      Exam:  BP 135/80 mmHg  Pulse 74  Wt 155 lb (70.308 kg) Gen: Well NAD Neck: Decreased motion. Upper extremity strength is intact and normal throughout.  No results found for this or any previous visit (from the past 24 hour(s)). No results found.   Please see individual assessment and plan sections.

## 2015-07-12 NOTE — Assessment & Plan Note (Signed)
Chronic neck pain at this point. Continue to review the decreased likelihood of complete resolution of pain from chronic axial back pain. Discussed goals are increased functioning and decreased suffering. She is a reasonable medical management. Plan to refer for cognitive behavioral therapy as this is evidence based approach to chronic pain management. Return as needed.

## 2015-08-03 ENCOUNTER — Other Ambulatory Visit: Payer: Self-pay | Admitting: Family Medicine

## 2015-08-11 ENCOUNTER — Encounter: Payer: Self-pay | Admitting: Family Medicine

## 2015-08-11 ENCOUNTER — Ambulatory Visit (INDEPENDENT_AMBULATORY_CARE_PROVIDER_SITE_OTHER): Payer: BC Managed Care – PPO | Admitting: Family Medicine

## 2015-08-11 VITALS — BP 124/66 | HR 82 | Wt 155.0 lb

## 2015-08-11 DIAGNOSIS — J0100 Acute maxillary sinusitis, unspecified: Secondary | ICD-10-CM

## 2015-08-11 MED ORDER — AMOXICILLIN-POT CLAVULANATE 875-125 MG PO TABS: 1.0000 | ORAL_TABLET | Freq: Two times a day (BID) | ORAL | Status: DC

## 2015-08-11 MED ORDER — TOPIRAMATE 25 MG PO TABS: 25.0000 mg | ORAL_TABLET | Freq: Every day | ORAL | Status: DC

## 2015-08-11 NOTE — Progress Notes (Signed)
Subjective:    Patient ID: Carol Diaz, female    DOB: [birth year], 50 y.o.   MRN: [id number]  HPI Abnormal weight gain - she was previously on phentermine.  She had plateued as well.  Belviq was not covered on her insurance plan so we had decided to try metformin.  She has been on it for about 8 weeks.  Goal weight of 132.    Cold x 2 weeks. Mild ST and now has more nasal congestion. No fever, chills or sweats.  No ear pain.  She says she is now getting up some yellow sputum and mucus from her nose. She's had a mild headache and some pain over both maxillary sinuses radiating into her upper teeth. She did have a cough initially but that has gotten a little better.   Review of Systems  BP 124/66 mmHg  Pulse 82  Wt 155 lb (70.308 kg)  SpO2 100%    Allergies  Allergen Reactions  . Fluoxetine Other (See Comments)    Made her tremor worse   . Lexapro [Escitalopram Oxalate] Other (See Comments)    Headache  . Morphine Nausea And Vomiting  . Sertraline Other (See Comments)    Headache     Past Medical History  Diagnosis Date  . Chronic fatigue syndrome   . Migraines   . Palpitations     Past Surgical History  Procedure Laterality Date  . Tubal ligation    . Ivp    . Cesarean section      Social History   Social History  . Marital Status: Married    Spouse Name: N/A  . Number of Children: 2  . Years of Education: N/A   Occupational History  .     Social History Main Topics  . Smoking status: Never Smoker   . Smokeless tobacco: Never Used  . Alcohol Use: No  . Drug Use: No  . Sexual Activity:    Partners: Male    Birth Control/ Protection: Pill   Other Topics Concern  . Not on file   Social History Narrative   Some exercise.     Family History  Problem Relation Age of Onset  . Emphysema Mother   . Hypertension Father   . Heart disease Brother 63    mi    Outpatient Encounter Prescriptions as of 08/11/2015  Medication Sig  . cyclobenzaprine  (FLEXERIL) 10 MG tablet take 1/2 to 1 tablet by mouth three times a day if needed for muscle spasm  . DULoxetine (CYMBALTA) 60 MG capsule Take 1 capsule (60 mg total) by mouth 2 (two) times daily. After 2 weeks can increase to twice a day.  . fluticasone (FLONASE) 50 MCG/ACT nasal spray One spray in each nostril twice a day, use left hand for right nostril, and right hand for left nostril.  Marland Kitchen gabapentin (NEURONTIN) 100 MG capsule take 1 capsule by mouth every morning then 1 every evening and 2 at bedtime IF TOLERATED  . levonorgestrel-ethinyl estradiol (SEASONALE,INTROVALE,JOLESSA) 0.15-0.03 MG tablet Take 1 tablet by mouth daily.  Marland Kitchen LINZESS 290 MCG CAPS capsule take 1 capsule by mouth once daily 30 MINUTES BEFORE FIRST MEAL OF DAY  . metFORMIN (GLUCOPHAGE) 500 MG tablet Take 1 tablet (500 mg total) by mouth 2 (two) times daily with a meal.  . nabumetone (RELAFEN) 500 MG tablet take 1 apply by mouth twice a day if needed  . propranolol ER (INDERAL LA) 60 MG 24 hr capsule take 1  capsule by mouth daily  . SUMAtriptan (IMITREX) 100 MG tablet Take 1 tablet (100 mg total) by mouth every 2 (two) hours as needed for migraine.  Marland Kitchen. amoxicillin-clavulanate (AUGMENTIN) 875-125 MG tablet Take 1 tablet by mouth 2 (two) times daily.  Marland Kitchen. topiramate (TOPAMAX) 25 MG tablet Take 1 tablet (25 mg total) by mouth daily.   No facility-administered encounter medications on file as of 08/11/2015.          Objective:   Physical Exam  Constitutional: She is oriented to person, place, and time. She appears well-developed and well-nourished.  HENT:  Head: Normocephalic and atraumatic.  Right Ear: External ear normal.  Left Ear: External ear normal.  Nose: Nose normal.  Mouth/Throat: Oropharynx is clear and moist.  TMs and canals are clear. OP with mild inflammation   Eyes: Conjunctivae and EOM are normal. Pupils are equal, round, and reactive to light.  Neck: Neck supple. No thyromegaly present.  Cardiovascular:  Normal rate, regular rhythm and normal heart sounds.   Pulmonary/Chest: Effort normal and breath sounds normal. She has no wheezes.  Lymphadenopathy:    She has no cervical adenopathy.  Neurological: She is alert and oriented to person, place, and time.  Skin: Skin is warm and dry.  Psychiatric: She has a normal mood and affect. Her behavior is normal.          Assessment & Plan:  Abnormal weight gain-we discussed couple of different options. Her weight has been stable on the metformin which is very reassuring. She's had some mild abdominal cramping but says it's not enough to stop the medication. We also discussed possibly trying a low-dose of Topamax. Will start with 25 mg once a day in the morning. For now she will keep the metformin on board as well. Explained to the Topamax may also help her migraine headaches. And we can adjust her dose will see her back. Follow-up in 2 months. Continue with regular exercise and healthy diet. She is using my fitness pal to help track calories. Next  Acute sinusitis-treat with Augmentin. Call if not better by the end of the week.

## 2015-08-11 NOTE — Patient Instructions (Signed)

## 2015-09-11 ENCOUNTER — Other Ambulatory Visit: Payer: Self-pay | Admitting: Family Medicine

## 2015-09-24 ENCOUNTER — Ambulatory Visit (INDEPENDENT_AMBULATORY_CARE_PROVIDER_SITE_OTHER): Payer: BC Managed Care – PPO | Admitting: Family Medicine

## 2015-09-24 ENCOUNTER — Encounter: Payer: Self-pay | Admitting: Family Medicine

## 2015-09-24 VITALS — BP 136/73 | HR 70 | Wt 157.0 lb

## 2015-09-24 DIAGNOSIS — K2100 Gastro-esophageal reflux disease with esophagitis, without bleeding: Secondary | ICD-10-CM

## 2015-09-24 DIAGNOSIS — R059 Cough, unspecified: Secondary | ICD-10-CM

## 2015-09-24 DIAGNOSIS — J029 Acute pharyngitis, unspecified: Secondary | ICD-10-CM | POA: Diagnosis not present

## 2015-09-24 DIAGNOSIS — R05 Cough: Secondary | ICD-10-CM

## 2015-09-24 DIAGNOSIS — R635 Abnormal weight gain: Secondary | ICD-10-CM

## 2015-09-24 DIAGNOSIS — R319 Hematuria, unspecified: Secondary | ICD-10-CM

## 2015-09-24 LAB — COMPLETE METABOLIC PANEL WITH GFR
ALBUMIN: 3.9 g/dL (ref 3.6–5.1)
ALT: 18 U/L (ref 6–29)
AST: 25 U/L (ref 10–35)
Alkaline Phosphatase: 51 U/L (ref 33–115)
BILIRUBIN TOTAL: 0.3 mg/dL (ref 0.2–1.2)
BUN: 21 mg/dL (ref 7–25)
CO2: 20 mmol/L (ref 20–31)
Calcium: 9 mg/dL (ref 8.6–10.2)
Chloride: 106 mmol/L (ref 98–110)
Creat: 1.1 mg/dL (ref 0.50–1.10)
GFR, EST NON AFRICAN AMERICAN: 59 mL/min — AB (ref >=60)
GFR, Est African American: 68 mL/min (ref >=60)
GLUCOSE: 110 mg/dL — AB (ref 65–99)
POTASSIUM: 4 mmol/L (ref 3.5–5.3)
SODIUM: 136 mmol/L (ref 135–146)
TOTAL PROTEIN: 6.7 g/dL (ref 6.1–8.1)

## 2015-09-24 LAB — CBC WITH DIFFERENTIAL/PLATELET
Basophils Absolute: 0 cells/uL (ref 0–200)
Basophils Relative: 0 %
EOS PCT: 3 %
Eosinophils Absolute: 255 cells/uL (ref 15–500)
HCT: 40.1 % (ref 35.0–45.0)
Hemoglobin: 13.2 g/dL (ref 11.7–15.5)
LYMPHS PCT: 24 %
Lymphs Abs: 2040 cells/uL (ref 850–3900)
MCH: 30.1 pg (ref 27.0–33.0)
MCHC: 32.9 g/dL (ref 32.0–36.0)
MCV: 91.3 fL (ref 80.0–100.0)
MPV: 9.9 fL (ref 7.5–12.5)
Monocytes Absolute: 595 cells/uL (ref 200–950)
Monocytes Relative: 7 %
NEUTROS PCT: 66 %
Neutro Abs: 5610 cells/uL (ref 1500–7800)
PLATELETS: 355 10*3/uL (ref 140–400)
RBC: 4.39 MIL/uL (ref 3.80–5.10)
RDW: 14.2 % (ref 11.0–15.0)
WBC: 8.5 10*3/uL (ref 3.8–10.8)

## 2015-09-24 MED ORDER — PHENTERMINE HCL 37.5 MG PO CAPS: 37.5000 mg | ORAL_CAPSULE | ORAL | Status: DC

## 2015-09-24 MED ORDER — OMEPRAZOLE 40 MG PO CPDR: 40.0000 mg | DELAYED_RELEASE_CAPSULE | Freq: Every day | ORAL | Status: DC

## 2015-09-24 NOTE — Progress Notes (Signed)
Subjective:    CC: Fatigue   HPI: Fatigue - Patient comes in today complaining of extreme fatigue. She says been going on for about a month. Her Cymbalta dose was increased to 60 mg about 6 weeks ago. She says even walking up the 12 steps at her house she feels exhausted by the time she gets to the top. She denies any shortness of breath cough wheezing or chest pain with activity. She denies any recent swelling or retention of fluid.  Cough - she does complain of a cough and a scratchy throat. She has developed some more allergies over the last year to than she did previously. She has noticed she's had a lot more heartburn recently and has been taking Tums for relief it. It does help some. She's been getting a lot of heartburn. She feels like her cough is worse at night and in the morning. She says when she wakes up in the morning she feels that there is a lot of phlegm that's caught in her upper airway that she has to clear her out to the point that she coughs and gags sometimes.  Abnormal weight gain - she would like to restart phentermine. She was on it about 6 months. Ago.  She is taking the metformin and does feel like it helps. She has not had any significant side effects with it.  Past medical history, Surgical history, Family history not pertinant except as noted below, Social history, Allergies, and medications have been entered into the medical record, reviewed, and corrections made.   Review of Systems: No fevers, chills, night sweats, weight loss, chest pain, or shortness of breath.   Objective:    General: Well Developed, well nourished, and in no acute distress.  Neuro: Alert and oriented x3, extra-ocular muscles intact, sensation grossly intact.  HEENT: Normocephalic, atraumatic, Oropharynx is clear. TMs and canals are clear bilaterally. She does have a small slightly enlarged right anterior cervical lymph node. No thyromegaly. Skin: Warm and dry, no rashes. Cardiac: Regular rate  and rhythm, no murmurs rubs or gallops, no lower extremity edema.  Respiratory: Clear to auscultation bilaterally. Not using accessory muscles, speaking in full sentences. Ext: No lower extremity edema. Normal gait.   Impression and Recommendations:   Fatigue-unclear etiology but certainly could be a side effect of the increased dose of Cymbalta. I have her drop her dose back down to 30 g for the next 2 weeks. She thinks she still has her old prescription at home. If not call us back and we can send in a 30 day supply of the 30 mg tabs. We'll go ahead and get a CMP and a CBC to evaluate for anemia. We'll also check urinalysis.  Cough-suspect actually related to reflux and GERD. Recommend a trial of a PPI given some additional information on things to avoid with reflux. She can still use Tums as needed. The cough is not better in 2 weeks and recommend chest x-ray for further evaluation.  GERD- see note above.   Abnormal weight gain-we'll restart phentermine. As she is well aware she will need monthly blood pressure and weight checks. She has not had any recent chest pain etc. Blood pressure is at goal today and pulse is normal.

## 2015-09-24 NOTE — Patient Instructions (Addendum)
Call if the cough is not better in 2 weeks and will have you go for chest xray.  Start a probiotic for your gut as well to help with reflux and promote weight loss.  Decrease Cymbalta down to 30mg .  Call if out of old medication.       Food Choices for Gastroesophageal Reflux Disease, Adult When you have gastroesophageal reflux disease (GERD), the foods you eat and your eating habits are very important. Choosing the right foods can help ease the discomfort of GERD. WHAT GENERAL GUIDELINES DO I NEED TO FOLLOW?  Choose fruits, vegetables, whole grains, low-fat dairy products, and low-fat meat, fish, and poultry.  Limit fats such as oils, salad dressings, butter, nuts, and avocado.  Keep a food diary to identify foods that cause symptoms.  Avoid foods that cause reflux. These may be different for different people.  Eat frequent small meals instead of three large meals each day.  Eat your meals slowly, in a relaxed setting.  Limit fried foods.  Cook foods using methods other than frying.  Avoid drinking alcohol.  Avoid drinking large amounts of liquids with your meals.  Avoid bending over or lying down until 2-3 hours after eating. WHAT FOODS ARE NOT RECOMMENDED? The following are some foods and drinks that may worsen your symptoms: Vegetables Tomatoes. Tomato juice. Tomato and spaghetti sauce. Chili peppers. Onion and garlic. Horseradish. Fruits Oranges, grapefruit, and lemon (fruit and juice). Meats High-fat meats, fish, and poultry. This includes hot dogs, ribs, ham, sausage, salami, and bacon. Dairy Whole milk and chocolate milk. Sour cream. Cream. Butter. Ice cream. Cream cheese.  Beverages Coffee and tea, with or without caffeine. Carbonated beverages or energy drinks. Condiments Hot sauce. Barbecue sauce.  Sweets/Desserts Chocolate and cocoa. Donuts. Peppermint and spearmint. Fats and Oils High-fat foods, including [place] fries and potato chips. Other Vinegar.  Strong spices, such as black pepper, white pepper, red pepper, cayenne, curry powder, cloves, ginger, and chili powder. The items listed above may not be a complete list of foods and beverages to avoid. Contact your dietitian for more information.   This information is not intended to replace advice given to you by your health care provider. Make sure you discuss any questions you have with your health care provider.   Document Released: 03/13/2005 Document Revised: 04/03/2014 Document Reviewed: 01/15/2013 Elsevier Interactive Patient Education [employer] Inc.

## 2015-09-25 LAB — URINALYSIS
BILIRUBIN URINE: NEGATIVE
GLUCOSE, UA: NEGATIVE
Ketones, ur: NEGATIVE
LEUKOCYTES UA: NEGATIVE
Nitrite: NEGATIVE
PH: 5.5 (ref 5.0–8.0)
Protein, ur: NEGATIVE
Specific Gravity, Urine: 1.026 (ref 1.001–1.035)

## 2015-09-27 ENCOUNTER — Encounter: Payer: Self-pay | Admitting: Family Medicine

## 2015-09-27 DIAGNOSIS — N182 Chronic kidney disease, stage 2 (mild): Secondary | ICD-10-CM | POA: Insufficient documentation

## 2015-09-27 NOTE — Addendum Note (Signed)
Addended by: Deno EtienneBARKLEY, Ghali Morissette L on: 09/27/2015 05:27 PM   Modules accepted: Orders

## 2015-10-03 ENCOUNTER — Other Ambulatory Visit: Payer: Self-pay | Admitting: Family Medicine

## 2015-10-06 ENCOUNTER — Ambulatory Visit (INDEPENDENT_AMBULATORY_CARE_PROVIDER_SITE_OTHER): Payer: BC Managed Care – PPO

## 2015-10-06 DIAGNOSIS — J029 Acute pharyngitis, unspecified: Secondary | ICD-10-CM

## 2015-10-06 DIAGNOSIS — R05 Cough: Secondary | ICD-10-CM | POA: Diagnosis not present

## 2015-10-06 DIAGNOSIS — R3129 Other microscopic hematuria: Secondary | ICD-10-CM | POA: Diagnosis not present

## 2015-10-06 DIAGNOSIS — R059 Cough, unspecified: Secondary | ICD-10-CM

## 2015-10-06 DIAGNOSIS — R319 Hematuria, unspecified: Secondary | ICD-10-CM

## 2015-10-07 LAB — URINALYSIS, MICROSCOPIC ONLY
BACTERIA UA: NONE SEEN [HPF]
CRYSTALS: NONE SEEN [HPF]
Casts: NONE SEEN [LPF]
Yeast: NONE SEEN [HPF]

## 2015-10-07 LAB — URINALYSIS, ROUTINE W REFLEX MICROSCOPIC
BILIRUBIN URINE: NEGATIVE
GLUCOSE, UA: NEGATIVE
Ketones, ur: NEGATIVE
LEUKOCYTES UA: NEGATIVE
NITRITE: NEGATIVE
PH: 7 (ref 5.0–8.0)
Protein, ur: NEGATIVE
Specific Gravity, Urine: 1.007 (ref 1.001–1.035)

## 2015-10-11 ENCOUNTER — Ambulatory Visit: Payer: BC Managed Care – PPO | Admitting: Family Medicine

## 2015-10-22 ENCOUNTER — Encounter: Payer: Self-pay | Admitting: Family Medicine

## 2015-10-22 ENCOUNTER — Ambulatory Visit (INDEPENDENT_AMBULATORY_CARE_PROVIDER_SITE_OTHER): Payer: BC Managed Care – PPO | Admitting: Family Medicine

## 2015-10-22 VITALS — BP 121/80 | HR 79 | Wt 153.0 lb

## 2015-10-22 DIAGNOSIS — G4700 Insomnia, unspecified: Secondary | ICD-10-CM

## 2015-10-22 DIAGNOSIS — M797 Fibromyalgia: Secondary | ICD-10-CM | POA: Diagnosis not present

## 2015-10-22 DIAGNOSIS — M542 Cervicalgia: Secondary | ICD-10-CM | POA: Diagnosis not present

## 2015-10-22 DIAGNOSIS — R5383 Other fatigue: Secondary | ICD-10-CM | POA: Diagnosis not present

## 2015-10-22 MED ORDER — PHENTERMINE HCL 37.5 MG PO CAPS: 37.5000 mg | ORAL_CAPSULE | ORAL | 0 refills | Status: DC

## 2015-10-22 MED ORDER — DULOXETINE HCL 30 MG PO CPEP: 30.0000 mg | ORAL_CAPSULE | Freq: Every day | ORAL | 1 refills | Status: DC

## 2015-10-22 NOTE — Progress Notes (Signed)
Subjective:    CC:   HPI:  F/U fatigue - We decrease her Cymbalta down to 30 mg month ago. She does feel like she has more energy but has noticed in tenderness and pain over her legs arms from her Fibro but she is happier not being sedated.    Abnormal weight gain - now on phentermine. Doing well with no S.E.   Muscle cramps- takes her flexeril every night.  Hasn't been sleeping as well lately but she goes back to work on Monday from her summer break so thinks part of it is anxiety.   Past medical history, Surgical history, Family history not pertinant except as noted below, Social history, Allergies, and medications have been entered into the medical record, reviewed, and corrections made.   Review of Systems: No fevers, chills, night sweats, weight loss, chest pain, or shortness of breath.   Objective:    General: Well Developed, well nourished, and in no acute distress.  Neuro: Alert and oriented x3, extra-ocular muscles intact, sensation grossly intact.  HEENT: Normocephalic, atraumatic  Skin: Warm and dry, no rashes. Cardiac: Regular rate and rhythm, no murmurs rubs or gallops, no lower extremity edema.  Respiratory: Clear to auscultation bilaterally. Not using accessory muscles, speaking in full sentences.   Impression and Recommendations:   Fatigue - improved with the decrease dose of Cymbalta. We'll continue with 30 mg. 90 day supply sent to the local pharmacy.  Muscle cramps = continue Flexeril nightly. She also uses it for neck pain.    fibromyalgia-she has had a slight increase in pain with the decrease in Cymbalta but right now she is happy with her regimen so we will continue with this. Next  Insomnia-think her more recent insomnia is been triggered by anxiety. But certainly if it becomes persistent and we could consider a trial of doxepin especially now that she is on a lower dose of Cymbalta.  Abnormal weight gain-she's done fantastic and has lost 4 pounds. We'll  continue his medication. Continue to work on increasing activity level and working on [employer].

## 2015-11-18 ENCOUNTER — Ambulatory Visit (INDEPENDENT_AMBULATORY_CARE_PROVIDER_SITE_OTHER): Payer: BC Managed Care – PPO | Admitting: Family Medicine

## 2015-11-18 VITALS — BP 118/72 | HR 87 | Wt 150.0 lb

## 2015-11-18 DIAGNOSIS — R635 Abnormal weight gain: Secondary | ICD-10-CM

## 2015-11-18 MED ORDER — PHENTERMINE HCL 37.5 MG PO CAPS: 37.5000 mg | ORAL_CAPSULE | ORAL | 0 refills | Status: DC

## 2015-11-18 NOTE — Progress Notes (Signed)
   Subjective:    Patient ID: Carol Diaz, female    DOB: [birth year], 50 y.o.   MRN: [id number]  HPI  Carol AltKathy M Diaz is here for blood pressure and weight check. Diet and exercise is going well. Denies trouble sleeping or palpitations.   Review of Systems     Objective:   Physical Exam        Assessment & Plan:  Patient has lost weight. A refill on the phentermine will be faxed to the pharmacy later today. Patient advised to schedule a follow up for 30 day from now.

## 2015-11-18 NOTE — Progress Notes (Signed)
Agree with above.  Catherine Metheney, MD  

## 2015-11-19 ENCOUNTER — Ambulatory Visit: Payer: BC Managed Care – PPO

## 2015-12-02 ENCOUNTER — Other Ambulatory Visit: Payer: Self-pay | Admitting: Family Medicine

## 2015-12-15 ENCOUNTER — Ambulatory Visit: Payer: BC Managed Care – PPO

## 2015-12-16 ENCOUNTER — Ambulatory Visit: Payer: BC Managed Care – PPO

## 2015-12-21 ENCOUNTER — Ambulatory Visit: Payer: BC Managed Care – PPO

## 2015-12-24 ENCOUNTER — Ambulatory Visit (INDEPENDENT_AMBULATORY_CARE_PROVIDER_SITE_OTHER): Payer: BC Managed Care – PPO | Admitting: Family Medicine

## 2015-12-24 VITALS — BP 126/80 | HR 75 | Temp 98.1°F | Wt 152.0 lb

## 2015-12-24 DIAGNOSIS — R635 Abnormal weight gain: Secondary | ICD-10-CM

## 2015-12-24 DIAGNOSIS — Z23 Encounter for immunization: Secondary | ICD-10-CM

## 2015-12-24 MED ORDER — PHENTERMINE HCL 37.5 MG PO CAPS: 37.5000 mg | ORAL_CAPSULE | ORAL | 0 refills | Status: DC

## 2015-12-24 NOTE — Progress Notes (Signed)
  Agree with above. Needs minimize of 2 lbs to continue with phentermine.   Nani Gasseratherine Metheney, MD

## 2015-12-24 NOTE — Progress Notes (Signed)
Patient is here for blood pressure and weight check. Denies any trouble sleeping, palpitations, or any other medication problems. Patient has not lost weight, states she had a rough past couple weeks with her fibromyalgia. A refill for Phentermine will be sent to Provider for review. Patient advised to strive for a minimum 4 lbs weight loss per month.   Patient also wanted to get her flu vaccination today. Went over flu shot questionnaire prior to administration of immunization. Patient tolerated injection of flu immunization in Right deltoid well, with no immediate complications.   Advised to schedule a four week nurse visit and keep her upcoming appointment with her PCP. Verbalized understanding, no further questions.

## 2016-01-07 ENCOUNTER — Ambulatory Visit: Payer: BC Managed Care – PPO | Admitting: Family Medicine

## 2016-01-22 ENCOUNTER — Other Ambulatory Visit: Payer: Self-pay | Admitting: Family Medicine

## 2016-01-24 ENCOUNTER — Encounter: Payer: Self-pay | Admitting: Family Medicine

## 2016-01-24 ENCOUNTER — Ambulatory Visit (INDEPENDENT_AMBULATORY_CARE_PROVIDER_SITE_OTHER): Payer: BC Managed Care – PPO | Admitting: Family Medicine

## 2016-01-24 VITALS — BP 127/71 | HR 75 | Ht 66.0 in | Wt 150.0 lb

## 2016-01-24 DIAGNOSIS — I10 Essential (primary) hypertension: Secondary | ICD-10-CM | POA: Diagnosis not present

## 2016-01-24 DIAGNOSIS — R635 Abnormal weight gain: Secondary | ICD-10-CM

## 2016-01-24 DIAGNOSIS — M797 Fibromyalgia: Secondary | ICD-10-CM | POA: Diagnosis not present

## 2016-01-24 DIAGNOSIS — K5909 Other constipation: Secondary | ICD-10-CM

## 2016-01-24 MED ORDER — TOPIRAMATE 25 MG PO TABS: 25.0000 mg | ORAL_TABLET | Freq: Every day | ORAL | 1 refills | Status: DC

## 2016-01-24 MED ORDER — PHENTERMINE HCL 37.5 MG PO CAPS: 37.5000 mg | ORAL_CAPSULE | ORAL | 0 refills | Status: DC

## 2016-01-24 MED ORDER — METFORMIN HCL 500 MG PO TABS: 500.0000 mg | ORAL_TABLET | Freq: Two times a day (BID) | ORAL | 3 refills | Status: DC

## 2016-01-24 NOTE — Progress Notes (Signed)
Subjective:    CC: weight check  HPI:  Abnormal weight gain-she is currently on phentermine for appetite suppression and weight control. She's been doing well with that and total bottom month ago where she actually had gained 2 pounds. Head, gotten off track. Encouraged her to get back on track and we continued the medication for 1 more month with a follow-up today to make sure that she had at least loss of 2 pounds that she gained with a goal ultimately of 4 pounds. She has not been able to exercise much because her fibromyalgia has been flaring as well. Next  Fibromyalgia- she has been having a flare. She didn't having a lot of cervical pain radiating particularly down to her left shoulder. In fact she just wants to make sure that her shoulders okay. She still able to elevate it past 90 and fix her hair. She denies any recent injuries or trauma. She's tried physical therapy a couple times in the past and unfortunately actually flared her pain.  She describes the muscle pain in her neck and towards her left shoulder as a burning sensation. She has been taking more Aleve and Tylenol recently for pain control. She still uses her muscle relaxer nightly.  Constipation-she does have chronic constipation and she is currently on NSAIDs. She's been having these episodes of nausea that occurs several hours before a bowel movement. She never actually vomits but he gets very uncomfortable.  Past medical history, Surgical history, Family history not pertinant except as noted below, Social history, Allergies, and medications have been entered into the medical record, reviewed, and corrections made.   Review of Systems: No fevers, chills, night sweats, weight loss, chest pain, or shortness of breath.   Objective:    General: Well Developed, well nourished, and in no acute distress.  Neuro: Alert and oriented x3, extra-ocular muscles intact, sensation grossly intact.  HEENT: Normocephalic, atraumatic  Skin:  Warm and dry, no rashes. Cardiac: Regular rate and rhythm, no murmurs rubs or gallops, no lower extremity edema.  Respiratory: Clear to auscultation bilaterally. Not using accessory muscles, speaking in full sentences. MSK: Neck with normal flexion and extension. Shoulders with normal range of motion bilaterally. Strength at the shoulder elbow and wrist is 5 out of 5 bilaterally. Negative for impingement.   Impression and Recommendations:   Abnormal weight gain-we'll continue phentermine. Continue to work on healthy diet exercise and weight loss. She needs to lose at least 2 pounds over the next month as well.  Fibromyalgia-she has been flaring. We discussed continuing to work on stretches heat and ice. We also discussed other alternatives such as massage and or acupuncture as an option. She could certainly check and see if her insurance will help cover massage therapy. If they do then we can just write a letter of medical necessity. I do think would be helpful for her since she has not been able to tolerate physical therapy.   Chronic constipation-she's having severe bouts of nausea before bowel movement she most likely is still constipated even though she is moving her bowels some. Recommend that she do a clean out by taking MiraLAX twice a day until she has a soft mushy bowel movement. Then can restart the Linzess.

## 2016-01-25 ENCOUNTER — Ambulatory Visit: Payer: BC Managed Care – PPO | Admitting: Family Medicine

## 2016-01-25 ENCOUNTER — Other Ambulatory Visit: Payer: Self-pay | Admitting: Family Medicine

## 2016-01-25 DIAGNOSIS — R635 Abnormal weight gain: Secondary | ICD-10-CM

## 2016-01-30 ENCOUNTER — Other Ambulatory Visit: Payer: Self-pay | Admitting: Family Medicine

## 2016-03-21 ENCOUNTER — Other Ambulatory Visit: Payer: Self-pay | Admitting: Family Medicine

## 2016-03-31 ENCOUNTER — Other Ambulatory Visit: Payer: Self-pay | Admitting: Family Medicine

## 2016-04-03 ENCOUNTER — Other Ambulatory Visit: Payer: Self-pay | Admitting: Family Medicine

## 2016-04-03 ENCOUNTER — Other Ambulatory Visit: Payer: Self-pay | Admitting: *Deleted

## 2016-04-03 MED ORDER — TOPIRAMATE 25 MG PO TABS: 25.0000 mg | ORAL_TABLET | Freq: Every day | ORAL | 1 refills | Status: DC

## 2016-04-17 ENCOUNTER — Encounter: Payer: Self-pay | Admitting: Family Medicine

## 2016-04-17 ENCOUNTER — Other Ambulatory Visit: Payer: Self-pay | Admitting: Family Medicine

## 2016-04-17 ENCOUNTER — Ambulatory Visit (INDEPENDENT_AMBULATORY_CARE_PROVIDER_SITE_OTHER): Payer: BC Managed Care – PPO | Admitting: Family Medicine

## 2016-04-17 VITALS — BP 130/78 | HR 82 | Ht 66.0 in | Wt 160.0 lb

## 2016-04-17 DIAGNOSIS — R202 Paresthesia of skin: Secondary | ICD-10-CM

## 2016-04-17 DIAGNOSIS — R635 Abnormal weight gain: Secondary | ICD-10-CM | POA: Diagnosis not present

## 2016-04-17 DIAGNOSIS — M7711 Lateral epicondylitis, right elbow: Secondary | ICD-10-CM

## 2016-04-17 DIAGNOSIS — M797 Fibromyalgia: Secondary | ICD-10-CM

## 2016-04-17 DIAGNOSIS — I10 Essential (primary) hypertension: Secondary | ICD-10-CM | POA: Diagnosis not present

## 2016-04-17 MED ORDER — LIRAGLUTIDE -WEIGHT MANAGEMENT 18 MG/3ML ~~LOC~~ SOPN: 0.6000 mg | PEN_INJECTOR | Freq: Every day | SUBCUTANEOUS | 1 refills | Status: DC

## 2016-04-17 NOTE — Progress Notes (Signed)
Subjective:    CC: HTN  HPI: Hypertension- Pt denies chest pain, SOB, dizziness, or heart palpitations.  Taking meds as directed w/o problems.  Denies medication side effects.    Fibromyalgia - she feels like she is have some difficulty with her myalgia.  She is having dec energy.  More aches and pains.  Feet have been tingling when she gets out of bed.    She has gained 10 lbs since October.  She is frustrated. She hasn't been able to exercise as much but feels like she has been trying to watch which she is eating. She's been off the phentermine since October.  Right lateral elbow pain x 1 month.  This the lateral epicondylitis is back.  She says it is painful with reaching out and very render if she bumps it.  Had injection about 9 months ago and would like to have another.   Past medical history, Surgical history, Family history not pertinant except as noted below, Social history, Allergies, and medications have been entered into the medical record, reviewed, and corrections made.   Review of Systems: No fevers, chills, night sweats, weight loss, chest pain, or shortness of breath.   Objective:    General: Well Developed, well nourished, and in no acute distress.  Neuro: Alert and oriented x3, extra-ocular muscles intact, sensation grossly intact.  HEENT: Normocephalic, atraumatic  Skin: Warm and dry, no rashes. Cardiac: Regular rate and rhythm, no murmurs rubs or gallops, no lower extremity edema.  Respiratory: Clear to auscultation bilaterally. Not using accessory muscles, speaking in full sentences.   Impression and Recommendations:   HTN - Well controlled. Continue current regimen. Follow up in  6 months.   Right lateral epicondylitis - Discussed diagnosis. She's done well with the cortisone injection in the past and agreed to have that done again today. Recommend ice compression and avoiding repetitive motions and heavy lifting with that right elbow. Given for today.  Abnormal  weight gain-she has gained a lot of weight a very short period of time. I'm concerned that she may actually be rebounding from the phentermine. She's been on and off of it for so much over the last several years that I feel like it's actually starting to affect her metabolism when she's not taking it. We did discuss other options. She has tried chondroitin pelvic in the past. Will try Sexenda.  Paresthesias in feet - check for deficienes. Consider nerve conduction study for further evaluation.  Fibromyalgia -  Continue current regimen. Work on increasing activity and working on exercise.    Elbow Injection Procedure Note  Pre-operative Diagnosis: right epicondylitis  Post-operative Diagnosis: same  Indications: symptom relief  Anesthesia: ethyl chloride spray   Procedure Details   Verbal consent was obtained for the procedure. The point of maximum tenderness was identified and marked. The skin prepped with alcohol and the area over the lateral epicondyle was injected with 1 mL of 1% lidocaine without epinephrine and 1 mL of 40 mg triamcinolone. The skin was pinched to try to minimize any atrophy of the skin. Patient tolerated well.   Complications:  None; patient tolerated the procedure well.

## 2016-04-18 ENCOUNTER — Ambulatory Visit: Payer: BC Managed Care – PPO | Admitting: Family Medicine

## 2016-04-18 LAB — VITAMIN B12: Vitamin B-12: 313 pg/mL (ref 200–1100)

## 2016-04-18 LAB — SEDIMENTATION RATE: Sed Rate: 8 mm/hr (ref 0–20)

## 2016-04-18 LAB — VITAMIN D 25 HYDROXY (VIT D DEFICIENCY, FRACTURES): Vit D, 25-Hydroxy: 35 ng/mL (ref 30–100)

## 2016-04-18 LAB — FOLATE: Folate: 9.1 ng/mL (ref >5.4)

## 2016-04-18 LAB — C-REACTIVE PROTEIN: CRP: 5.7 mg/L (ref <8.0)

## 2016-04-18 LAB — FERRITIN: Ferritin: 11 ng/mL (ref 10–232)

## 2016-04-20 ENCOUNTER — Telehealth: Payer: Self-pay | Admitting: *Deleted

## 2016-04-20 NOTE — Telephone Encounter (Signed)
Submitted PA through covermymeds. This will most likely be denied since BMI is under 27

## 2016-04-24 ENCOUNTER — Encounter: Payer: Self-pay | Admitting: Family Medicine

## 2016-04-27 NOTE — Telephone Encounter (Signed)
Carol Diaz was denied since BMI is under 27

## 2016-04-28 NOTE — Telephone Encounter (Signed)
Pleas call and let pt know. See if she would be interested in a nutrition referral?

## 2016-04-28 NOTE — Telephone Encounter (Signed)
Patient notified and does not want to do the referral

## 2016-05-10 ENCOUNTER — Ambulatory Visit (INDEPENDENT_AMBULATORY_CARE_PROVIDER_SITE_OTHER): Payer: BC Managed Care – PPO | Admitting: Physician Assistant

## 2016-05-10 VITALS — BP 130/87 | HR 80 | Wt 155.0 lb

## 2016-05-10 DIAGNOSIS — M62830 Muscle spasm of back: Secondary | ICD-10-CM | POA: Diagnosis not present

## 2016-05-10 DIAGNOSIS — G43009 Migraine without aura, not intractable, without status migrainosus: Secondary | ICD-10-CM

## 2016-05-10 MED ORDER — TOPIRAMATE 50 MG PO TABS: 50.0000 mg | ORAL_TABLET | Freq: Every day | ORAL | 2 refills | Status: DC

## 2016-05-10 MED ORDER — METOCLOPRAMIDE HCL 5 MG/ML IJ SOLN: 10.0000 mg | Freq: Once | INTRAVENOUS | Status: DC

## 2016-05-10 MED ORDER — METOCLOPRAMIDE HCL 10 MG PO TABS: 10.0000 mg | ORAL_TABLET | Freq: Two times a day (BID) | ORAL | 1 refills | Status: DC | PRN

## 2016-05-10 MED ORDER — SODIUM CHLORIDE 0.9 % IV SOLN: 4.0000 mg | Freq: Once | INTRAVENOUS | Status: DC

## 2016-05-10 NOTE — Patient Instructions (Addendum)
At the onset of headache: Take 1000mg  Tylenol, 1 tab Sumatriptan, and 1 tab Reglan (metoclopramide) If Headache persists after 2 hours: Repeat 1 tab Reglan and 1 Benadryl  Increase Topamax to 50mg  nightly  Take your Flexeril as needed for neck muscle spasm  Follow-up with Dr. Darra LisMethaney in 2 weeks

## 2016-05-10 NOTE — Progress Notes (Signed)
HPI:                                                                Carol Diaz is a 51 y.o. female who presents to [name] today for headache  Patient with history of migraines presents with intractable headache x 6 days. Headache is in the right occipital region and is associated with some neck pain. Describes headache as a dull pain. Endorses photophobia, phonophobia, and nausea. She has tried Tylenol and Imitrex, which she states takes the edge off, but does not make it go away completely. She is able to sleep and headache does not wake her from sleep. Denies fever, chills, nightsweats. Denies vision loss, floaters, or aura. Denies paresthesias, focal weakness, or LOC. She does endorse increased stress preparing for her daughters wedding on Friday. Patient reports she is having approx. 2 migraines per week  Patient does have a history of HTN and DMII. She does check her pressure at home and states it has been in range. She has been eating and drinking without difficulty and taking her diabetes medications.   Past Medical History:  Diagnosis Date  . Chronic fatigue syndrome   . Migraines   . Palpitations    Past Surgical History:  Procedure Laterality Date  . CESAREAN SECTION    . ivp    . TUBAL LIGATION     Social History  Substance Use Topics  . Smoking status: Never Smoker  . Smokeless tobacco: Never Used  . Alcohol use No   family history includes Emphysema in her mother; Heart disease (age of onset: 49) in her brother; Hypertension in her father.  ROS: negative except as noted in the HPI  Medications: Current Outpatient Prescriptions  Medication Sig Dispense Refill  . cyclobenzaprine (FLEXERIL) 10 MG tablet TAKE 1/2 TO 1 TABLET BY MOUTH THREE TIMES DAILY IF NEEDED FOR MUSCLE SPASM 45 tablet 2  . DULoxetine (CYMBALTA) 30 MG capsule Take 1 capsule (30 mg total) by mouth daily. After 2 weeks can increase to twice a  day. 90 capsule 1  . fluticasone (FLONASE) 50 MCG/ACT nasal spray One spray in each nostril twice a day, use left hand for right nostril, and right hand for left nostril. 48 g 3  . gabapentin (NEURONTIN) 100 MG capsule take 1 capsule by mouth every morning then 1 every evening and 2 at bedtime IF TOLERATED 120 capsule 1  . LINZESS 290 MCG CAPS capsule take 1 capsule by mouth once daily 30 MINUTES BEFORE FIRST MEAL OF DAY 90 capsule 1  . Liraglutide -Weight Management (SAXENDA) 18 MG/3ML SOPN Inject 0.6 mg into the skin daily. After one week increase to 1.2 mg QD.then after 2 weeks increase to 1.8mg  if tolerated. 3 pen 1  . metFORMIN (GLUCOPHAGE) 500 MG tablet Take 1 tablet (500 mg total) by mouth 2 (two) times daily with a meal. 60 tablet 3  . nabumetone (RELAFEN) 500 MG tablet take 1 tablet by mouth twice a day if needed 60 tablet 0  . omeprazole (PRILOSEC) 40 MG capsule Take 1 capsule (40 mg total) by mouth daily. 30 capsule 1  . propranolol ER (INDERAL LA) 60 MG 24 hr capsule take 1 capsule by mouth daily 30  capsule 11  . SETLAKIN 0.15-0.03 MG tablet take 1 tablet by mouth daily 91 tablet 4  . SUMAtriptan (IMITREX) 100 MG tablet Take 1 tablet (100 mg total) by mouth every 2 (two) hours as needed for migraine. 10 tablet 11  . topiramate (TOPAMAX) 25 MG tablet take 1 tablet by mouth once daily 30 tablet 1   No current facility-administered medications for this visit.    Allergies  Allergen Reactions  . Fluoxetine Other (See Comments)    Made her tremor worse   . Lexapro [Escitalopram Oxalate] Other (See Comments)    Headache  . Morphine Nausea And Vomiting  . Sertraline Other (See Comments)    Headache        Objective:  BP 130/87   Pulse 80   Wt 155 lb (70.3 kg)   BMI 25.02 kg/m  Gen: well-groomed, cooperative, not ill-appearing, appears uncomfortable, no distress Neck: neck supple, tender over occipital protuberance, paraspinal cervical spasm, negative brudzinski's  Pulm:  Normal work of breathing, normal phonation, clear to auscultation bilaterally, no wheezes, rales or rhonchi CV: Normal rate, regular rhythm, s1 and s2 distinct, no murmurs, clicks or rubs  Neuro: alert and oriented x 3, cranial nerves II-XII intact. DTR's 2+ and symmetric, normal finger-to-nose, negative pronator drift MSK: strength 5/5 and symmetric in bilateral upper and lower extremities, normal gait and station Skin: warm and dry, no rashes or lesions on exposed skin, no cyanosis Psych: appropriate affect, pleasant mood, normal speech and vocabulary, normal thought content   22g angiocatheter inserted into right antecubital vein. 1L normal saline hung wide open   Assessment and Plan: 51 y.o. female with   Paraspinal muscle spasm - Flexeril 5mg  tid prn  Migraine without aura and without status migrainosus, not intractable - no neurologic symptoms, reassuring neuro exam, stable vital signs - IV cocktail of Decadron 4mg  and Reglan 10mg  given IVPB. 500ml of normal saline administered over 40 minutes. Patient became headache-free - increasing Topamax to 50mg  nightly - adding Reglan 10mg  bid prn to abortive therapy - follow-up with PCP in 2 weeks - topiramate (TOPAMAX) 50 MG tablet; Take 1 tablet (50 mg total) by mouth at bedtime.  Dispense: 30 tablet; Refill: 2 - metoCLOPramide (REGLAN) 10 MG tablet; Take 1 tablet (10 mg total) by mouth 2 (two) times daily as needed (migraine).  Dispense: 30 tablet; Refill: 1  Patient education and anticipatory guidance given Patient agrees with treatment plan Follow-up with PCP in 2 weeks or sooner as needed  Levonne Hubertharley E. Cummings PA-C

## 2016-05-24 ENCOUNTER — Encounter: Payer: Self-pay | Admitting: Family Medicine

## 2016-05-24 ENCOUNTER — Ambulatory Visit (INDEPENDENT_AMBULATORY_CARE_PROVIDER_SITE_OTHER): Payer: BC Managed Care – PPO | Admitting: Family Medicine

## 2016-05-24 VITALS — BP 125/72 | HR 71 | Ht 66.0 in | Wt 152.0 lb

## 2016-05-24 DIAGNOSIS — N182 Chronic kidney disease, stage 2 (mild): Secondary | ICD-10-CM | POA: Diagnosis not present

## 2016-05-24 DIAGNOSIS — Z8349 Family history of other endocrine, nutritional and metabolic diseases: Secondary | ICD-10-CM | POA: Diagnosis not present

## 2016-05-24 DIAGNOSIS — G43009 Migraine without aura, not intractable, without status migrainosus: Secondary | ICD-10-CM | POA: Diagnosis not present

## 2016-05-24 LAB — BASIC METABOLIC PANEL WITH GFR
BUN: 19 mg/dL (ref 7–25)
CALCIUM: 9.5 mg/dL (ref 8.6–10.4)
CO2: 17 mmol/L — ABNORMAL LOW (ref 20–31)
Chloride: 110 mmol/L (ref 98–110)
Creat: 1.15 mg/dL — ABNORMAL HIGH (ref 0.50–1.05)
GFR, EST AFRICAN AMERICAN: 64 mL/min (ref >=60)
GFR, EST NON AFRICAN AMERICAN: 56 mL/min — AB (ref >=60)
GLUCOSE: 76 mg/dL (ref 65–99)
Potassium: 4.3 mmol/L (ref 3.5–5.3)
SODIUM: 140 mmol/L (ref 135–146)

## 2016-05-24 NOTE — Progress Notes (Signed)
Subjective:    CC: Migraine HA  HPI:  Migraine HA - Today's to follow-up for migraine headache. Unfortunately she had an intractable headache that lasted about 6 days and she finally came in on the 20 14th to have it treated. She had not had a headache that bad and quite some time. Her daughter was actually getting married a couple days later and so wanted acute treatment. She was given IV cocktail with Decadron and Reglan and normal saline. She did get some relief fairly quickly but says it took about 2 more days for the headache to completely resolve. Her Topamax was also increased to 50 mg nightly and she says she's been tolerating the increased dose well without any side effects or problems. He still uses her Imitrex as needed for rescue and says normally it works majority of the time but this time it did not.  CKD 2- renal US normal in July 2017.  Last renal function with GRF below 60.  Her GFR, bounces between 50-70 depending on how well hydrated she has.  She says that her sister recently diagnosed with hyperparathyroidism and she feels like over time she's had some similar symptoms and would like to be evaluated for this.  Past medical history, Surgical history, Family history not pertinant except as noted below, Social history, Allergies, and medications have been entered into the medical record, reviewed, and corrections made.   Review of Systems: No fevers, chills, night sweats, weight loss, chest pain, or shortness of breath.   Objective:    General: Well Developed, well nourished, and in no acute distress.  Neuro: Alert and oriented x3, extra-ocular muscles intact, sensation grossly intact.  HEENT: Normocephalic, atraumatic  Skin: Warm and dry, no rashes. Cardiac: Regular rate and rhythm, no murmurs rubs or gallops, no lower extremity edema.  Respiratory: Clear to auscultation bilaterally. Not using accessory muscles, speaking in full sentences.   Impression and  Recommendations:    Migraine HA - continue prophylaxis with Topamax 50 mg nightly. Follow-up in 3 months. Continue with rescue dose of Imitrex as needed. Follow-up any problems concerns or side effects. Continue to monitor frequency of headaches.Does work on lowering stress levels related to exercise and getting adequate sleep.   CKD 3 - Stable. Continue to monitor every 6 months. Okay for occasional anti-inflammatory use but not frequent. For Tylenol for mainstay for pain relief and fever relief. Due to recheck renal function today. Just encourage her to really work on increasing her water intake.  Family history of hyperparathyroidism-discussed that typically this presents with abnormal calcium levels and she's always had normal calcium levels but certainly we can check a PTH level.

## 2016-05-25 LAB — PTH, INTACT AND CALCIUM
Calcium: 9.5 mg/dL (ref 8.6–10.4)
PTH: 19 pg/mL (ref 14–64)

## 2016-05-27 ENCOUNTER — Other Ambulatory Visit: Payer: Self-pay | Admitting: Family Medicine

## 2016-06-03 ENCOUNTER — Other Ambulatory Visit: Payer: Self-pay | Admitting: Family Medicine

## 2016-06-19 ENCOUNTER — Other Ambulatory Visit: Payer: Self-pay | Admitting: Family Medicine

## 2016-07-17 ENCOUNTER — Ambulatory Visit (INDEPENDENT_AMBULATORY_CARE_PROVIDER_SITE_OTHER): Payer: BC Managed Care – PPO | Admitting: Family Medicine

## 2016-07-17 ENCOUNTER — Encounter: Payer: Self-pay | Admitting: Family Medicine

## 2016-07-17 ENCOUNTER — Ambulatory Visit (INDEPENDENT_AMBULATORY_CARE_PROVIDER_SITE_OTHER): Payer: BC Managed Care – PPO

## 2016-07-17 VITALS — BP 122/69 | HR 77 | Ht 66.0 in | Wt 153.0 lb

## 2016-07-17 DIAGNOSIS — M25521 Pain in right elbow: Secondary | ICD-10-CM | POA: Diagnosis not present

## 2016-07-17 DIAGNOSIS — E782 Mixed hyperlipidemia: Secondary | ICD-10-CM | POA: Diagnosis not present

## 2016-07-17 DIAGNOSIS — M797 Fibromyalgia: Secondary | ICD-10-CM

## 2016-07-17 DIAGNOSIS — Z1211 Encounter for screening for malignant neoplasm of colon: Secondary | ICD-10-CM

## 2016-07-17 DIAGNOSIS — I10 Essential (primary) hypertension: Secondary | ICD-10-CM | POA: Diagnosis not present

## 2016-07-17 DIAGNOSIS — G43009 Migraine without aura, not intractable, without status migrainosus: Secondary | ICD-10-CM

## 2016-07-17 DIAGNOSIS — M7711 Lateral epicondylitis, right elbow: Secondary | ICD-10-CM | POA: Diagnosis not present

## 2016-07-17 LAB — BASIC METABOLIC PANEL WITH GFR
BUN: 21 mg/dL (ref 7–25)
CHLORIDE: 109 mmol/L (ref 98–110)
CO2: 18 mmol/L — ABNORMAL LOW (ref 20–31)
Calcium: 9.4 mg/dL (ref 8.6–10.4)
Creat: 1.05 mg/dL (ref 0.50–1.05)
GFR, Est African American: 72 mL/min (ref >=60)
GFR, Est Non African American: 62 mL/min (ref >=60)
GLUCOSE: 78 mg/dL (ref 65–99)
POTASSIUM: 4.4 mmol/L (ref 3.5–5.3)
Sodium: 140 mmol/L (ref 135–146)

## 2016-07-17 LAB — LIPID PANEL
Cholesterol: 243 mg/dL — ABNORMAL HIGH (ref <200)
HDL: 48 mg/dL — ABNORMAL LOW (ref >50)
LDL Cholesterol: 160 mg/dL — ABNORMAL HIGH (ref <100)
TRIGLYCERIDES: 174 mg/dL — AB (ref <150)
Total CHOL/HDL Ratio: 5.1 Ratio — ABNORMAL HIGH (ref <5.0)
VLDL: 35 mg/dL — ABNORMAL HIGH (ref <30)

## 2016-07-17 NOTE — Progress Notes (Addendum)
Subjective:    Patient ID: Carol Diaz, female    DOB: October 27, 1965, 51 y.o.   MRN: [id number]  HPI 51 yo female with fibromyalgia comes in today for f/u on her migraines. She was seen a few weeks ago by our PA Charlie for Migraine. She gave her an IV infusion and her topamax was increased to .  She was also having some spasm around the paraspinous muscles and given flexeril to use TID for a short period of time. She is doing well on the increased dose of Topamax. No side effects such as numbness or tingling or paresthesias on it. She has had one more headaches since then but says it just lasted a day and she was able to take her Imitrex and it actually worked well.  Hypertension- Pt denies chest pain, SOB, dizziness, or heart palpitations.  Taking meds as directed w/o problems.  Denies medication side effects.    She also complains of right outer elbow pain. She has had lateral epicondylitis multiple times. About a week or so ago she actually was painting a room in her house that afterwards she said she was barely able to move her right elbow for several days. Essentially quite swollen today. We've never done x-rays that she's never had any formal injury.   Review of Systems  BP 122/69   Pulse 77   Ht  (1.676 m)   Wt 153 lb (69.4 kg)   SpO2 100%   BMI 24.69 kg/m     Allergies  Allergen Reactions  . Fluoxetine Other (See Comments)    Made her tremor worse   . Lexapro [Escitalopram Oxalate] Other (See Comments)    Headache  . Morphine Nausea And Vomiting  . Sertraline Other (See Comments)    Headache     Past Medical History:  Diagnosis Date  . Chronic fatigue syndrome   . Migraines   . Palpitations     Past Surgical History:  Procedure Laterality Date  . CESAREAN SECTION    . ivp    . TUBAL LIGATION      Social History   Social History  . Marital status: Married    Spouse name: N/A  . Number of children: 2  . Years of education: N/A    Occupational History  .  [employer]   Social History Main Topics  . Smoking status: Never Smoker  . Smokeless tobacco: Never Used  . Alcohol use No  . Drug use: No  . Sexual activity: Yes    Partners: Male    Birth control/ protection: Pill   Other Topics Concern  . Not on file   Social History Narrative   Some exercise.     Family History  Problem Relation Age of Onset  . Emphysema Mother   . Hypertension Father   . Heart disease Brother 93    mi    Outpatient Encounter Prescriptions as of 07/17/2016  Medication Sig  . cyclobenzaprine (FLEXERIL) 10 MG tablet take 1/2 to 1 tablet by mouth three times a day if needed for muscle spasm  . DULoxetine (CYMBALTA) 30 MG capsule Take 1 capsule (30 mg total) by mouth daily. After 2 weeks can increase to twice a day.  . fluticasone (FLONASE) 50 MCG/ACT nasal spray One spray in each nostril twice a day, use left hand for right nostril, and right hand for left nostril.  Marland Kitchen gabapentin (NEURONTIN) 100 MG capsule take 1 capsule by mouth every morning  then 1 every evening and 2 at bedtime IF TOLERATED  . LINZESS 290 MCG CAPS capsule take 1 capsule by mouth once daily 30 MINUTES BEFORE FIRST MEAL OF THE DAY  . metFORMIN (GLUCOPHAGE) 500 MG tablet take 1 tablet by mouth twice a day with meals  . metoCLOPramide (REGLAN) 10 MG tablet Take 1 tablet (10 mg total) by mouth 2 (two) times daily as needed (migraine).  . nabumetone (RELAFEN) 500 MG tablet take 1 tablet by mouth twice a day if needed  . omeprazole (PRILOSEC) 40 MG capsule Take 1 capsule (40 mg total) by mouth daily.  . propranolol ER (INDERAL LA) 60 MG 24 hr capsule take 1 capsule by mouth daily  . SETLAKIN 0.15-0.03 MG tablet take 1 tablet by mouth daily  . SUMAtriptan (IMITREX) 100 MG tablet Take 1 tablet (100 mg total) by mouth every 2 (two) hours as needed for migraine.  . topiramate (TOPAMAX) 50 MG tablet Take 1 tablet (50 mg total) by mouth at bedtime.    Facility-Administered Encounter Medications as of 07/17/2016  Medication  . dexamethasone (DECADRON) 4 mg in sodium chloride 0.9 % 50 mL IVPB  . metoCLOPramide (REGLAN) 10 mg in dextrose 5 % 50 mL IVPB         Objective:   Physical Exam  Constitutional: She is oriented to person, place, and time. She appears well-developed and well-nourished.  HENT:  Head: Normocephalic and atraumatic.  Cardiovascular: Normal rate, regular rhythm and normal heart sounds.   Pulmonary/Chest: Effort normal and breath sounds normal.  Musculoskeletal:  nontender ver the spine today. nontender over the SI joints/.   Neurological: She is alert and oriented to person, place, and time.  Skin: Skin is warm and dry.  Psychiatric: She has a normal mood and affect. Her behavior is normal.          Assessment & Plan:  Fibromyalgia - stable. We discussed working on Working on stretching on a regular basis every day. Even something like yoga could be very helpful for her. Was doing some DVDs previously but stopped doing them.  HTN - Well controlled. Continue current regimen. Follow up in  6 months.    Migraine HA - Continue Topamax 50 mg twice a day. F/U in 3 months.   Lateral epicondylitis-with significant swelling. I did discuss the risk of getting multiple injections over a long. At time. It actually thin the tendons and increased risk for rupture. At this point I like to get an x-ray. Call with results once available.  Also discussed need for colon cancer screening. We'll schedule for colonoscopy.

## 2016-07-23 ENCOUNTER — Other Ambulatory Visit: Payer: Self-pay | Admitting: Family Medicine

## 2016-07-28 ENCOUNTER — Encounter: Payer: Self-pay | Admitting: Family Medicine

## 2016-07-28 ENCOUNTER — Ambulatory Visit (INDEPENDENT_AMBULATORY_CARE_PROVIDER_SITE_OTHER): Payer: BC Managed Care – PPO | Admitting: Family Medicine

## 2016-07-28 VITALS — BP 136/76 | HR 86 | Ht 66.0 in | Wt 153.0 lb

## 2016-07-28 DIAGNOSIS — J0110 Acute frontal sinusitis, unspecified: Secondary | ICD-10-CM

## 2016-07-28 DIAGNOSIS — J018 Other acute sinusitis: Secondary | ICD-10-CM | POA: Diagnosis not present

## 2016-07-28 MED ORDER — AMOXICILLIN-POT CLAVULANATE 875-125 MG PO TABS: 1.0000 | ORAL_TABLET | Freq: Two times a day (BID) | ORAL | 0 refills | Status: DC

## 2016-07-28 MED ORDER — FLUTICASONE PROPIONATE 50 MCG/ACT NA SUSP: NASAL | 3 refills | Status: DC

## 2016-07-28 NOTE — Progress Notes (Signed)
   Subjective:    Patient ID: Carol Diaz, female    DOB: 12-07-1965, 51 y.o.   MRN: [id number]  HPI 51 year old female comes in today with 2 weeks of nasal congestion headaches. She's had some mild intermittent sore throat. No ear pain. No cough or chest congestion. She's mostly been using some over-the-counter cough and cold medications without significant relief. She is actually starting to feel nauseated the last couple of days from postnasal drip. She does need a refill on her Flonase as well.   Review of Systems  No fevers chills or sweats.     Objective:   Physical Exam  Constitutional: She is oriented to person, place, and time. She appears well-developed and well-nourished.  HENT:  Head: Normocephalic and atraumatic.  Right Ear: External ear normal.  Left Ear: External ear normal.  Nose: Nose normal.  Mouth/Throat: Oropharynx is clear and moist.  TMs and canals are clear. Left nares is mildly swollen  Eyes: Conjunctivae and EOM are normal. Pupils are equal, round, and reactive to light.  Neck: Neck supple. No thyromegaly present.  Cardiovascular: Normal rate, regular rhythm and normal heart sounds.   Pulmonary/Chest: Effort normal and breath sounds normal. She has no wheezes.  Lymphadenopathy:    She has no cervical adenopathy.  Neurological: She is alert and oriented to person, place, and time.  Skin: Skin is warm and dry.  Psychiatric: She has a normal mood and affect.          Assessment & Plan:  Acute sinusitis -   We'll treat with Augmentin. Restart Flonase. New prescription sent. Call if not significantly better by Tuesday. Okay to continue over-the-counter cough and cold medications.

## 2016-08-05 ENCOUNTER — Other Ambulatory Visit: Payer: Self-pay | Admitting: Physician Assistant

## 2016-08-05 ENCOUNTER — Other Ambulatory Visit: Payer: Self-pay | Admitting: Family Medicine

## 2016-08-05 DIAGNOSIS — G43009 Migraine without aura, not intractable, without status migrainosus: Secondary | ICD-10-CM

## 2016-08-16 ENCOUNTER — Encounter: Payer: Self-pay | Admitting: Family Medicine

## 2016-09-08 ENCOUNTER — Other Ambulatory Visit: Payer: Self-pay | Admitting: Family Medicine

## 2016-09-19 ENCOUNTER — Other Ambulatory Visit: Payer: Self-pay | Admitting: Family Medicine

## 2016-10-03 ENCOUNTER — Other Ambulatory Visit: Payer: Self-pay | Admitting: Family Medicine

## 2016-10-11 ENCOUNTER — Ambulatory Visit (INDEPENDENT_AMBULATORY_CARE_PROVIDER_SITE_OTHER): Payer: BC Managed Care – PPO | Admitting: Family Medicine

## 2016-10-11 ENCOUNTER — Encounter: Payer: Self-pay | Admitting: Family Medicine

## 2016-10-11 VITALS — BP 132/74 | HR 73 | Ht 66.0 in | Wt 154.0 lb

## 2016-10-11 DIAGNOSIS — M7061 Trochanteric bursitis, right hip: Secondary | ICD-10-CM

## 2016-10-11 DIAGNOSIS — M7062 Trochanteric bursitis, left hip: Secondary | ICD-10-CM

## 2016-10-11 DIAGNOSIS — M545 Low back pain, unspecified: Secondary | ICD-10-CM

## 2016-10-11 DIAGNOSIS — M797 Fibromyalgia: Secondary | ICD-10-CM

## 2016-10-11 DIAGNOSIS — Z1231 Encounter for screening mammogram for malignant neoplasm of breast: Secondary | ICD-10-CM | POA: Diagnosis not present

## 2016-10-11 DIAGNOSIS — Z1239 Encounter for other screening for malignant neoplasm of breast: Secondary | ICD-10-CM

## 2016-10-11 DIAGNOSIS — M542 Cervicalgia: Secondary | ICD-10-CM

## 2016-10-11 MED ORDER — PREDNISONE 20 MG PO TABS: 40.0000 mg | ORAL_TABLET | Freq: Every day | ORAL | 0 refills | Status: DC

## 2016-10-11 MED ORDER — DULOXETINE HCL 60 MG PO CPEP: 60.0000 mg | ORAL_CAPSULE | Freq: Every day | ORAL | 1 refills | Status: DC

## 2016-10-11 NOTE — Patient Instructions (Signed)
Sent over new script for the Cymblata 60mg  and the prednisone.  Start the stretches twice a day if able.

## 2016-10-11 NOTE — Progress Notes (Signed)
Subjective:    Patient ID: Carol Diaz, female    DOB: 1966-02-04, 51 y.o.   MRN: [id number]  HPI  51 yo female with fibromyalgia come in today c/o of not feeling well nor sleeping well. Says her pain is keeping her awake right now. She is on Cymbalta 30mg  daily.  She says in particular it is her hips and her low back that are bothering her. It's been going on for about 6 weeks. She thought it would just eventually improve and go away. She denies any known injury trauma heavy lifting lately she remembers. She gets a lot of stiffness particularly in her low back in the morning can last several hours. She ends up going and getting up and sleeping on the couch because painful to sleep on her hips and that is also painful to lay on her back. She is normally a side sleeper normally uses a pillow between the knees. She does take her nabumetone at bedtime every night.  Review of Systems BP 132/74   Pulse 73   Ht 5\' 6"  (1.676 m)   Wt 154 lb (69.9 kg)   BMI 24.86 kg/m     Allergies  Allergen Reactions  . Fluoxetine Other (See Comments)    Made her tremor worse   . Lexapro [Escitalopram Oxalate] Other (See Comments)    Headache  . Morphine Nausea And Vomiting  . Sertraline Other (See Comments)    Headache     Past Medical History:  Diagnosis Date  . Chronic fatigue syndrome   . Migraines   . Palpitations     Past Surgical History:  Procedure Laterality Date  . CESAREAN SECTION    . ivp    . TUBAL LIGATION      Social History   Social History  . Marital status: Married    Spouse name: N/A  . Number of children: 2  . Years of education: N/A   Occupational History  .  [employer]   Social History Main Topics  . Smoking status: Never Smoker  . Smokeless tobacco: Never Used  . Alcohol use No  . Drug use: No  . Sexual activity: Yes    Partners: Male    Birth control/ protection: Pill   Other Topics Concern  . Not on file   Social History Narrative    Some exercise.     Family History  Problem Relation Age of Onset  . Emphysema Mother   . Hypertension Father   . Heart disease Brother 88       mi    Outpatient Encounter Prescriptions as of 10/11/2016  Medication Sig  . cyclobenzaprine (FLEXERIL) 10 MG tablet TAKE 1/2 TO 1 TABLET BY MOUTH THREE TIMES DAILY IF NEEDED FOR MUSCLE SPASM  . DULoxetine (CYMBALTA) 60 MG capsule Take 1 capsule (60 mg total) by mouth daily. After 2 weeks can increase to twice a day.  . fluticasone (FLONASE) 50 MCG/ACT nasal spray One spray in each nostril twice a day, use left hand for right nostril, and right hand for left nostril.  Marland Kitchen gabapentin (NEURONTIN) 100 MG capsule take 1 capsule by mouth every morning then 1 every evening and 2 at bedtime IF TOLERATED  . LINZESS 290 MCG CAPS capsule take 1 capsule by mouth once daily 30 MINUTES BEFORE FIRST MEAL OF THE DAY  . metFORMIN (GLUCOPHAGE) 500 MG tablet Take 1 tablet (500 mg total) by mouth 2 (two) times daily with a meal. Due for  follow up visit  . metoCLOPramide (REGLAN) 10 MG tablet Take 1 tablet (10 mg total) by mouth 2 (two) times daily as needed (migraine).  . nabumetone (RELAFEN) 500 MG tablet take 1 tablet by mouth twice a day if needed  . omeprazole (PRILOSEC) 40 MG capsule Take 1 capsule (40 mg total) by mouth daily.  . propranolol ER (INDERAL LA) 60 MG 24 hr capsule take 1 capsule by mouth daily  . SETLAKIN 0.15-0.03 MG tablet take 1 tablet by mouth daily  . SUMAtriptan (IMITREX) 100 MG tablet take 1 tablet by mouth every 2 hours if needed for migraines as directed  . topiramate (TOPAMAX) 50 MG tablet take 1 tablet by mouth at bedtime  . [DISCONTINUED] DULoxetine (CYMBALTA) 30 MG capsule Take 1 capsule (30 mg total) by mouth daily. After 2 weeks can increase to twice a day.  . predniSONE (DELTASONE) 20 MG tablet Take 2 tablets (40 mg total) by mouth daily.  . [DISCONTINUED] amoxicillin-clavulanate (AUGMENTIN) 875-125 MG tablet Take 1 tablet by mouth  2 (two) times daily. (Patient not taking: Reported on 10/11/2016)   Facility-Administered Encounter Medications as of 10/11/2016  Medication  . dexamethasone (DECADRON) 4 mg in sodium chloride 0.9 % 50 mL IVPB  . metoCLOPramide (REGLAN) 10 mg in dextrose 5 % 50 mL IVPB         Objective:   Physical Exam  Constitutional: She is oriented to person, place, and time. She appears well-developed and well-nourished.  HENT:  Head: Normocephalic and atraumatic.  Eyes: Conjunctivae and EOM are normal.  Cardiovascular: Normal rate.   Pulmonary/Chest: Effort normal.  Musculoskeletal:  Normal lumbar flexion and extension and rotation right and left. That she did feel some discomfort at the full ranges of motion. She was able to do them. Nontender over the lumbar spine and paraspinous muscles. Nontender over the SI joints. Negative straight leg raise bilaterally. Hip, knee, ankle strength is 5 out of 5 bilaterally. Patellar reflexes 2+ bilaterally. He is very tender over the greater trochanters bilaterally hip abductor strength is 5 out of 5.  Neurological: She is alert and oriented to person, place, and time.  Skin: Skin is dry. No pallor.  Psychiatric: She has a normal mood and affect. Her behavior is normal.  Vitals reviewed.         Assessment & Plan:  Biateral hip bursitis- Discussed treatment. Recommend home physical therapy and stretches. She started taking nabumetone at bedtime. Will put her on 5 days of oral prednisone mostly for her low back but it may help with her bursitis as well. If not improving over the next several weeks then consider steroid injections.  Midline low back pain without sciatica-no sign of radiculopathy. Recommend formal physical therapy versus home stretches. She would like to start with the home stretches. If she is off of work for the next 2 weeks and feels like she will be able to do them consistently. Continue with number nabumetone at  bedtime.  Fibromyalgia-also discussed increasing her Cymbalta up to 60 mg. She would like try this. New prescription sent to the pharmacy. Follow-up in 4 weeks.

## 2016-10-13 ENCOUNTER — Other Ambulatory Visit: Payer: Self-pay | Admitting: Family Medicine

## 2016-10-16 MED ORDER — NABUMETONE 500 MG PO TABS: 500.0000 mg | ORAL_TABLET | Freq: Two times a day (BID) | ORAL | 0 refills | Status: DC | PRN

## 2016-10-27 ENCOUNTER — Other Ambulatory Visit: Payer: Self-pay | Admitting: Family Medicine

## 2016-11-06 ENCOUNTER — Other Ambulatory Visit: Payer: Self-pay | Admitting: Family Medicine

## 2016-11-06 ENCOUNTER — Encounter: Payer: Self-pay | Admitting: Family Medicine

## 2016-11-06 DIAGNOSIS — G43009 Migraine without aura, not intractable, without status migrainosus: Secondary | ICD-10-CM

## 2016-11-07 MED ORDER — METFORMIN HCL 500 MG PO TABS: 500.0000 mg | ORAL_TABLET | Freq: Two times a day (BID) | ORAL | 3 refills | Status: DC

## 2016-11-07 MED ORDER — TOPIRAMATE 50 MG PO TABS: 50.0000 mg | ORAL_TABLET | Freq: Every day | ORAL | 2 refills | Status: DC

## 2016-11-07 NOTE — Telephone Encounter (Signed)
Ok to cotinue for now.

## 2016-11-08 ENCOUNTER — Encounter: Payer: Self-pay | Admitting: Family Medicine

## 2016-11-08 ENCOUNTER — Ambulatory Visit (INDEPENDENT_AMBULATORY_CARE_PROVIDER_SITE_OTHER): Payer: BC Managed Care – PPO | Admitting: Family Medicine

## 2016-11-08 VITALS — BP 116/69 | HR 72 | Wt 154.0 lb

## 2016-11-08 DIAGNOSIS — M545 Low back pain, unspecified: Secondary | ICD-10-CM

## 2016-11-08 DIAGNOSIS — Z6824 Body mass index (BMI) 24.0-24.9, adult: Secondary | ICD-10-CM

## 2016-11-08 DIAGNOSIS — M7061 Trochanteric bursitis, right hip: Secondary | ICD-10-CM

## 2016-11-08 DIAGNOSIS — M7062 Trochanteric bursitis, left hip: Secondary | ICD-10-CM | POA: Diagnosis not present

## 2016-11-08 NOTE — Progress Notes (Signed)
   Subjective:    Patient ID: Carol Diaz, female    DOB: 1966-02-05, 51 y.o.   MRN: [id number]  HPI Fibromyalgia - She is doing a little bit better overall. She still not quite back down to her baseline level of pain. We did increase her Cymbalta to 60 mg. She's tolerating that well without any side effects or problems.  Bilateral trochanteric bursitis/low back pain-she says the prednisone did help. Though it did make her a little bit irritable the last couple days of the medication. She does feel like that-things back in the right direction. She still having some pain but not completely back to baseline but doing better overall. She's been trying to reduce her stretches at home.  She also wanted to discuss her weight today. She says she's not gaining but she's also not losing she tries to eat really healthy overall. She's not always able to exercise consistently because of her fibromyalgia.   Review of Systems     Objective:   Physical Exam  Constitutional: She is oriented to person, place, and time. She appears well-developed and well-nourished.  HENT:  Head: Normocephalic and atraumatic.  Cardiovascular: Normal rate, regular rhythm and normal heart sounds.   Pulmonary/Chest: Effort normal and breath sounds normal.  Neurological: She is alert and oriented to person, place, and time.  Skin: Skin is warm and dry.  Psychiatric: She has a normal mood and affect. Her behavior is normal.       Assessment & Plan:  Fibromyalgia-stable on current regimen. We'll continue with the 60 mg Cymbalta for now and follow-up in 3 months.  Bilateral trochanteric bursitis/low back pain-she has had some relief and is getting a little better. Encouraged her to continue work on her stretches. Also strongly encouraged her to start yoga to really help with her upper back neck and low back. She sits at a desk all day and does a lot of computer injury and I think this would help her on a day-to-day basis  even when she's having a flare she'll be able to at least do some stretches with yoga.  BMI 24-unhappy with her weight currently. Offered to refer her to nutritionist. Also encouraged her to start counting calories again. She's not really able to rely on consistent exercise to help burn calories that she is really have to focus on her diet

## 2016-11-15 ENCOUNTER — Ambulatory Visit: Payer: BC Managed Care – PPO

## 2016-12-08 ENCOUNTER — Other Ambulatory Visit: Payer: Self-pay | Admitting: Family Medicine

## 2016-12-11 NOTE — Telephone Encounter (Signed)
Ok to fill 

## 2016-12-19 ENCOUNTER — Other Ambulatory Visit: Payer: Self-pay | Admitting: Family Medicine

## 2017-02-03 ENCOUNTER — Other Ambulatory Visit: Payer: Self-pay | Admitting: Family Medicine

## 2017-02-03 DIAGNOSIS — G43009 Migraine without aura, not intractable, without status migrainosus: Secondary | ICD-10-CM

## 2017-02-12 ENCOUNTER — Ambulatory Visit: Payer: BC Managed Care – PPO | Admitting: Family Medicine

## 2017-02-12 ENCOUNTER — Encounter: Payer: Self-pay | Admitting: Family Medicine

## 2017-02-12 VITALS — BP 118/64 | HR 75 | Ht 66.0 in | Wt 154.0 lb

## 2017-02-12 DIAGNOSIS — M7062 Trochanteric bursitis, left hip: Secondary | ICD-10-CM

## 2017-02-12 DIAGNOSIS — Z23 Encounter for immunization: Secondary | ICD-10-CM

## 2017-02-12 DIAGNOSIS — F5101 Primary insomnia: Secondary | ICD-10-CM

## 2017-02-12 DIAGNOSIS — M7061 Trochanteric bursitis, right hip: Secondary | ICD-10-CM | POA: Diagnosis not present

## 2017-02-12 DIAGNOSIS — M797 Fibromyalgia: Secondary | ICD-10-CM

## 2017-02-12 MED ORDER — SUVOREXANT 10 MG PO TABS: 10.0000 mg | ORAL_TABLET | Freq: Every day | ORAL | 0 refills | Status: DC

## 2017-02-12 MED ORDER — SUVOREXANT 10 MG PO TABS
10.0000 mg | ORAL_TABLET | Freq: Every day | ORAL | 0 refills | Status: DC
Start: 2017-02-12 — End: 2017-02-12

## 2017-02-12 NOTE — Progress Notes (Signed)
Subjective:    Patient ID: Carol Diaz, female    DOB: 12/26/1965, 51 y.o.   MRN: [id number]  HPI 3 mo f/u for fibromylagia -she is here today to follow-up for her fibromyalgia.  She is doing okay overall but still having a lot of bilateral hip pain that she did start physical therapy.  She is taking her medications regularly without any side effects or problems.  She just feels like she has had more aches since the weather has gotten colder.  She says this is very typical for her her pain gets worse in the wintertime.   Insomnia - she is still not sleeping well.  This is been a persistent issue.  She does take a muscle relaxer at bedtime but has been worse lately.  She has started physical therapy for her bilateral hip bursitis.  She has been to 3 sessions so far.  They are also working on her neck and they have even done some dry needling.  So far she has had somewhat has been gentle and has been okay to work with. She plans to continue with it.    Review of Systems  BP 118/64   Pulse 75   Ht 5\' 6"  (1.676 m)   Wt 154 lb (69.9 kg)   SpO2 100%   BMI 24.86 kg/m     Allergies  Allergen Reactions  . Fluoxetine Other (See Comments)    Made her tremor worse   . Lexapro [Escitalopram Oxalate] Other (See Comments)    Headache  . Morphine Nausea And Vomiting  . Sertraline Other (See Comments)    Headache     Past Medical History:  Diagnosis Date  . Chronic fatigue syndrome   . Migraines   . Palpitations     Past Surgical History:  Procedure Laterality Date  . CESAREAN SECTION    . ivp    . TUBAL LIGATION      Social History   Socioeconomic History  . Marital status: Married    Spouse name: Not on file  . Number of children: 2  . Years of education: Not on file  . Highest education level: Not on file  Social Needs  . Financial resource strain: Not on file  . Food insecurity - worry: Not on file  . Food insecurity - inability: Not on file  . Transportation  needs - medical: Not on file  . Transportation needs - non-medical: Not on file  Occupational History    Employer: [employer]  Tobacco Use  . Smoking status: Never Smoker  . Smokeless tobacco: Never Used  Substance and Sexual Activity  . Alcohol use: No  . Drug use: No  . Sexual activity: Yes    Partners: Male    Birth control/protection: Pill  Other Topics Concern  . Not on file  Social History Narrative   Some exercise.     Family History  Problem Relation Age of Onset  . Emphysema Mother   . Hypertension Father   . Heart disease Brother 3250       mi    Outpatient Encounter Medications as of 02/12/2017  Medication Sig  . cyclobenzaprine (FLEXERIL) 10 MG tablet TAKE 1/2 TO 1 TABLET BY MOUTH THREE TIMES DAILY AS NEEDED FOR MUSCLE SPASM  . DULoxetine (CYMBALTA) 60 MG capsule Take 1 capsule (60 mg total) by mouth daily. After 2 weeks can increase to twice a day.  . fluticasone (FLONASE) 50 MCG/ACT nasal spray One spray  in each nostril twice a day, use left hand for right nostril, and right hand for left nostril.  Marland Kitchen. gabapentin (NEURONTIN) 100 MG capsule take 1 capsule by mouth every morning then 1 every evening and 2 at bedtime IF TOLERATED  . LINZESS 290 MCG CAPS capsule TAKE 1 CAPSULE BY MOUTH DAILY 30 MINUTES BEFORE FIRST MEAL OF THE DAY  . metFORMIN (GLUCOPHAGE) 500 MG tablet Take 1 tablet (500 mg total) by mouth 2 (two) times daily with a meal.  . metoCLOPramide (REGLAN) 10 MG tablet Take 1 tablet (10 mg total) by mouth 2 (two) times daily as needed (migraine).  . nabumetone (RELAFEN) 500 MG tablet TAKE 1 TABLET(500 MG) BY MOUTH TWICE DAILY AS NEEDED  . omeprazole (PRILOSEC) 40 MG capsule Take 1 capsule (40 mg total) by mouth daily.  . propranolol ER (INDERAL LA) 60 MG 24 hr capsule take 1 capsule by mouth daily  . SETLAKIN 0.15-0.03 MG tablet take 1 tablet by mouth daily  . SUMAtriptan (IMITREX) 100 MG tablet take 1 tablet by mouth every 2 hours if needed for  migraines as directed  . topiramate (TOPAMAX) 50 MG tablet TAKE 1 TABLET(50 MG) BY MOUTH AT BEDTIME  . Suvorexant (BELSOMRA) 10 MG TABS Take 10 mg at bedtime by mouth.  . [DISCONTINUED] Suvorexant (BELSOMRA) 10 MG TABS Take 10 mg at bedtime by mouth.   Facility-Administered Encounter Medications as of 02/12/2017  Medication  . dexamethasone (DECADRON) 4 mg in sodium chloride 0.9 % 50 mL IVPB  . metoCLOPramide (REGLAN) 10 mg in dextrose 5 % 50 mL IVPB         Objective:   Physical Exam  Constitutional: She is oriented to person, place, and time. She appears well-developed and well-nourished.  HENT:  Head: Normocephalic and atraumatic.  Cardiovascular: Normal rate, regular rhythm and normal heart sounds.  Pulmonary/Chest: Effort normal and breath sounds normal.  Neurological: She is alert and oriented to person, place, and time.  Skin: Skin is warm and dry.  Psychiatric: She has a normal mood and affect. Her behavior is normal.          Assessment & Plan:  Fibromyalgia -continue current regimen.  Discussed the possibility of light therapy to help improve her mood as well.  Follow-up in 3 months.  Insomnia -discussed a possible trial of Belsomra.  We have never actually tried her on a sleep aid.  Low up in 3 months.  Trochanteric bursitis of both hips. -Continue with physical therapy.  I think this could be extremely helpful for her in the long run.  Also discussed the possibility of light therapy.

## 2017-03-03 ENCOUNTER — Other Ambulatory Visit: Payer: Self-pay | Admitting: Family Medicine

## 2017-03-03 DIAGNOSIS — G43009 Migraine without aura, not intractable, without status migrainosus: Secondary | ICD-10-CM

## 2017-05-15 ENCOUNTER — Observation Stay (HOSPITAL_COMMUNITY)
Admission: EM | Admit: 2017-05-15 | Discharge: 2017-05-17 | Disposition: A | Discharge status: Home / Self Care | Payer: BC Managed Care – PPO | Attending: General Surgery | Admitting: General Surgery

## 2017-05-15 ENCOUNTER — Encounter (HOSPITAL_COMMUNITY): Payer: Self-pay | Admitting: Emergency Medicine

## 2017-05-15 ENCOUNTER — Other Ambulatory Visit: Payer: Self-pay

## 2017-05-15 ENCOUNTER — Ambulatory Visit: Payer: BC Managed Care – PPO | Admitting: Sports Medicine

## 2017-05-15 ENCOUNTER — Ambulatory Visit (INDEPENDENT_AMBULATORY_CARE_PROVIDER_SITE_OTHER): Payer: BC Managed Care – PPO

## 2017-05-15 ENCOUNTER — Ambulatory Visit: Payer: BC Managed Care – PPO | Admitting: Family Medicine

## 2017-05-15 DIAGNOSIS — G4700 Insomnia, unspecified: Secondary | ICD-10-CM | POA: Insufficient documentation

## 2017-05-15 DIAGNOSIS — N182 Chronic kidney disease, stage 2 (mild): Secondary | ICD-10-CM | POA: Insufficient documentation

## 2017-05-15 DIAGNOSIS — R1031 Right lower quadrant pain: Secondary | ICD-10-CM | POA: Diagnosis not present

## 2017-05-15 DIAGNOSIS — F329 Major depressive disorder, single episode, unspecified: Secondary | ICD-10-CM | POA: Insufficient documentation

## 2017-05-15 DIAGNOSIS — R1084 Generalized abdominal pain: Secondary | ICD-10-CM

## 2017-05-15 DIAGNOSIS — Z888 Allergy status to other drugs, medicaments and biological substances status: Secondary | ICD-10-CM | POA: Diagnosis not present

## 2017-05-15 DIAGNOSIS — R109 Unspecified abdominal pain: Secondary | ICD-10-CM | POA: Diagnosis present

## 2017-05-15 DIAGNOSIS — Z7951 Long term (current) use of inhaled steroids: Secondary | ICD-10-CM | POA: Diagnosis not present

## 2017-05-15 DIAGNOSIS — N736 Female pelvic peritoneal adhesions (postinfective): Secondary | ICD-10-CM | POA: Diagnosis not present

## 2017-05-15 DIAGNOSIS — K589 Irritable bowel syndrome without diarrhea: Secondary | ICD-10-CM | POA: Diagnosis not present

## 2017-05-15 DIAGNOSIS — G250 Essential tremor: Secondary | ICD-10-CM | POA: Diagnosis not present

## 2017-05-15 DIAGNOSIS — M797 Fibromyalgia: Secondary | ICD-10-CM | POA: Insufficient documentation

## 2017-05-15 DIAGNOSIS — Z79899 Other long term (current) drug therapy: Secondary | ICD-10-CM | POA: Diagnosis not present

## 2017-05-15 DIAGNOSIS — Z791 Long term (current) use of non-steroidal anti-inflammatories (NSAID): Secondary | ICD-10-CM | POA: Insufficient documentation

## 2017-05-15 DIAGNOSIS — Z6824 Body mass index (BMI) 24.0-24.9, adult: Secondary | ICD-10-CM | POA: Diagnosis not present

## 2017-05-15 DIAGNOSIS — Z7984 Long term (current) use of oral hypoglycemic drugs: Secondary | ICD-10-CM | POA: Insufficient documentation

## 2017-05-15 DIAGNOSIS — E669 Obesity, unspecified: Secondary | ICD-10-CM | POA: Insufficient documentation

## 2017-05-15 DIAGNOSIS — K3580 Unspecified acute appendicitis: Secondary | ICD-10-CM

## 2017-05-15 DIAGNOSIS — G43909 Migraine, unspecified, not intractable, without status migrainosus: Secondary | ICD-10-CM | POA: Insufficient documentation

## 2017-05-15 DIAGNOSIS — Z885 Allergy status to narcotic agent status: Secondary | ICD-10-CM | POA: Diagnosis not present

## 2017-05-15 DIAGNOSIS — R5382 Chronic fatigue, unspecified: Secondary | ICD-10-CM | POA: Insufficient documentation

## 2017-05-15 DIAGNOSIS — I129 Hypertensive chronic kidney disease with stage 1 through stage 4 chronic kidney disease, or unspecified chronic kidney disease: Secondary | ICD-10-CM | POA: Insufficient documentation

## 2017-05-15 DIAGNOSIS — Z8249 Family history of ischemic heart disease and other diseases of the circulatory system: Secondary | ICD-10-CM | POA: Insufficient documentation

## 2017-05-15 LAB — CBC WITH DIFFERENTIAL/PLATELET
Basophils Absolute: 30 {cells}/uL (ref 0–200)
Basophils Relative: 0.4 %
Eosinophils Absolute: 120 {cells}/uL (ref 15–500)
Eosinophils Relative: 1.6 %
HCT: 39.1 % (ref 35.0–45.0)
Hemoglobin: 13.3 g/dL (ref 11.7–15.5)
Lymphs Abs: 2385 cells/uL (ref 850–3900)
MCH: 30.5 pg (ref 27.0–33.0)
MCHC: 34 g/dL (ref 32.0–36.0)
MCV: 89.7 fL (ref 80.0–100.0)
MPV: 9.6 fL (ref 7.5–12.5)
Monocytes Relative: 6.7 %
Neutro Abs: 4463 cells/uL (ref 1500–7800)
Neutrophils Relative %: 59.5 %
Platelets: 317 Thousand/uL (ref 140–400)
RBC: 4.36 10*6/uL (ref 3.80–5.10)
RDW: 12.8 % (ref 11.0–15.0)
Total Lymphocyte: 31.8 %
WBC mixed population: 503 cells/uL (ref 200–950)
WBC: 7.5 10*3/uL (ref 3.8–10.8)

## 2017-05-15 LAB — URINALYSIS
Bilirubin Urine: NEGATIVE
Glucose, UA: NEGATIVE
Ketones, ur: NEGATIVE
Leukocytes, UA: NEGATIVE
Nitrite: NEGATIVE
Protein, ur: NEGATIVE
Specific Gravity, Urine: 1.028 (ref 1.001–1.03)
pH: <=5 (ref 5.0–8.0)

## 2017-05-15 LAB — COMPREHENSIVE METABOLIC PANEL WITH GFR
ALT: 25 U/L (ref 6–29)
AST: 18 U/L (ref 10–35)
Creat: 1.02 mg/dL (ref 0.50–1.05)
Glucose, Bld: 82 mg/dL (ref 65–99)

## 2017-05-15 LAB — COMPREHENSIVE METABOLIC PANEL
AG Ratio: 1.5 (calc) (ref 1.0–2.5)
Albumin: 4 g/dL (ref 3.6–5.1)
Alkaline phosphatase (APISO): 56 U/L (ref 33–130)
BUN: 23 mg/dL (ref 7–25)
CO2: 21 mmol/L (ref 20–32)
Calcium: 9.3 mg/dL (ref 8.6–10.4)
Chloride: 109 mmol/L (ref 98–110)
Globulin: 2.7 g/dL (calc) (ref 1.9–3.7)
Potassium: 4.3 mmol/L (ref 3.5–5.3)
Sodium: 137 mmol/L (ref 135–146)
Total Bilirubin: 0.3 mg/dL (ref 0.2–1.2)
Total Protein: 6.7 g/dL (ref 6.1–8.1)

## 2017-05-15 LAB — AMYLASE: Amylase: 87 U/L (ref 21–101)

## 2017-05-15 LAB — LIPASE: Lipase: 13 U/L (ref 7–60)

## 2017-05-15 MED ORDER — KCL IN DEXTROSE-NACL 20-5-0.45 MEQ/L-%-% IV SOLN
INTRAVENOUS | Status: DC
  Administered 2017-05-15 – 2017-05-16 (×3): via INTRAVENOUS
  Filled 2017-05-15 (×5): qty 1000

## 2017-05-15 MED ORDER — ONDANSETRON HCL 4 MG/2ML IJ SOLN
4.0000 mg | Freq: Four times a day (QID) | INTRAMUSCULAR | Status: DC | PRN
  Filled 2017-05-15: qty 2

## 2017-05-15 MED ORDER — DIPHENOXYLATE-ATROPINE 2.5-0.025 MG PO TABS: ORAL_TABLET | ORAL | 3 refills | Status: DC

## 2017-05-15 MED ORDER — IOPAMIDOL (ISOVUE-300) INJECTION 61%
100.0000 mL | Freq: Once | INTRAVENOUS | Status: AC | PRN
  Administered 2017-05-15: 100 mL via INTRAVENOUS

## 2017-05-15 MED ORDER — ACETAMINOPHEN 650 MG RE SUPP: 650.0000 mg | Freq: Four times a day (QID) | RECTAL | Status: DC | PRN

## 2017-05-15 MED ORDER — PANTOPRAZOLE SODIUM 40 MG IV SOLR
40.0000 mg | Freq: Every day | INTRAVENOUS | Status: DC
  Administered 2017-05-15 – 2017-05-16 (×2): 40 mg via INTRAVENOUS
  Filled 2017-05-15 (×3): qty 40

## 2017-05-15 MED ORDER — HYDROMORPHONE HCL 1 MG/ML IJ SOLN
1.0000 mg | INTRAMUSCULAR | Status: AC | PRN
  Administered 2017-05-15: 1 mg via INTRAVENOUS
  Filled 2017-05-15: qty 1

## 2017-05-15 MED ORDER — PROMETHAZINE HCL 25 MG PO TABS: 25.0000 mg | ORAL_TABLET | Freq: Four times a day (QID) | ORAL | 3 refills | Status: DC | PRN

## 2017-05-15 MED ORDER — ACETAMINOPHEN 325 MG PO TABS
650.0000 mg | ORAL_TABLET | Freq: Four times a day (QID) | ORAL | Status: DC | PRN
  Administered 2017-05-17 (×2): 650 mg via ORAL
  Filled 2017-05-15 (×3): qty 2

## 2017-05-15 MED ORDER — TRAMADOL HCL 50 MG PO TABS
50.0000 mg | ORAL_TABLET | Freq: Four times a day (QID) | ORAL | Status: DC | PRN
  Administered 2017-05-15 – 2017-05-17 (×6): 50 mg via ORAL
  Filled 2017-05-15 (×7): qty 1

## 2017-05-15 MED ORDER — ONDANSETRON 4 MG PO TBDP: 4.0000 mg | ORAL_TABLET | Freq: Four times a day (QID) | ORAL | Status: DC | PRN

## 2017-05-15 MED ORDER — ONDANSETRON 8 MG PO TBDP: 8.0000 mg | ORAL_TABLET | Freq: Three times a day (TID) | ORAL | 3 refills | Status: DC | PRN

## 2017-05-15 NOTE — Progress Notes (Signed)
Pt gave permission to ask questions on nursing admission history while her husband was present in the room. Briscoe Burns. Carleane Prezley Qadir BSN, RN-BC Admissions RN 05/15/2017 8:57 PM

## 2017-05-15 NOTE — Progress Notes (Addendum)
Subjective:    CC: Abdominal pain, diarrhea  HPI: This is a pleasant 52 year old female, for the past several days she has had increasing right lower quadrant abdominal pain, nausea, diarrhea approximately 6 times per day, there is a little bit of bright red blood in it.  Headaches, chills.  Symptoms are moderate, persistent.  Multiple sick contacts at work, no undercooked beef, chicken or any foods that are new to her have been consumed.  No skin rash.  I reviewed the past medical history, family history, social history, surgical history, and allergies today and no changes were needed.  Please see the problem list section below in epic for further details.  Past Medical History: Past Medical History:  Diagnosis Date  . Chronic fatigue syndrome   . Hypertension   . Migraines   . Palpitations    Past Surgical History: Past Surgical History:  Procedure Laterality Date  . CESAREAN SECTION    . ivp    . TUBAL LIGATION     Social History: Social History   Socioeconomic History  . Marital status: Married    Spouse name: Not on file  . Number of children: 2  . Years of education: Not on file  . Highest education level: Not on file  Social Needs  . Financial resource strain: Not on file  . Food insecurity - worry: Not on file  . Food insecurity - inability: Not on file  . Transportation needs - medical: Not on file  . Transportation needs - non-medical: Not on file  Occupational History    Employer: [employer]  Tobacco Use  . Smoking status: Never Smoker  . Smokeless tobacco: Never Used  Substance and Sexual Activity  . Alcohol use: No  . Drug use: No  . Sexual activity: Yes    Partners: Male    Birth control/protection: Pill  Other Topics Concern  . Not on file  Social History Narrative   Some exercise.    Family History: Family History  Problem Relation Age of Onset  . Emphysema Mother   . Hypertension Father   . Heart disease Brother 5650       mi     Allergies: Allergies  Allergen Reactions  . Fluoxetine Other (See Comments)    Made her tremor worse   . Lexapro [Escitalopram Oxalate] Other (See Comments)    Headache  . Morphine Nausea And Vomiting  . Sertraline Other (See Comments)    Headache    Medications: See med rec.  Review of Systems: No fevers, chills, night sweats, weight loss, chest pain, or shortness of breath.   Objective:    General: Well Developed, well nourished, and in no acute distress.  Neuro: Alert and oriented x3, extra-ocular muscles intact, sensation grossly intact.  HEENT: Normocephalic, atraumatic, pupils equal round reactive to light, neck supple, no masses, no lymphadenopathy, thyroid nonpalpable.  Skin: Warm and dry, no rashes. Cardiac: Regular rate and rhythm, no murmurs rubs or gallops, no lower extremity edema.  Respiratory: Clear to auscultation bilaterally. Not using accessory muscles, speaking in full sentences. Abdomen: Soft, tender to palpation in the right lower quadrant with mild guarding, no rebound tenderness to suggest peritonitis, nondistended.  No overt diffuse rigidity.  Impression and Recommendations:    Acute abdominal pain in right lower quadrant Stat labs, CT abdomen and pelvis with IV and oral contrast. Also checking stool cultures, C. difficile. Question appendicitis. If negative we will add some Cipro. Also going to add some Phenergan,  Zofran, Lomotil.  CT abdomen and pelvis with oral and IV contrast shows an acute appendicitis, she is clinically stable, no leukocytosis, no fever. I discussed this with general surgeon Dr. Daphine Deutscher, he is going to relay the information to Dr. Gerrit Friends who is on-call for [facility] tonight, patient is headed to [facility] emergency department now.  I spent 25 minutes with this patient, greater than 50% was face-to-face time counseling regarding the above diagnoses ___________________________________________ Ihor Austin. Benjamin Stain, M.D.,  ABFM., CAQSM. Primary Care and Sports Medicine [facility]  Adjunct Instructor of Family Medicine  [employer]

## 2017-05-15 NOTE — Assessment & Plan Note (Addendum)
Stat labs, CT abdomen and pelvis with IV and oral contrast. Also checking stool cultures, C. difficile. Question appendicitis. If negative we will add some Cipro. Also going to add some Phenergan, Zofran, Lomotil.  CT abdomen and pelvis with oral and IV contrast shows an acute appendicitis, she is clinically stable, no leukocytosis, no fever. I discussed this with general surgeon Dr. Daphine DeutscherMartin, he is going to relay the information to Dr. Gerrit FriendsGerkin who [facility], [facility] now.

## 2017-05-15 NOTE — ED Provider Notes (Signed)
[facility]-EMERGENCY DEPT Provider Note   CSN: [id number] Arrival date & time: 05/15/17  1912     History   Chief Complaint Chief Complaint  Patient presents with  . Appendicitis    HPI Carol Diaz is a 52 y.o. female.  The history is provided by the patient and medical records. No language interpreter was used.   Carol Diaz is a 52 y.o. female  with a PMH of HTN who presents to the Emergency Department complaining of persistent, worsening right lower quadrant abdominal pain over the last several days.  Associated symptoms include nausea and diarrhea.  No fever or chills.  Seen by PCP earlier today who obtained labs and CT scan. CT shows acute appendicitis. Patient was instructed to come to ER for further evaluation.    Past Medical History:  Diagnosis Date  . Chronic fatigue syndrome   . Hypertension   . Migraines   . Palpitations     Patient Active Problem List   Diagnosis Date Noted  . Acute abdominal pain in right lower quadrant 05/15/2017  . Fibromyalgia 07/17/2016  . CKD (chronic kidney disease) stage 2, GFR 60-89 ml/min 09/27/2015  . Neck pain 06/10/2015  . Obesity 09/25/2013  . Essential tremor 02/02/2012  . Hyperlipidemia 12/15/2011  . GROSS HEMATURIA 04/06/2010  . Iron deficiency anemia 09/22/2009  . PALPITATIONS 09/22/2009  . FATIGUE 08/31/2009  . ESSENTIAL HYPERTENSION, BENIGN 12/11/2008  . HYPOGLYCEMIA, UNSPECIFIED 11/20/2008  . MENORRHAGIA 09/05/2007  . TREMOR 03/13/2007  . DEPRESSION, MAJOR, RECURRENT 01/02/2006  . Migraine headache 01/02/2006  . IRRITABLE BOWEL SYNDROME 01/02/2006  . INSOMNIA NOS 01/02/2006    Past Surgical History:  Procedure Laterality Date  . CESAREAN SECTION    . ivp    . TUBAL LIGATION      OB History    Gravida Para Term Preterm AB Living   2 2 2     2    SAB TAB Ectopic Multiple Live Births                   Home Medications    Prior to Admission medications   Medication Sig  Start Date End Date Taking? Authorizing Provider  cyclobenzaprine (FLEXERIL) 10 MG tablet TAKE 1/2 TO 1 TABLET BY MOUTH THREE TIMES DAILY AS NEEDED FOR MUSCLE SPASM 12/11/16   Agapito GamesMetheney, Catherine D, MD  diphenoxylate-atropine (LOMOTIL) 2.5-0.025 MG tablet One to 2 tablets by mouth 4 times a day as needed for diarrhea. 05/15/17   Monica Bectonhekkekandam, Thomas J, MD  DULoxetine (CYMBALTA) 60 MG capsule Take 1 capsule (60 mg total) by mouth daily. After 2 weeks can increase to twice a day. 10/11/16   Agapito GamesMetheney, Catherine D, MD  fluticasone (FLONASE) 50 MCG/ACT nasal spray One spray in each nostril twice a day, use left hand for right nostril, and right hand for left nostril. 07/28/16   Agapito GamesMetheney, Catherine D, MD  gabapentin (NEURONTIN) 100 MG capsule take 1 capsule by mouth every morning then 1 every evening and 2 at bedtime IF TOLERATED 01/15/15   Agapito GamesMetheney, Catherine D, MD  LINZESS 290 MCG CAPS capsule TAKE 1 CAPSULE BY MOUTH DAILY 30 MINUTES BEFORE FIRST MEAL OF THE DAY 12/20/16   Agapito GamesMetheney, Catherine D, MD  metFORMIN (GLUCOPHAGE) 500 MG tablet TAKE 1 TABLET(500 MG) BY MOUTH TWICE DAILY WITH A MEAL 03/05/17   Agapito GamesMetheney, Catherine D, MD  metoCLOPramide (REGLAN) 10 MG tablet Take 1 tablet (10 mg total) by mouth 2 (two) times daily as needed (  migraine). 05/10/16   Carlis Stable, PA-C  nabumetone (RELAFEN) 500 MG tablet TAKE 1 TABLET(500 MG) BY MOUTH TWICE DAILY AS NEEDED 12/11/16   Agapito Games, MD  omeprazole (PRILOSEC) 40 MG capsule Take 1 capsule (40 mg total) by mouth daily. 09/24/15   Agapito Games, MD  ondansetron (ZOFRAN-ODT) 8 MG disintegrating tablet Take 1 tablet (8 mg total) by mouth every 8 (eight) hours as needed for nausea. 05/15/17   Monica Becton, MD  promethazine (PHENERGAN) 25 MG tablet Take 1 tablet (25 mg total) by mouth every 6 (six) hours as needed for nausea. 05/15/17   Monica Becton, MD  propranolol ER (INDERAL LA) 60 MG 24 hr capsule take 1 capsule by mouth  daily 06/20/16   Agapito Games, MD  [facility] 0.15-0.03 MG tablet take 1 tablet by mouth daily 04/18/16   Agapito Games, MD  SUMAtriptan (IMITREX) 100 MG tablet take 1 tablet by mouth every 2 hours if needed for migraines as directed 09/20/16   Agapito Games, MD  Suvorexant (BELSOMRA) 10 MG TABS Take 10 mg at bedtime by mouth. 02/12/17   Agapito Games, MD  topiramate (TOPAMAX) 50 MG tablet TAKE 1 TABLET(50 MG) BY MOUTH AT BEDTIME 03/05/17   Agapito Games, MD    Family History Family History  Problem Relation Age of Onset  . Emphysema Mother   . Hypertension Father   . Heart disease Brother 31       mi    Social History Social History   Tobacco Use  . Smoking status: Never Smoker  . Smokeless tobacco: Never Used  Substance Use Topics  . Alcohol use: No  . Drug use: No     Allergies   Fluoxetine; Lexapro [escitalopram oxalate]; Morphine; and Sertraline   Review of Systems Review of Systems  Constitutional: Negative for chills and fever.  Gastrointestinal: Positive for abdominal pain, diarrhea and nausea. Negative for vomiting.  All other systems reviewed and are negative.    Physical Exam Updated Vital Signs BP 135/83 (BP Location: Left Arm)   Pulse 68   Temp 98.4 F (36.9 C) (Oral)   Resp 16   SpO2 100%   Physical Exam  Constitutional: She is oriented to person, place, and time. She appears well-developed and well-nourished. No distress.  HENT:  Head: Normocephalic and atraumatic.  Neck: Neck supple.  Cardiovascular: Normal rate, regular rhythm and normal heart sounds.  No murmur heard. Pulmonary/Chest: Effort normal and breath sounds normal. No respiratory distress.  Abdominal: Soft. She exhibits no distension. There is tenderness (RLQ).  Neurological: She is alert and oriented to person, place, and time.  Skin: Skin is warm and dry.  Nursing note and vitals reviewed.    ED Treatments / Results  Labs (all labs  ordered are listed, but only abnormal results are displayed) Labs Reviewed - No data to display  EKG  EKG Interpretation None       Radiology Ct Abdomen Pelvis W Contrast  Result Date: 05/15/2017 CLINICAL DATA:  Acute right lower quadrant abdominal pain. EXAM: CT ABDOMEN AND PELVIS WITH CONTRAST TECHNIQUE: Multidetector CT imaging of the abdomen and pelvis was performed using the standard protocol following bolus administration of intravenous contrast. CONTRAST:  ISOVUE-300 IOPAMIDOL (ISOVUE-300) INJECTION 61% COMPARISON:  None. FINDINGS: Lower chest: No acute abnormality. Hepatobiliary: No focal liver abnormality is seen. No gallstones, gallbladder wall thickening, or biliary dilatation. Pancreas: Unremarkable. No pancreatic ductal dilatation or surrounding inflammatory changes. Spleen:  Normal in size without focal abnormality. Adrenals/Urinary Tract: Adrenal glands are unremarkable. Kidneys are normal, without renal calculi, focal lesion, or hydronephrosis. Bladder is unremarkable. Stomach/Bowel: The stomach appears normal. There is no evidence of bowel obstruction. The appendix is thick-walled and enlarged at 9 mm, although no significant surrounding inflammation is noted. It is retrocecal in position. Vascular/Lymphatic: No significant vascular findings are present. No enlarged abdominal or pelvic lymph nodes. Reproductive: Uterus and bilateral adnexa are unremarkable. Other: No abdominal wall hernia or abnormality. No abdominopelvic ascites. Musculoskeletal: No acute or significant osseous findings. IMPRESSION: The appendix is thick-walled and enlarged at 9 mm in diameter, although no surrounding inflammation is noted. These findings are equivocal for acute appendicitis, and correlation with laboratory and clinical findings is recommended. Electronically Signed   By: Lupita Raider, M.D.   On: 05/15/2017 14:22    Procedures Procedures (including critical care time)  Medications Ordered  in ED Medications - No data to display   Initial Impression / Assessment and Plan / ED Course  I have reviewed the triage vital signs and the nursing notes.  Pertinent labs & imaging results that were available during my care of the patient were reviewed by me and considered in my medical decision making (see chart for details).    Carol Diaz is a 51 y.o. female who presents to ED for RLQ abdominal pain. Outpatient labs and imaging obtained by PCP earlier today were reviewed. CT shows acute appendicitis. Labs with white count of 7.5. General surgery consulted who will admit.  Final Clinical Impressions(s) / ED Diagnoses   Final diagnoses:  Acute appendicitis, unspecified acute appendicitis type    ED Discharge Orders    None       Soleil Mas, Chase Picket, PA-C 05/15/17 2042    Raeford Razor, MD 05/15/17 2105

## 2017-05-15 NOTE — H&P (Signed)
Carol Diaz is an 52 y.o. female.    General Surgery [facility]  Chief Complaint: abdominal pain, cramps, diarrhea  HPI: Patient is a 52 year old white female referred by her primary care physician for surgical evaluation of possible acute appendicitis.  Patient has had abdominal discomfort, crampy abdominal pain, and diarrheal stools for the past 4-5 days.  She notes that pain is worse with eating.  She ate a normal lunch time meal today at approximately 2 PM.  She has had mild nausea this afternoon.  She denies emesis.  She denies fevers or chills or sweats.  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  Patient was evaluated this morning at the office of her primary care physician, Dr. Alden Hippatherine Metheny.  She was seen by Dr. Karie Schwalbe at that office.  Laboratory studies were obtained which show a normal white blood cell count of 7.5.  Differential was normal.  Vital signs are normal and temperature is currently normal at 98.4.  Patient was referred to radiology where a CT scan of the abdomen was performed.  This demonstrated a mildly thickened 9 mm appendix in a retrocecal location.  There were no acute inflammatory changes.  There were no other acute findings within the abdomen.  Patient has had prior cesarean section on 2 occasions.  No other abdominal surgery.  Patient does have a history of irritable bowel syndrome and fibromyalgia managed by her primary care physician.  She has not had a colonoscopy.  Patient presents to the emergency department accompanied by her husband for surgical evaluation for possible acute appendicitis.  Past Medical History:  Diagnosis Date  . Chronic fatigue syndrome   . Hypertension   . Migraines   . Palpitations     Past Surgical History:  Procedure Laterality Date  . CESAREAN SECTION    . ivp    . TUBAL LIGATION      Family History  Problem Relation Age of Onset  . Emphysema  Mother   . Hypertension Father   . Heart disease Brother 2850       mi   Social History:  reports that  has never smoked. she has never used smokeless tobacco. She reports that she does not drink alcohol or use drugs.  Allergies:  Allergies  Allergen Reactions  . Fluoxetine Other (See Comments)    Made her tremor worse   . Lexapro [Escitalopram Oxalate] Other (See Comments)    Headache  . Morphine Nausea And Vomiting  . Sertraline Other (See Comments)    Headache      (Not in a hospital admission)  Results for orders placed or performed in visit on 05/15/17 (from the past 48 hour(s))  CBC with Differential/Platelet     Status: None   Collection Time: 05/15/17 11:46 AM  Result Value Ref Range   WBC 7.5 3.8 - 10.8 Thousand/uL   RBC 4.36 3.80 - 5.10 Million/uL   Hemoglobin 13.3 11.7 - 15.5 g/dL   HCT 40.939.1 81.135.0 - 91.445.0 %   MCV 89.7 80.0 - 100.0 fL   MCH 30.5 27.0 - 33.0 pg   MCHC 34.0 32.0 - 36.0 g/dL   RDW 78.212.8 95.611.0 - 21.315.0 %   Platelets 317 140 - 400 Thousand/uL   MPV 9.6 7.5 - 12.5 fL   Neutro Abs 4,463 1,500 - 7,800 cells/uL   Lymphs Abs 2,385 850 - 3,900 cells/uL   WBC mixed population 503 200 -  950 cells/uL   Eosinophils Absolute 120 15 - 500 cells/uL   Basophils Absolute 30 0 - 200 cells/uL   Neutrophils Relative % 59.5 %   Total Lymphocyte 31.8 %   Monocytes Relative 6.7 %   Eosinophils Relative 1.6 %   Basophils Relative 0.4 %  Comprehensive metabolic panel     Status: None   Collection Time: 05/15/17 11:46 AM  Result Value Ref Range   Glucose, Bld 82 65 - 99 mg/dL    Comment: .            Fasting reference interval .    BUN 23 7 - 25 mg/dL   Creat 1.61 0.96 - 0.45 mg/dL    Comment: For patients >75 years of age, the reference limit for Creatinine is approximately 13% higher for people identified as African-American. .    BUN/Creatinine Ratio NOT APPLICABLE 6 - 22 (calc)   Sodium 137 135 - 146 mmol/L   Potassium 4.3 3.5 - 5.3 mmol/L   Chloride 109 98  - 110 mmol/L   CO2 21 20 - 32 mmol/L   Calcium 9.3 8.6 - 10.4 mg/dL   Total Protein 6.7 6.1 - 8.1 g/dL   Albumin 4.0 3.6 - 5.1 g/dL   Globulin 2.7 1.9 - 3.7 g/dL (calc)   AG Ratio 1.5 1.0 - 2.5 (calc)   Total Bilirubin 0.3 0.2 - 1.2 mg/dL   Alkaline phosphatase (APISO) 56 33 - 130 U/L   AST 18 10 - 35 U/L   ALT 25 6 - 29 U/L  Amylase     Status: None   Collection Time: 05/15/17 11:46 AM  Result Value Ref Range   Amylase 87 21 - 101 U/L  Lipase     Status: None   Collection Time: 05/15/17 11:46 AM  Result Value Ref Range   Lipase 13 7 - 60 U/L  Urinalysis     Status: Abnormal   Collection Time: 05/15/17 11:46 AM  Result Value Ref Range   Color, Urine YELLOW YELLOW   APPearance CLOUDY (A) CLEAR   Specific Gravity, Urine 1.028 1.001 - 1.03   pH < OR = 5.0 5.0 - 8.0   Glucose, UA NEGATIVE NEGATIVE   Bilirubin Urine NEGATIVE NEGATIVE   Ketones, ur NEGATIVE NEGATIVE   Hgb urine dipstick 2+ (A) NEGATIVE   Protein, ur NEGATIVE NEGATIVE   Nitrite NEGATIVE NEGATIVE   Leukocytes, UA NEGATIVE NEGATIVE   Ct Abdomen Pelvis W Contrast  Result Date: 05/15/2017 CLINICAL DATA:  Acute right lower quadrant abdominal pain. EXAM: CT ABDOMEN AND PELVIS WITH CONTRAST TECHNIQUE: Multidetector CT imaging of the abdomen and pelvis was performed using the standard protocol following bolus administration of intravenous contrast. CONTRAST:  ISOVUE-300 IOPAMIDOL (ISOVUE-300) INJECTION 61% COMPARISON:  None. FINDINGS: Lower chest: No acute abnormality. Hepatobiliary: No focal liver abnormality is seen. No gallstones, gallbladder wall thickening, or biliary dilatation. Pancreas: Unremarkable. No pancreatic ductal dilatation or surrounding inflammatory changes. Spleen: Normal in size without focal abnormality. Adrenals/Urinary Tract: Adrenal glands are unremarkable. Kidneys are normal, without renal calculi, focal lesion, or hydronephrosis. Bladder is unremarkable. Stomach/Bowel: The stomach appears normal.  There is no evidence of bowel obstruction. The appendix is thick-walled and enlarged at 9 mm, although no significant surrounding inflammation is noted. It is retrocecal in position. Vascular/Lymphatic: No significant vascular findings are present. No enlarged abdominal or pelvic lymph nodes. Reproductive: Uterus and bilateral adnexa are unremarkable. Other: No abdominal wall hernia or abnormality. No abdominopelvic ascites. Musculoskeletal: No acute  or significant osseous findings. IMPRESSION: The appendix is thick-walled and enlarged at 9 mm in diameter, although no surrounding inflammation is noted. These findings are equivocal for acute appendicitis, and correlation with laboratory and clinical findings is recommended. Electronically Signed   By: Lupita Raider, M.D.   On: 05/15/2017 14:22    Review of Systems  Constitutional: Negative for chills, diaphoresis and fever.  HENT: Negative.   Eyes: Negative.   Respiratory: Negative.   Cardiovascular: Negative.   Gastrointestinal: Positive for abdominal pain, diarrhea and nausea. Negative for blood in stool, constipation, melena and vomiting.  Genitourinary: Negative.   Musculoskeletal: Negative.   Skin: Negative.   Neurological: Negative.   Endo/Heme/Allergies: Negative.   Psychiatric/Behavioral: Negative.     Blood pressure 135/83, pulse 68, temperature 98.4 F (36.9 C), temperature source Oral, resp. rate 16, SpO2 100 %. Physical Exam  Vitals reviewed. Constitutional: She is oriented to person, place, and time. She appears well-developed and well-nourished. No distress.  HENT:  Head: Normocephalic and atraumatic.  Right Ear: External ear normal.  Left Ear: External ear normal.  Mouth/Throat: No oropharyngeal exudate.  Eyes: Conjunctivae are normal. Pupils are equal, round, and reactive to light. Right eye exhibits no discharge. Left eye exhibits no discharge. No scleral icterus.  Neck: Normal range of motion. Neck supple. No JVD  present. No tracheal deviation present. No thyromegaly present.  Cardiovascular: Normal rate, regular rhythm and normal heart sounds.  No murmur heard. Respiratory: Effort normal and breath sounds normal. No respiratory distress. She has no wheezes.  GI: Soft. Bowel sounds are normal. She exhibits no distension and no mass. There is tenderness (mild RLQ tenderness). There is no rebound and no guarding.  Musculoskeletal: Normal range of motion. She exhibits no edema, tenderness or deformity.  Lymphadenopathy:    She has no cervical adenopathy.  Neurological: She is alert and oriented to person, place, and time.  Skin: Skin is warm and dry. She is not diaphoretic.  Psychiatric: She has a normal mood and affect. Her behavior is normal.     Assessment/Plan Right lower quadrant abdominal pain - rule out appendicitis versus gastroenteritis  Admit for observation to general surgery service  IV hydration, clear liquid diet overnight  Repeat CBC, diff in AM 2/20  Repeat abdominal exam in AM 2/20  Rx pain, nausea if needed  I discussed the CT scan findings and laboratory studies with the patient and her husband at the bedside in the emergency department.  Patient does not appear ill.  Physical exam shows very mild right lower quadrant tenderness.  Patient states that this area is tender whenever she has crampy abdominal pain or a "gas bubble" and that this has occurred for a long time.  I have recommended overnight observation with intravenous hydration and repeat laboratory studies in the morning with repeat physical examination.  If her examination is clinically worse and her white blood cell count is elevated or her differential shows a shift to the left, and I would think we will consider laparoscopic appendectomy.  I have discussed the procedure with the patient and her husband.  They understand.  They agree with the above plan.  Darnell Level, MD [facility] Office:  [phone]    Velora Heckler, MD 05/15/2017, 8:31 PM

## 2017-05-15 NOTE — ED Triage Notes (Addendum)
[facility] with Dr. Gerrit FriendsGerkin.

## 2017-05-15 NOTE — ED Notes (Signed)
ED TO INPATIENT HANDOFF REPORT  Name/Age/Gender Carol Diaz 52 y.o. female  Code Status    Code Status Orders  (From admission, onward)        Start     Ordered   05/15/17 2042  Full code  Continuous     05/15/17 2043    Code Status History    Date Active Date Inactive Code Status Order ID Comments User Context   This patient has a current code status but no historical code status.      Home/SNF/Other Home  Chief Complaint Dr. Rhina Brackett to be Admitted  Level of Care/Admitting Diagnosis ED Disposition    ED Disposition Condition Comment   Admit  Hospital Area: [facility] [[id number]]  Level of Care: Med-Surg [16]  Diagnosis: Abdominal pain [161096]  Admitting Physician: CCS, MD [[id number]]  Attending Physician: CCS, MD [[id number]]  PT Class (Do Not Modify): Observation [104]  PT Acc Code (Do Not Modify): Observation [10022]       Medical History Past Medical History:  Diagnosis Date  . Chronic fatigue syndrome   . Hypertension   . Migraines   . Palpitations     Allergies Allergies  Allergen Reactions  . Fluoxetine Other (See Comments)    Made her tremor worse   . Lexapro [Escitalopram Oxalate] Other (See Comments)    Headache  . Morphine Nausea And Vomiting  . Sertraline Other (See Comments)    Headache     IV Location/Drains/Wounds Patient Lines/Drains/Airways Status   Active Line/Drains/Airways    Name:   Placement date:   Placement time:   Site:   Days:   Peripheral IV 05/15/17 Left Antecubital   05/15/17    2140    Antecubital   less than 1          Labs/Imaging Results for orders placed or performed in visit on 05/15/17 (from the past 48 hour(s))  CBC with Differential/Platelet     Status: None   Collection Time: 05/15/17 11:46 AM  Result Value Ref Range   WBC 7.5 3.8 - 10.8 Thousand/uL   RBC 4.36 3.80 - 5.10 Million/uL   Hemoglobin 13.3 11.7 - 15.5 g/dL   HCT 04.5 40.9 - 81.1 %   MCV 89.7 80.0 - 100.0 fL   MCH 30.5 27.0  - 33.0 pg   MCHC 34.0 32.0 - 36.0 g/dL   RDW 91.4 78.2 - 95.6 %   Platelets 317 140 - 400 Thousand/uL   MPV 9.6 7.5 - 12.5 fL   Neutro Abs 4,463 1,500 - 7,800 cells/uL   Lymphs Abs 2,385 850 - 3,900 cells/uL   WBC mixed population 503 200 - 950 cells/uL   Eosinophils Absolute 120 15 - 500 cells/uL   Basophils Absolute 30 0 - 200 cells/uL   Neutrophils Relative % 59.5 %   Total Lymphocyte 31.8 %   Monocytes Relative 6.7 %   Eosinophils Relative 1.6 %   Basophils Relative 0.4 %  Comprehensive metabolic panel     Status: None   Collection Time: 05/15/17 11:46 AM  Result Value Ref Range   Glucose, Bld 82 65 - 99 mg/dL    Comment: .            Fasting reference interval .    BUN 23 7 - 25 mg/dL   Creat 2.13 0.86 - 5.78 mg/dL    Comment: For patients >34 years of age, the reference limit for Creatinine is approximately 13% higher for people identified as  African-American. .    BUN/Creatinine Ratio NOT APPLICABLE 6 - 22 (calc)   Sodium 137 135 - 146 mmol/L   Potassium 4.3 3.5 - 5.3 mmol/L   Chloride 109 98 - 110 mmol/L   CO2 21 20 - 32 mmol/L   Calcium 9.3 8.6 - 10.4 mg/dL   Total Protein 6.7 6.1 - 8.1 g/dL   Albumin 4.0 3.6 - 5.1 g/dL   Globulin 2.7 1.9 - 3.7 g/dL (calc)   AG Ratio 1.5 1.0 - 2.5 (calc)   Total Bilirubin 0.3 0.2 - 1.2 mg/dL   Alkaline phosphatase (APISO) 56 33 - 130 U/L   AST 18 10 - 35 U/L   Diaz 25 6 - 29 U/L  Amylase     Status: None   Collection Time: 05/15/17 11:46 AM  Result Value Ref Range   Amylase 87 21 - 101 U/L  Lipase     Status: None   Collection Time: 05/15/17 11:46 AM  Result Value Ref Range   Lipase 13 7 - 60 U/L  Urinalysis     Status: Abnormal   Collection Time: 05/15/17 11:46 AM  Result Value Ref Range   Color, Urine YELLOW YELLOW   APPearance CLOUDY (A) CLEAR   Specific Gravity, Urine 1.028 1.001 - 1.03   pH < OR = 5.0 5.0 - 8.0   Glucose, UA NEGATIVE NEGATIVE   Bilirubin Urine NEGATIVE NEGATIVE   Ketones, ur NEGATIVE NEGATIVE    Hgb urine dipstick 2+ (A) NEGATIVE   Protein, ur NEGATIVE NEGATIVE   Nitrite NEGATIVE NEGATIVE   Leukocytes, UA NEGATIVE NEGATIVE   Ct Abdomen Pelvis W Contrast  Result Date: 05/15/2017 CLINICAL DATA:  Acute right lower quadrant abdominal pain. EXAM: CT ABDOMEN AND PELVIS WITH CONTRAST TECHNIQUE: Multidetector CT imaging of the abdomen and pelvis was performed using the standard protocol following bolus administration of intravenous contrast. CONTRAST:  ISOVUE-300 IOPAMIDOL (ISOVUE-300) INJECTION 61% COMPARISON:  None. FINDINGS: Lower chest: No acute abnormality. Hepatobiliary: No focal liver abnormality is seen. No gallstones, gallbladder wall thickening, or biliary dilatation. Pancreas: Unremarkable. No pancreatic ductal dilatation or surrounding inflammatory changes. Spleen: Normal in size without focal abnormality. Adrenals/Urinary Tract: Adrenal glands are unremarkable. Kidneys are normal, without renal calculi, focal lesion, or hydronephrosis. Bladder is unremarkable. Stomach/Bowel: The stomach appears normal. There is no evidence of bowel obstruction. The appendix is thick-walled and enlarged at 9 mm, although no significant surrounding inflammation is noted. It is retrocecal in position. Vascular/Lymphatic: No significant vascular findings are present. No enlarged abdominal or pelvic lymph nodes. Reproductive: Uterus and bilateral adnexa are unremarkable. Other: No abdominal wall hernia or abnormality. No abdominopelvic ascites. Musculoskeletal: No acute or significant osseous findings. IMPRESSION: The appendix is thick-walled and enlarged at 9 mm in diameter, although no surrounding inflammation is noted. These findings are equivocal for acute appendicitis, and correlation with laboratory and clinical findings is recommended. Electronically Signed   By: Lupita Raider, M.D.   On: 05/15/2017 14:22    Pending Labs Unresulted Labs (From admission, onward)   Start     Ordered   05/16/17  0500  CBC with Differential/Platelet  Tomorrow morning,   R     05/15/17 2044      Vitals/Pain Today's Vitals   05/15/17 1932 05/15/17 2037 05/15/17 2122 05/15/17 2138  BP: 135/83 127/81  127/81  Pulse: 68 69  69  Resp: 16 20  15   Temp: 98.4 F (36.9 C) 98.3 F (36.8 C)    TempSrc: Oral  Oral    SpO2: 100% 100%  100%  PainSc: 4   8  7      Isolation Precautions No active isolations  Medications Medications  dextrose 5 % and 0.45 % NaCl with KCl 20 mEq/L infusion ( Intravenous New Bag/Given 05/15/17 2139)  acetaminophen (TYLENOL) tablet 650 mg (not administered)    Or  acetaminophen (TYLENOL) suppository 650 mg (not administered)  traMADol (ULTRAM) tablet 50 mg (not administered)  ondansetron (ZOFRAN-ODT) disintegrating tablet 4 mg (not administered)    Or  ondansetron (ZOFRAN) injection 4 mg (not administered)  pantoprazole (PROTONIX) injection 40 mg (40 mg Intravenous Given 05/15/17 2152)  HYDROmorphone (DILAUDID) injection 1-2 mg (1 mg Intravenous Given 05/15/17 2152)    Mobility walks

## 2017-05-16 LAB — CBC WITH DIFFERENTIAL/PLATELET
BASOS PCT: 0 %
Basophils Absolute: 0 10*3/uL (ref 0.0–0.1)
EOS ABS: 0.2 10*3/uL (ref 0.0–0.7)
EOS PCT: 2 %
HCT: 35.9 % — ABNORMAL LOW (ref 36.0–46.0)
HEMOGLOBIN: 11.8 g/dL — AB (ref 12.0–15.0)
Lymphocytes Relative: 43 %
Lymphs Abs: 2.8 10*3/uL (ref 0.7–4.0)
MCH: 30.3 pg (ref 26.0–34.0)
MCHC: 32.9 g/dL (ref 30.0–36.0)
MCV: 92.1 fL (ref 78.0–100.0)
MONOS PCT: 9 %
Monocytes Absolute: 0.6 10*3/uL (ref 0.1–1.0)
NEUTROS PCT: 46 %
Neutro Abs: 3 10*3/uL (ref 1.7–7.7)
PLATELETS: 245 10*3/uL (ref 150–400)
RBC: 3.9 MIL/uL (ref 3.87–5.11)
RDW: 13.4 % (ref 11.5–15.5)
WBC: 6.6 10*3/uL (ref 4.0–10.5)

## 2017-05-16 LAB — URINE CULTURE
MICRO NUMBER:: 90218038
Result:: NO GROWTH
SPECIMEN QUALITY:: ADEQUATE

## 2017-05-16 MED ORDER — CEFOTETAN DISODIUM-DEXTROSE 2-2.08 GM-%(50ML) IV SOLR
2.0000 g | INTRAVENOUS | Status: AC
  Administered 2017-05-17: 2 g via INTRAVENOUS
  Filled 2017-05-16: qty 2
  Filled 2017-05-16: qty 50

## 2017-05-16 MED ORDER — SODIUM CHLORIDE 0.9 % IV SOLN
1.0000 g | INTRAVENOUS | Status: DC
  Filled 2017-05-16: qty 1

## 2017-05-16 NOTE — Progress Notes (Signed)
RLQ pain  Subjective: 52 y.o. F with RLQ pain x5 days as well as diarrhea.  Pain about the same Objective: Vital signs in last 24 hours: Temp:  [97.7 F (36.5 C)-98.4 F (36.9 C)] 98.1 F (36.7 C) (02/20 0800) Pulse Rate:  [62-74] 74 (02/20 0800) Resp:  [15-20] 18 (02/20 0800) BP: (107-135)/(67-83) 107/67 (02/20 0800) SpO2:  [99 %-100 %] 100 % (02/20 0800) Weight:  [67.6 kg (149 lb)-67.7 kg (149 lb 4 oz)] 67.7 kg (149 lb 4 oz) (02/19 2236) Last BM Date: 05/14/17  Intake/Output from previous day: 02/19 0701 - 02/20 0700 In: 1161.7 [P.O.:240; I.V.:921.7] Out: 0  Intake/Output this shift: No intake/output data recorded.  General appearance: alert and cooperative GI: soft, mild tenderness in LLQ, RLQ and RUQ  Lab Results:  Results for orders placed or performed during the hospital encounter of 05/15/17 (from the past 24 hour(s))  CBC with Differential/Platelet     Status: Abnormal   Collection Time: 05/16/17  5:07 AM  Result Value Ref Range   WBC 6.6 4.0 - 10.5 K/uL   RBC 3.90 3.87 - 5.11 MIL/uL   Hemoglobin 11.8 (L) 12.0 - 15.0 g/dL   HCT 25.335.9 (L) 66.436.0 - 40.346.0 %   MCV 92.1 78.0 - 100.0 fL   MCH 30.3 26.0 - 34.0 pg   MCHC 32.9 30.0 - 36.0 g/dL   RDW 47.413.4 25.911.5 - 56.315.5 %   Platelets 245 150 - 400 K/uL   Neutrophils Relative % 46 %   Neutro Abs 3.0 1.7 - 7.7 K/uL   Lymphocytes Relative 43 %   Lymphs Abs 2.8 0.7 - 4.0 K/uL   Monocytes Relative 9 %   Monocytes Absolute 0.6 0.1 - 1.0 K/uL   Eosinophils Relative 2 %   Eosinophils Absolute 0.2 0.0 - 0.7 K/uL   Basophils Relative 0 %   Basophils Absolute 0.0 0.0 - 0.1 K/uL     Studies/Results Radiology     MEDS, Scheduled . pantoprazole (PROTONIX) IV  40 mg Intravenous QHS     Assessment: RLQ pain  Plan: Repeat labwork shows no leukocytosis or left shift.  CT shows mildly thickened appendical wall with no surrounding inflammation.  History is no consistent with appendicitis either.  We will let her eat today and  recheck her this afternoon.  As long as pain is not worse, we will discharge to home and have her f/u with GI for colonoscopy.  If pain is worse this afternoon, will plan on diagnostic laparoscopy tom.      LOS: 0 days    Vanita PandaAlicia C Yue Flanigan, MD [facility], [place] [phone]   05/16/2017 10:03 AM

## 2017-05-16 NOTE — Progress Notes (Signed)
    WG:[id number]:abdominal pain, diarrhea, cramps  Subjective: Still complains of RLQ pain but not tender on palpation, abdomen is soft, + BS, no distension.  No diarrhea now.  Objective: Vital signs in last 24 hours: Temp:  [97.7 F (36.5 C)-98.4 F (36.9 C)] 97.9 F (36.6 C) (02/20 0600) Pulse Rate:  [62-71] 71 (02/20 0600) Resp:  [15-20] 20 (02/20 0600) BP: (118-135)/(71-83) 121/71 (02/20 0600) SpO2:  [99 %-100 %] 99 % (02/20 0600) Weight:  [67.6 kg (149 lb)-67.7 kg (149 lb 4 oz)] 67.7 kg (149 lb 4 oz) (02/19 2236) Last BM Date: 05/14/17 240 PO 921 IV Voided x 1 No BM Afebrile, VSS WBC this AM 6.6  Intake/Output from previous day: 02/19 0701 - 02/20 0700 In: 1161.7 [P.O.:240; I.V.:921.7] Out: 0  Intake/Output this shift: No intake/output data recorded.  General appearance: alert, cooperative and no distress Resp: clear to auscultation bilaterally GI: soft, non-tender; bowel sounds normal; no masses,  no organomegaly  Lab Results:  Recent Labs    05/15/17 1146 05/16/17 0507  WBC 7.5 6.6  HGB 13.3 11.8*  HCT 39.1 35.9*  PLT 317 245    BMET Recent Labs    05/15/17 1146  NA 137  K 4.3  CL 109  CO2 21  GLUCOSE 82  BUN 23  CREATININE 1.02  CALCIUM 9.3   PT/INR No results for input(s): LABPROT, INR in the last 72 hours.  Recent Labs  Lab 05/15/17 1146  AST 18  ALT 25  BILITOT 0.3  PROT 6.7     Lipase     Component Value Date/Time   LIPASE 13 05/15/2017 1146     Medications: . pantoprazole (PROTONIX) IV  40 mg Intravenous QHS   . dextrose 5 % and 0.45 % NaCl with KCl 20 mEq/L 100 mL/hr at 05/16/17 [id number]   Anti-infectives (From admission, onward)   None      Assessment/Plan abdominal pain, cramps, diarrhea Gastroenteritis vs appendicitis  Chronic fatigue syndrome Migraines Hypertension  FEN: IV fluids/clears ID:  None DVT:  SCD Foley:  None Follow up:  TBD   Plan:  Try her on a diet and see how she does.    LOS: 0 days     Thelma Lorenzetti 05/16/2017 [phone]

## 2017-05-17 ENCOUNTER — Encounter (HOSPITAL_COMMUNITY)
Admission: EM | Disposition: A | Discharge status: Home / Self Care | Payer: Self-pay | Source: Home / Self Care | Attending: Emergency Medicine

## 2017-05-17 ENCOUNTER — Observation Stay (HOSPITAL_COMMUNITY): Payer: BC Managed Care – PPO | Admitting: Certified Registered Nurse Anesthetist

## 2017-05-17 HISTORY — PX: LAPAROSCOPIC APPENDECTOMY: SHX408

## 2017-05-17 LAB — CBC
HCT: 37 % (ref 36.0–46.0)
Hemoglobin: 12.1 g/dL (ref 12.0–15.0)
MCH: 30.8 pg (ref 26.0–34.0)
MCHC: 32.7 g/dL (ref 30.0–36.0)
MCV: 94.1 fL (ref 78.0–100.0)
PLATELETS: 267 10*3/uL (ref 150–400)
RBC: 3.93 MIL/uL (ref 3.87–5.11)
RDW: 13.4 % (ref 11.5–15.5)
WBC: 6.1 10*3/uL (ref 4.0–10.5)

## 2017-05-17 LAB — SURGICAL PCR SCREEN
MRSA, PCR: NEGATIVE
Staphylococcus aureus: POSITIVE — AB

## 2017-05-17 SURGERY — APPENDECTOMY, LAPAROSCOPIC
Anesthesia: General | Site: Abdomen

## 2017-05-17 MED ORDER — FENTANYL CITRATE (PF) 100 MCG/2ML IJ SOLN
INTRAMUSCULAR | Status: AC
  Administered 2017-05-17: 50 ug via INTRAVENOUS
  Filled 2017-05-17: qty 2

## 2017-05-17 MED ORDER — MIDAZOLAM HCL 2 MG/2ML IJ SOLN
INTRAMUSCULAR | Status: AC
  Filled 2017-05-17: qty 2

## 2017-05-17 MED ORDER — DEXAMETHASONE SODIUM PHOSPHATE 10 MG/ML IJ SOLN
INTRAMUSCULAR | Status: AC
  Filled 2017-05-17: qty 1

## 2017-05-17 MED ORDER — LIDOCAINE 2% (20 MG/ML) 5 ML SYRINGE
INTRAMUSCULAR | Status: DC | PRN
  Administered 2017-05-17: 60 mg via INTRAVENOUS

## 2017-05-17 MED ORDER — TRAMADOL HCL 50 MG PO TABS: 50.0000 mg | ORAL_TABLET | Freq: Four times a day (QID) | ORAL | 0 refills | Status: DC | PRN

## 2017-05-17 MED ORDER — PROPOFOL 10 MG/ML IV BOLUS
INTRAVENOUS | Status: AC
  Filled 2017-05-17: qty 20

## 2017-05-17 MED ORDER — DEXAMETHASONE SODIUM PHOSPHATE 10 MG/ML IJ SOLN
INTRAMUSCULAR | Status: DC | PRN
  Administered 2017-05-17: 10 mg via INTRAVENOUS

## 2017-05-17 MED ORDER — SUGAMMADEX SODIUM 500 MG/5ML IV SOLN
INTRAVENOUS | Status: DC | PRN
  Administered 2017-05-17: 270 mg via INTRAVENOUS

## 2017-05-17 MED ORDER — MUPIROCIN 2 % EX OINT
1.0000 "application " | TOPICAL_OINTMENT | Freq: Two times a day (BID) | CUTANEOUS | Status: DC
  Administered 2017-05-17: 1 via NASAL
  Filled 2017-05-17: qty 22

## 2017-05-17 MED ORDER — PROPOFOL 10 MG/ML IV BOLUS
INTRAVENOUS | Status: DC | PRN
  Administered 2017-05-17: 120 mg via INTRAVENOUS

## 2017-05-17 MED ORDER — ACETAMINOPHEN 160 MG/5ML PO SOLN
960.0000 mg | Freq: Once | ORAL | Status: DC
Start: 2017-05-17 — End: 2017-05-17

## 2017-05-17 MED ORDER — ROCURONIUM BROMIDE 10 MG/ML (PF) SYRINGE
PREFILLED_SYRINGE | INTRAVENOUS | Status: DC | PRN
  Administered 2017-05-17: 50 mg via INTRAVENOUS

## 2017-05-17 MED ORDER — CHLORHEXIDINE GLUCONATE CLOTH 2 % EX PADS
6.0000 | MEDICATED_PAD | Freq: Every day | CUTANEOUS | Status: DC
  Administered 2017-05-17: 6 via TOPICAL

## 2017-05-17 MED ORDER — LIDOCAINE 2% (20 MG/ML) 5 ML SYRINGE
INTRAMUSCULAR | Status: AC
  Filled 2017-05-17: qty 5

## 2017-05-17 MED ORDER — RINGERS IRRIGATION IR SOLN
Status: DC | PRN
  Administered 2017-05-17: 2000 mL

## 2017-05-17 MED ORDER — ONDANSETRON HCL 4 MG/2ML IJ SOLN
INTRAMUSCULAR | Status: DC | PRN
  Administered 2017-05-17: 4 mg via INTRAVENOUS

## 2017-05-17 MED ORDER — IBUPROFEN 200 MG PO TABS: 400.0000 mg | ORAL_TABLET | ORAL | Status: DC | PRN

## 2017-05-17 MED ORDER — ACETAMINOPHEN 325 MG PO TABS: ORAL_TABLET | ORAL | Status: DC

## 2017-05-17 MED ORDER — BUPIVACAINE LIPOSOME 1.3 % IJ SUSP
20.0000 mL | Freq: Once | INTRAMUSCULAR | Status: DC
Start: 2017-05-17 — End: 2017-05-17
  Filled 2017-05-17: qty 20

## 2017-05-17 MED ORDER — FENTANYL CITRATE (PF) 100 MCG/2ML IJ SOLN
INTRAMUSCULAR | Status: DC | PRN
  Administered 2017-05-17 (×3): 50 ug via INTRAVENOUS

## 2017-05-17 MED ORDER — LACTATED RINGERS IV SOLN
INTRAVENOUS | Status: DC
Start: 2017-05-17 — End: 2017-05-17
  Administered 2017-05-17: 12:00:00 via INTRAVENOUS

## 2017-05-17 MED ORDER — BUPIVACAINE-EPINEPHRINE 0.25% -1:200000 IJ SOLN
INTRAMUSCULAR | Status: DC | PRN
  Administered 2017-05-17: 25 mL

## 2017-05-17 MED ORDER — ROCURONIUM BROMIDE 10 MG/ML (PF) SYRINGE
PREFILLED_SYRINGE | INTRAVENOUS | Status: AC
  Filled 2017-05-17: qty 5

## 2017-05-17 MED ORDER — FENTANYL CITRATE (PF) 100 MCG/2ML IJ SOLN
25.0000 ug | INTRAMUSCULAR | Status: DC | PRN
  Administered 2017-05-17 (×3): 50 ug via INTRAVENOUS

## 2017-05-17 MED ORDER — ONDANSETRON HCL 4 MG/2ML IJ SOLN
INTRAMUSCULAR | Status: AC
  Filled 2017-05-17: qty 2

## 2017-05-17 MED ORDER — FENTANYL CITRATE (PF) 250 MCG/5ML IJ SOLN
INTRAMUSCULAR | Status: AC
  Filled 2017-05-17: qty 5

## 2017-05-17 MED ORDER — BUPIVACAINE-EPINEPHRINE 0.25% -1:200000 IJ SOLN
INTRAMUSCULAR | Status: AC
  Filled 2017-05-17: qty 1

## 2017-05-17 MED ORDER — SODIUM CHLORIDE 0.9 % IR SOLN
Status: DC | PRN
  Administered 2017-05-17: 1000 mL

## 2017-05-17 MED ORDER — MIDAZOLAM HCL 5 MG/5ML IJ SOLN
INTRAMUSCULAR | Status: DC | PRN
  Administered 2017-05-17: 2 mg via INTRAVENOUS

## 2017-05-17 SURGICAL SUPPLY — 43 items
ADH SKN CLS APL DERMABOND .7 (GAUZE/BANDAGES/DRESSINGS) ×1
APPLIER CLIP 5 13 M/L LIGAMAX5 (MISCELLANEOUS)
APPLIER CLIP ROT 10 11.4 M/L (STAPLE)
APR CLP MED LRG 11.4X10 (STAPLE)
APR CLP MED LRG 5 ANG JAW (MISCELLANEOUS)
BAG SPEC RTRVL LRG 6X4 10 (ENDOMECHANICALS)
CABLE HIGH FREQUENCY MONO STRZ (ELECTRODE) ×3 IMPLANT
CHLORAPREP W/TINT 26ML (MISCELLANEOUS) ×3 IMPLANT
CLIP APPLIE 5 13 M/L LIGAMAX5 (MISCELLANEOUS) IMPLANT
CLIP APPLIE ROT 10 11.4 M/L (STAPLE) IMPLANT
DECANTER SPIKE VIAL GLASS SM (MISCELLANEOUS) ×3 IMPLANT
DERMABOND ADVANCED (GAUZE/BANDAGES/DRESSINGS) ×2
DERMABOND ADVANCED .7 DNX12 (GAUZE/BANDAGES/DRESSINGS) ×1 IMPLANT
DRAPE LAPAROSCOPIC ABDOMINAL (DRAPES) IMPLANT
ELECT REM PT RETURN 15FT ADLT (MISCELLANEOUS) ×3 IMPLANT
GLOVE BIO SURGEON STRL SZ 6.5 (GLOVE) ×2 IMPLANT
GLOVE BIO SURGEONS STRL SZ 6.5 (GLOVE) ×1
GLOVE BIOGEL PI IND STRL 7.0 (GLOVE) ×1 IMPLANT
GLOVE BIOGEL PI INDICATOR 7.0 (GLOVE) ×2
GOWN STRL REUS W/TWL 2XL LVL3 (GOWN DISPOSABLE) ×3 IMPLANT
GOWN STRL REUS W/TWL XL LVL3 (GOWN DISPOSABLE) ×3 IMPLANT
GRASPER SUT TROCAR 14GX15 (MISCELLANEOUS) IMPLANT
HANDLE STAPLE EGIA 4 XL (STAPLE) ×3 IMPLANT
IRRIG SUCT STRYKERFLOW 2 WTIP (MISCELLANEOUS) ×3
IRRIGATION SUCT STRKRFLW 2 WTP (MISCELLANEOUS) ×1 IMPLANT
KIT BASIN OR (CUSTOM PROCEDURE TRAY) ×3 IMPLANT
MARKER SKIN DUAL TIP RULER LAB (MISCELLANEOUS) IMPLANT
POUCH SPECIMEN RETRIEVAL 10MM (ENDOMECHANICALS) IMPLANT
RELOAD EGIA 45 MED/THCK PURPLE (STAPLE) IMPLANT
RELOAD EGIA 45 TAN VASC (STAPLE) IMPLANT
RELOAD EGIA 60 MED/THCK PURPLE (STAPLE) ×3 IMPLANT
RELOAD EGIA 60 TAN VASC (STAPLE) ×3 IMPLANT
SCISSORS LAP 5X35 DISP (ENDOMECHANICALS) IMPLANT
SLEEVE XCEL OPT CAN 5 100 (ENDOMECHANICALS) IMPLANT
SUT VIC AB 2-0 SH 27 (SUTURE)
SUT VIC AB 2-0 SH 27X BRD (SUTURE) IMPLANT
SUT VIC AB 4-0 PS2 27 (SUTURE) ×3 IMPLANT
SUT VICRYL 0 UR6 27IN ABS (SUTURE) IMPLANT
TOWEL OR 17X26 10 PK STRL BLUE (TOWEL DISPOSABLE) ×3 IMPLANT
TRAY FOLEY W/METER SILVER 16FR (SET/KITS/TRAYS/PACK) ×3 IMPLANT
TRAY LAPAROSCOPIC (CUSTOM PROCEDURE TRAY) ×3 IMPLANT
TROCAR BLADELESS OPT 5 100 (ENDOMECHANICALS) IMPLANT
TROCAR XCEL BLUNT TIP 100MML (ENDOMECHANICALS) ×3 IMPLANT

## 2017-05-17 NOTE — Progress Notes (Signed)
Discharge and medication instructions reviewed with patient and spouse. Questions answered and both deny further questions. One prescription and a work note given to patient. Spouse will drive patient home. Lina SarBeth Kijuan Gallicchio, RN

## 2017-05-17 NOTE — Op Note (Signed)
GWYNNE KEMNITZ [id number]   PRE-OPERATIVE DIAGNOSIS:  RIGHT LOWER QUAD PAIN   POST-OPERATIVE DIAGNOSIS:  RIGHT LOWER QUAD PAIN, DILATED APPENDICEAL BASE   Procedure(s): DIAGNOSTIC LAPAROSCOPY,  APPENDECTOMY   Surgeon(s): Romie Levee, MD  ASSISTANT: none   ANESTHESIA:   local and general  EBL:   30ml  Delay start of Pharmacological VTE agent (>24hrs) due to surgical blood loss or risk of bleeding:  no  DRAINS: none   SPECIMEN:  Source of Specimen:  appendix  DISPOSITION OF SPECIMEN:  PATHOLOGY  COUNTS:  YES  PLAN OF CARE: Pt admitted  PATIENT DISPOSITION:  PACU - hemodynamically stable.   INDICATIONS: Patient with concerning symptoms & work up suspicious for appendicitis.  Surgery was recommended:  The anatomy & physiology of the digestive tract was discussed.  The pathophysiology of appendicitis was discussed.  Natural history risks without surgery was discussed.   I feel the risks of no intervention will lead to serious problems that outweigh the operative risks; therefore, I recommended diagnostic laparoscopy with removal of appendix to remove the pathology.  Laparoscopic & open techniques were discussed.   I noted a good likelihood this will help address the problem.    Risks such as bleeding, infection, abscess, leak, reoperation, possible ostomy, hernia, heart attack, death, and other risks were discussed.  Goals of post-operative recovery were discussed as well.  We will work to minimize complications.  Questions were answered.  The patient expresses understanding & wishes to proceed with surgery.  OR FINDINGS: normal appearing appendix with dilated base, abdominal wall and pelvic adhesions  DESCRIPTION:   The patient was identified & brought into the operating room. The patient was positioned supine with left arm tucked. SCDs were active during the entire case. The patient underwent general anesthesia without any difficulty.  A foley catheter was inserted under  sterile conditions. The abdomen was prepped and draped in a sterile fashion. A Surgical Timeout confirmed our plan.   I made a transverse incision through the inferior umbilical fold.  I made a nick in the infraumbilical fascia and confirmed peritoneal entry.  I placed a stay suture and then the [facility] port.  We induced carbon dioxide insufflation.  Camera inspection revealed no injury.  I placed additional ports under direct laparoscopic visualization.  I then bluntly took down the omentum that was adherent to the abdominal wall from the previous surgery.  I mobilized the terminal ileum to proximal ascending colon in a lateral to medial fashion.  I took care to avoid injuring any retroperitoneal structures.   I freed the appendix off its attachments to the ascending colon and cecal mesentery.  I elevated the appendix.  I was able to free off the base of the appendix, which was still viable.  I stapled the appendix off the cecum using a laparoscopic bowel load stapler.  I took a healthy cuff viable cecum. I skeletonized & ligated the mesoappendix with a vascular load stapler.   I removed the appendix form the [facility] port.  I evaluated the abdomen laparoscopically.  The pelvis appeared to be normal.  Both ovaries were intact and noninflamed.  The colon and small bowel appeared normal as well.  There was no sign of pathology on the liver bed or gallbladder.  Patient had a normal-appearing stomach and spleen.  I do not see any other surgical cause for her pain.  I did copious irrigation. Hemostasis was good in the mesoappendix, colon mesentery, and retroperitoneum. Staple line was intact on  the cecum with no bleeding. I washed out the pelvis, retrohepatic space and right paracolic gutter.  Hemostasis is good. There was no perforation or injury.  Because the area cleaned up well after irrigation, I did not place a drain.  I aspirated the carbon dioxide. I removed the ports. I closed the umbilical fascia site  using a 0 Vicryl stitch. I closed skin using 4-0 vicryl stitch.  Sterile dressings were applied.  Patient was extubated and sent to the recovery room.  I discussed the operative findings with the patient's family. I suspect the patient is going used in the hospital at least overnight and will need antibiotics for 0 days. Questions answered. They expressed understanding and appreciation.

## 2017-05-17 NOTE — Transfer of Care (Signed)
Immediate Anesthesia Transfer of Care Note  Patient: Carol Diaz  Procedure(s) Performed: DIAGNOSTIC LAPAROSCOPY APPENDECTOMY (N/A Abdomen)  Patient Location: PACU  Anesthesia Type:General  Level of Consciousness: alert , drowsy and patient cooperative  Airway & Oxygen Therapy: Patient Spontanous Breathing and Patient connected to face mask oxygen  Post-op Assessment: Report given to RN and Post -op Vital signs reviewed and stable  Post vital signs: Reviewed and stable  Last Vitals:  Vitals:   05/17/17 0924 05/17/17 1115  BP: 134/77 (!) 148/82  Pulse: 78 92  Resp: 18 18  Temp: 37.1 C 37.1 C  SpO2: 100% 100%    Last Pain:  Vitals:   05/17/17 1115  TempSrc: Oral  PainSc:       Patients Stated Pain Goal: 2 (05/17/17 0909)  Complications: No apparent anesthesia complications

## 2017-05-17 NOTE — Discharge Instructions (Signed)
Laparoscopic Appendectomy, Adult, Care After °These instructions give you information about caring for yourself after your procedure. Your doctor may also give you more specific instructions. Call your doctor if you have any problems or questions after your procedure. °Follow these instructions at home: °Medicines °· Take over-the-counter and prescription medicines only as told by your doctor. °· Do not drive for 24 hours if you received a sedative. °· Do not drive or use heavy machinery while taking prescription pain medicine. °· If you were prescribed an antibiotic medicine, take it as told by your doctor. Do not stop taking it even if you start to feel better. °Activity °· Do not lift anything that is heavier than 10 pounds (4.5 kg) for 3 weeks or as told by your doctor. °· Do not play contact sports for 3 weeks or as told by your doctor. °· Slowly return to your normal activities. °Bathing °· Keep your cuts from surgery (incisions) clean and dry. °? Gently wash the cuts with soap and water. °? Rinse the cuts with water until the soap is gone. °? Pat the cuts dry with a clean towel. Do not rub the cuts. °· You may take showers after 48 hours. °· Do not take baths, swim, or use a hot tub for 2 weeks or as told by your doctor. °Cut Care °· Follow instructions from your doctor about how to take care of your cuts. Make sure you: °? Wash your hands with soap and water before you change your bandage (dressing). If you do not have soap and water, use hand sanitizer. °? Change your bandage as told by your doctor. °? Leave stitches (sutures), skin glue, or skin tape (adhesive) strips in place. They may need to stay in place for 2 weeks or longer. If tape strips get loose and curl up, you may trim the loose edges. Do not remove tape strips completely unless your doctor says it is okay. °· Check your cuts every day for signs of infection. Check for: °? More redness, swelling, or pain. °? More fluid or  blood. °? Warmth. °? Pus or a bad smell. °Other Instructions °· If you were sent home with a drain, follow instructions from your doctor about how to use it and care for it. °· Take deep breaths. This helps to keep your lungs from getting swollen (inflamed). °· To help with constipation: °? Drink plenty of fluids. °? Eat plenty of fruits and vegetables. °· Keep all follow-up visits as told by your doctor. This is important. °Contact a doctor if: °· You have more redness, swelling, or pain around a cut from surgery. °· You have more fluid or blood coming from a cut. °· Your cut feels warm to the touch. °· You have pus or a bad smell coming from a cut or a bandage. °· The edges of a cut break open after the stitches have been taken out. °· You have pain in your shoulders that gets worse. °· You feel dizzy or you pass out (faint). °· You have shortness of breath. °· You keep feeling sick to your stomach (nauseous). °· You keep throwing up (vomiting). °· You get diarrhea or you cannot control your poop. °· You lose your appetite. °· You have swelling or pain in your legs. °Get help right away if: °· You have a fever. °· You get a rash. °· You have trouble breathing. °· You have sharp pains in your chest. °This information is not intended to replace advice given   to you by your health care provider. Make sure you discuss any questions you have with your health care provider. °Document Released: 01/07/2009 Document Revised: 08/19/2015 Document Reviewed: 08/31/2014 °Elsevier Interactive Patient Education © 2018 [employer] ° °CCS ______[facility] °LAPAROSCOPIC SURGERY: POST OP INSTRUCTIONS °Always review your discharge instruction sheet given to you by the facility where your surgery was performed. °IF YOU HAVE DISABILITY OR FAMILY LEAVE FORMS, YOU MUST BRING THEM TO THE OFFICE FOR PROCESSING.   °DO NOT GIVE THEM TO YOUR DOCTOR. ° °1. A prescription for pain medication may be given to you upon  discharge.  Take your pain medication as prescribed, if needed.  If narcotic pain medicine is not needed, then you may take acetaminophen (Tylenol) or ibuprofen (Advil) as needed. °2. Take your usually prescribed medications unless otherwise directed. °3. If you need a refill on your pain medication, please contact your pharmacy.  They will contact our office to request authorization. Prescriptions will not be filled after 5pm or on week-ends. °4. You should follow a light diet the first few days after arrival home, such as soup and crackers, etc.  Be sure to include lots of fluids daily. °5. Most patients will experience some swelling and bruising in the area of the incisions.  Ice packs will help.  Swelling and bruising can take several days to resolve.  °6. It is common to experience some constipation if taking pain medication after surgery.  Increasing fluid intake and taking a stool softener (such as Colace) will usually help or prevent this problem from occurring.  A mild laxative (Milk of Magnesia or Miralax) should be taken according to package instructions if there are no bowel movements after 48 hours. °7. Unless discharge instructions indicate otherwise, you may remove your bandages 24-48 hours after surgery, and you may shower at that time.  You may have steri-strips (small skin tapes) in place directly over the incision.  These strips should be left on the skin for 7-10 days.  If your surgeon used skin glue on the incision, you may shower in 24 hours.  The glue will flake off over the next 2-3 weeks.  Any sutures or staples will be removed at the office during your follow-up visit. °8. ACTIVITIES:  You may resume regular (light) daily activities beginning the next day--such as daily self-care, walking, climbing stairs--gradually increasing activities as tolerated.  You may have sexual intercourse when it is comfortable.  Refrain from any heavy lifting or straining until approved by your doctor. °a. You  may drive when you are no longer taking prescription pain medication, you can comfortably wear a seatbelt, and you can safely maneuver your car and apply brakes. °b. RETURN TO WORK:  __________________________________________________________ °9. You should see your doctor in the office for a follow-up appointment approximately 2-3 weeks after your surgery.  Make sure that you call for this appointment within a day or two after you arrive home to insure a convenient appointment time. °10. OTHER INSTRUCTIONS: __________________________________________________________________________________________________________________________ __________________________________________________________________________________________________________________________ °WHEN TO CALL YOUR DOCTOR: °1. Fever over 101.0 °2. Inability to urinate °3. Continued bleeding from incision. °4. Increased pain, redness, or drainage from the incision. °5. Increasing abdominal pain ° °The clinic staff is available to answer your questions during regular business hours.  Please don’t hesitate to call and ask to speak to one of the nurses for clinical concerns.  If you have a medical emergency, go to the nearest emergency room or call 911.  A surgeon from   [facility] is always on call at the hospital. °[address], [address], [place], [place]  [address] ? [address] [address], [place], [place]   [address] °[phone] ? [phone] ? FAX [phone] °Web site: [email or web address] ° °

## 2017-05-17 NOTE — Progress Notes (Signed)
Pt awake and alert.  She would like to eat and get around some, to see how she feels.  I told her she could go home later if she could tolerate diet, void, ambulate safely, and tolerate pain with oral pain meds.  Discussed with nursing.  She is ready to go if she is ready later.

## 2017-05-17 NOTE — Progress Notes (Signed)
Day of Surgery    CC:  abdominal pain, diarrhea, cramps  Subjective: Still complaining of RLQ pain going to her back.  NPO, for Laparoscopy this AM  Objective: Vital signs in last 24 hours: Temp:  [98.1 F (36.7 C)-98.6 F (37 C)] 98.2 F (36.8 C) (02/21 0559) Pulse Rate:  [77-86] 77 (02/21 0559) Resp:  [16-18] 16 (02/21 0559) BP: (121-140)/(68-76) 123/75 (02/21 0559) SpO2:  [99 %-100 %] 100 % (02/21 0559) Last BM Date: 05/15/17 960 PO 660 IV Urine x 5 Afebrile, VSS CBC Latest Ref Rng & Units 05/17/2017 05/16/2017 05/15/2017  WBC 4.0 - 10.5 K/uL 6.1 6.6 7.5  Hemoglobin 12.0 - 15.0 g/dL 40.912.1 11.8(L) 13.3  Hematocrit 36.0 - 46.0 % 37.0 35.9(L) 39.1  Platelets 150 - 400 K/uL 267 245 317     Intake/Output from previous day: 02/20 0701 - 02/21 0700 In: 1620.8 [P.O.:960; I.V.:660.8] Out: -  Intake/Output this shift: No intake/output data recorded.  General appearance: alert, cooperative and no distress Resp: clear to auscultation bilaterally GI: soft, complains of pain RLQ, no distension or signs of peritonitis  Lab Results:  Recent Labs    05/16/17 0507 05/17/17 0448  WBC 6.6 6.1  HGB 11.8* 12.1  HCT 35.9* 37.0  PLT 245 267    BMET Recent Labs    05/15/17 1146  NA 137  K 4.3  CL 109  CO2 21  GLUCOSE 82  BUN 23  CREATININE 1.02  CALCIUM 9.3   PT/INR No results for input(s): LABPROT, INR in the last 72 hours.  Recent Labs  Lab 05/15/17 1146  AST 18  ALT 25  BILITOT 0.3  PROT 6.7     Lipase     Component Value Date/Time   LIPASE 13 05/15/2017 1146     Medications: . cefoTEtan (CEFOTAN) IV  2 g Intravenous On Call to OR  . Chlorhexidine Gluconate Cloth  6 each Topical Daily  . mupirocin ointment  1 application Nasal BID  . pantoprazole (PROTONIX) IV  40 mg Intravenous QHS    Assessment/Plan Chronic fatigue syndrome Migraines Hypertension    abdominal pain, cramps, diarrhea Gastroenteritis vs appendicitis   FEN: IV  fluids/NPO ID:  None DVT:  SCD Foley:  None Follow up:  TBD   Plan:  OR later today.          LOS: 0 days    Meryem Haertel 05/17/2017 [phone]

## 2017-05-17 NOTE — Anesthesia Preprocedure Evaluation (Signed)
Anesthesia Evaluation  Patient identified by MRN, date of birth, ID band Patient awake    Reviewed: Allergy & Precautions, H&P , Patient's Chart, lab work & pertinent test results, reviewed documented beta blocker date and time   Airway Mallampati: II  TM Distance: >3 FB Neck ROM: full    Dental no notable dental hx.    Pulmonary    Pulmonary exam normal breath sounds clear to auscultation       Cardiovascular hypertension, On Medications  Rhythm:regular Rate:Normal     Neuro/Psych    GI/Hepatic   Endo/Other    Renal/GU      Musculoskeletal   Abdominal   Peds  Hematology   Anesthesia Other Findings   Reproductive/Obstetrics                             Anesthesia Physical Anesthesia Plan  ASA: II  Anesthesia Plan: General   Post-op Pain Management:    Induction: Intravenous  PONV Risk Score and Plan: 2 and Dexamethasone, Ondansetron, Treatment may vary due to age or medical condition and Scopolamine patch - Pre-op  Airway Management Planned: Oral ETT  Additional Equipment:   Intra-op Plan:   Post-operative Plan: Extubation in OR  Informed Consent: I have reviewed the patients History and Physical, chart, labs and discussed the procedure including the risks, benefits and alternatives for the proposed anesthesia with the patient or authorized representative who has indicated his/her understanding and acceptance.   Dental Advisory Given  Plan Discussed with: CRNA and Surgeon  Anesthesia Plan Comments: (  )        Anesthesia Quick Evaluation

## 2017-05-17 NOTE — Anesthesia Procedure Notes (Signed)
Procedure Name: Intubation Date/Time: 05/17/2017 12:07 PM Performed by: West Pugh, CRNA Pre-anesthesia Checklist: Patient identified, Emergency Drugs available, Suction available, Patient being monitored and Timeout performed Patient Re-evaluated:Patient Re-evaluated prior to induction Oxygen Delivery Method: Circle system utilized Preoxygenation: Pre-oxygenation with 100% oxygen Induction Type: IV induction and Cricoid Pressure applied Ventilation: Mask ventilation without difficulty Laryngoscope Size: Mac and 4 Grade View: Grade I Tube type: Oral Airway Equipment and Method: Stylet Placement Confirmation: ETT inserted through vocal cords under direct vision,  positive ETCO2,  CO2 detector and breath sounds checked- equal and bilateral Tube secured with: Tape Dental Injury: Teeth and Oropharynx as per pre-operative assessment

## 2017-05-17 NOTE — Anesthesia Postprocedure Evaluation (Signed)
Anesthesia Post Note  Patient: Carol Diaz  Procedure(s) Performed: DIAGNOSTIC LAPAROSCOPY APPENDECTOMY (N/A Abdomen)     Patient location during evaluation: PACU Anesthesia Type: General Level of consciousness: awake and alert Pain management: pain level controlled Vital Signs Assessment: post-procedure vital signs reviewed and stable Respiratory status: spontaneous breathing, nonlabored ventilation and respiratory function stable Cardiovascular status: blood pressure returned to baseline and stable Postop Assessment: no apparent nausea or vomiting Anesthetic complications: no    Last Vitals:  Vitals:   05/17/17 1400 05/17/17 1410  BP: 126/83 133/73  Pulse: 82 80  Resp: 12 13  Temp:  36.8 C  SpO2: 97% 100%    Last Pain:  Vitals:   05/17/17 1400  TempSrc:   PainSc: Asleep                 Silvanna Ohmer,W. EDMOND

## 2017-05-18 ENCOUNTER — Encounter (HOSPITAL_COMMUNITY): Payer: Self-pay | Admitting: General Surgery

## 2017-05-18 ENCOUNTER — Ambulatory Visit: Payer: BC Managed Care – PPO | Admitting: Family Medicine

## 2017-05-22 NOTE — Discharge Summary (Signed)
Physician Discharge Summary  Patient ID: Carol Diaz MRN: [id number] DOB/AGE: 52-Oct-1967 52 y.o.  Admit date: 05/15/2017 Discharge date: 05/18/2017  Admission Diagnoses:   abdominal pain, cramps, diarrhea Gastroenteritis vs appendicitis Chronic fatigue syndrome Migraines Hypertension   Discharge Diagnoses:  RIGHT LOWER QUAD PAIN, DILATED APPENDICEAL BASE Chronic fatigue syndrome Migraines Hypertension    Active Problems:   Acute abdominal pain in right lower quadrant   Abdominal pain   PROCEDURES: DIAGNOSTIC LAPAROSCOPY,  APPENDECTOMY, 05/17/17, Dr. Romie Levee Appendix, Other than Incidental - ACUTE APPENDICITIS.    Hospital Course:  Patient is a 52 year old white female referred by her primary care physician for surgical evaluation of possible acute appendicitis.  Patient has had abdominal discomfort, crampy abdominal pain, and diarrheal stools for the past 4-5 days.  She notes that pain is worse with eating.  She ate a normal lunch time meal today at approximately 2 PM.  She has had mild nausea this afternoon.  She denies emesis.  She denies fevers or chills or sweats.  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  Patient was evaluated this morning at the office of her primary care physician, Dr. Alden Hipp.  She was seen by Dr. Karie Schwalbe at that office.  Laboratory studies were obtained which show a normal white blood cell count of 7.5.  Differential was normal.  Vital signs are normal and temperature is currently normal at 98.4.  Patient was referred to radiology where a CT scan of the abdomen was performed.  This demonstrated a mildly thickened 9 mm appendix in a retrocecal location.  There were no acute inflammatory changes.  There were no other acute findings within the abdomen.  Patient has had prior cesarean section on 2 occasions.  No other abdominal surgery.  Patient does have a history of irritable bowel  syndrome and fibromyalgia managed by her primary care physician.  She has not had a colonoscopy.  Patient presents to the emergency department accompanied by her husband for surgical evaluation for possible acute appendicitis.  Patient was seen in the emergency department and admitted by Dr. Gerrit Friends.  He recommended observation for general surgery, IV hydration with clear liquids overnight repeat CBC in the a.m. along with abdominal exam.    Patient was seen the following a.m. and her exam was unchanged.  Her WBC remained normal at 6.6.  She was afebrile and her vital signs were stable.  Her diarrhea had resolved.  After exam by Dr. Maisie Fus it was her recommendation she stay another 24 hours for observation.  If she were no better she would recommend laparoscopic appendectomy in the a.m.  She continued to have discomfort.  Repeat CBC was again normal at 6.1.  She was taken the operating room later that day for diagnostic laparoscopy and appendectomy.  She tolerated the procedure well.  And was able to go home later that evening after dinner.  Condition on discharge: Improved.  CBC Latest Ref Rng & Units 05/17/2017 05/16/2017 05/15/2017  WBC 4.0 - 10.5 K/uL 6.1 6.6 7.5  Hemoglobin 12.0 - 15.0 g/dL 95.6 11.8(L) 13.3  Hematocrit 36.0 - 46.0 % 37.0 35.9(L) 39.1  Platelets 150 - 400 K/uL 267 245 317    CMP Latest Ref Rng & Units 05/15/2017 07/17/2016 05/24/2016  Glucose 65 - 99 mg/dL 82 78 -  BUN 7 - 25 mg/dL 23 21 -  Creatinine 2.13 - 1.05 mg/dL 0.86 5.78 -  Sodium 469 - 146 mmol/L 137  140 -  Potassium 3.5 - 5.3 mmol/L 4.3 4.4 -  Chloride 98 - 110 mmol/L 109 109 -  CO2 20 - 32 mmol/L 21 18(L) -  Calcium 8.6 - 10.4 mg/dL 9.3 9.4 9.5  Total Protein 6.1 - 8.1 g/dL 6.7 - -  Total Bilirubin 0.2 - 1.2 mg/dL 0.3 - -  Alkaline Phos 33 - 115 U/L - - -  AST 10 - 35 U/L 18 - -  ALT 6 - 29 U/L 25 - -   Diagnosis Appendix, Other than Incidental - ACUTE APPENDICITIS.  Disposition: 01-Home or Self  Care   Allergies as of 05/17/2017      Reactions   Fluoxetine Other (See Comments)   Made her tremor worse   Lexapro [escitalopram Oxalate] Other (See Comments)   Headache   Morphine Nausea And Vomiting   Sertraline Other (See Comments)   Headache      Medication List    STOP taking these medications   diphenoxylate-atropine 2.5-0.025 MG tablet Commonly known as:  LOMOTIL     TAKE these medications   acetaminophen 325 MG tablet Commonly known as:  TYLENOL Two tablets every 4 hours as needed for pain.  You can buy over the counter at any drug store.   cyclobenzaprine 10 MG tablet Commonly known as:  FLEXERIL TAKE 1/2 TO 1 TABLET BY MOUTH THREE TIMES DAILY AS NEEDED FOR MUSCLE SPASM What changed:  See the new instructions.   DULoxetine 60 MG capsule Commonly known as:  CYMBALTA Take 1 capsule (60 mg total) by mouth daily. After 2 weeks can increase to twice a day. What changed:  additional instructions   fluticasone 50 MCG/ACT nasal spray Commonly known as:  FLONASE One spray in each nostril twice a day, use left hand for right nostril, and right hand for left nostril. What changed:    how much to take  how to take this  when to take this  reasons to take this  additional instructions   gabapentin 100 MG capsule Commonly known as:  NEURONTIN take 1 capsule by mouth every morning then 1 every evening and 2 at bedtime IF TOLERATED What changed:    how much to take  how to take this  when to take this  additional instructions   LINZESS 290 MCG Caps capsule Generic drug:  linaclotide TAKE 1 CAPSULE BY MOUTH DAILY 30 MINUTES BEFORE FIRST MEAL OF THE DAY   metFORMIN 500 MG tablet Commonly known as:  GLUCOPHAGE TAKE 1 TABLET(500 MG) BY MOUTH TWICE DAILY WITH A MEAL   metoCLOPramide 10 MG tablet Commonly known as:  REGLAN Take 1 tablet (10 mg total) by mouth 2 (two) times daily as needed (migraine).   nabumetone 500 MG tablet Commonly known as:   RELAFEN TAKE 1 TABLET(500 MG) BY MOUTH TWICE DAILY AS NEEDED What changed:  See the new instructions.   omeprazole 40 MG capsule Commonly known as:  PRILOSEC Take 1 capsule (40 mg total) by mouth daily.   ondansetron 8 MG disintegrating tablet Commonly known as:  ZOFRAN-ODT Take 1 tablet (8 mg total) by mouth every 8 (eight) hours as needed for nausea.   promethazine 25 MG tablet Commonly known as:  PHENERGAN Take 1 tablet (25 mg total) by mouth every 6 (six) hours as needed for nausea.   propranolol ER 60 MG 24 hr capsule Commonly known as:  INDERAL LA take 1 capsule by mouth daily   SETLAKIN 0.15-0.03 MG tablet Generic drug:  levonorgestrel-ethinyl  estradiol take 1 tablet by mouth daily   SUMAtriptan 100 MG tablet Commonly known as:  IMITREX take 1 tablet by mouth every 2 hours if needed for migraines as directed   Suvorexant 10 MG Tabs Commonly known as:  BELSOMRA Take 10 mg at bedtime by mouth.   topiramate 50 MG tablet Commonly known as:  TOPAMAX TAKE 1 TABLET(50 MG) BY MOUTH AT BEDTIME   traMADol 50 MG tablet Commonly known as:  ULTRAM Take 1 tablet (50 mg total) by mouth every 6 (six) hours as needed (mild pain).      Follow-up Information    [facility] Follow up on 05/31/2017.   Specialty:  General Surgery Why:  Your appointment is at 1:45 PM.  Be at the office 30 minutes early for check in.  Bring photo ID and insurance information. Contact information: [address] [place] [address] [phone]           Signed: Sherrie George 05/22/2017, 2:37 PM

## 2017-05-24 ENCOUNTER — Telehealth: Payer: Self-pay | Admitting: *Deleted

## 2017-05-24 ENCOUNTER — Telehealth: Payer: Self-pay

## 2017-05-24 ENCOUNTER — Ambulatory Visit: Payer: BC Managed Care – PPO | Admitting: Family Medicine

## 2017-05-24 ENCOUNTER — Encounter: Payer: Self-pay | Admitting: Family Medicine

## 2017-05-24 VITALS — BP 123/75 | HR 85 | Ht 66.0 in | Wt 145.0 lb

## 2017-05-24 DIAGNOSIS — M25532 Pain in left wrist: Secondary | ICD-10-CM

## 2017-05-24 DIAGNOSIS — M797 Fibromyalgia: Secondary | ICD-10-CM

## 2017-05-24 DIAGNOSIS — M542 Cervicalgia: Secondary | ICD-10-CM | POA: Diagnosis not present

## 2017-05-24 DIAGNOSIS — F5101 Primary insomnia: Secondary | ICD-10-CM

## 2017-05-24 DIAGNOSIS — Z9049 Acquired absence of other specified parts of digestive tract: Secondary | ICD-10-CM | POA: Diagnosis not present

## 2017-05-24 MED ORDER — GABAPENTIN 100 MG PO CAPS: 100.0000 mg | ORAL_CAPSULE | Freq: Every day | ORAL | 0 refills | Status: DC

## 2017-05-24 MED ORDER — CYCLOBENZAPRINE HCL 10 MG PO TABS: 5.0000 mg | ORAL_TABLET | Freq: Every evening | ORAL | 0 refills | Status: DC | PRN

## 2017-05-24 MED ORDER — DULOXETINE HCL 60 MG PO CPEP: 60.0000 mg | ORAL_CAPSULE | Freq: Every day | ORAL | 0 refills | Status: DC

## 2017-05-24 NOTE — Progress Notes (Signed)
Subjective:    CC: Sleep, fibro    HPI:  52 year old female is here today for routine follow-up.  But since I last saw her she was actually admitted to the hospital on February 19 for acute appendicitis and had an appendectomy performed on February 21. Having problems getting her FMLA forms completed.  That she has called the surgeon's office multiple times and is not received a phone call back.  She also wants me to look at her right medial wrist at the base of the thumb.  That is where she had her IV N.  She said it was painful when they put it in and she thinks he may have had a nerve.  She says now if she extends the wrist she will feel a sharp pain.  Also bruised and little swollen.  She has not been doing any specific treatments.  Follow-up fibromyalgia-been a little over 3 months since I last saw her.  Follow-up insomnia-we decided to try Belsomra.  She had been using a muscle relaxer occasionally but it did not seem to be helping very much.  She actually has been sleeping a little better since her surgery but she feels like it is just because she is really tired.  She really has not tried the Belsomra consistently..  Past medical history, Surgical history, Family history not pertinant except as noted below, Social history, Allergies, and medications have been entered into the medical record, reviewed, and corrections made.   Review of Systems: No fevers, chills, night sweats, weight loss, chest pain, or shortness of breath.  Otherwise negative except for HPI. Objective:    General: Well Developed, well nourished, and in no acute distress.  Neuro: Alert and oriented x3, extra-ocular muscles intact, sensation grossly intact.  HEENT: Normocephalic, atraumatic  Skin: Warm and dry, no rashes. Cardiac: Regular rate and rhythm, no murmurs rubs or gallops, no lower extremity edema.  Respiratory: Clear to auscultation bilaterally. Not using accessory muscles, speaking in full sentences. MSK:    She has some significant bruising over the medial side of her wrist at the base of the thumb and a little bit of swelling.  But no hardening or cord like features of the vein itself.  No sign of trauma of phlebitis.    Impression and Recommendations:    Insomnia - not really taking the belsomra right now. She has been sleeping ok and has been very tired from her surgery.  We can f/u up med again in 3 months.    Fibromyalgia - dong well overall. Will continue current regimen. She is on 60mg  Cymbalta. Never went up on dose but that is good bc she is also on tramadol.  Combo can inc seizure risk if on high doses.    S/P appendectomy - still revering. Says her wound is looking good and she has a follow-up appointment next week.  Will call colonic surgery and see what we need to do about her FMLA.  She has not been able to get a response from them so we will see if we can assist with this.  Trauma over the site of the IV over the left wrist-recommend cool compresses, anti-inflammatory and gentle range of motion exercises without lifting a lot of weight over the next couple of weeks until it heals.  In regards to her nausea medication, I would prefer she pick whoich one she prefers and remove one of them. She will let me know next time she is here.

## 2017-05-24 NOTE — Telephone Encounter (Signed)
Patient's daughter called in re patient needed colonoscopy but would like to wait until after patient has recovered from her appendectomy.   Returned patient's daughter call - advised colonoscopy can definitely wait until after patient's recovery from surgery.

## 2017-05-24 NOTE — Telephone Encounter (Signed)
Called [facility] had to lvm asked that someone call back regarding pt's FMLA forms. I advised that pt has left multiple voicemail's regarding this and has not heard anything back from their office. Laureen Ochs.Rastus Borton, Viann Shoveonya [name], CMA

## 2017-06-09 ENCOUNTER — Other Ambulatory Visit: Payer: Self-pay | Admitting: Family Medicine

## 2017-06-14 ENCOUNTER — Ambulatory Visit: Payer: BC Managed Care – PPO | Admitting: Family Medicine

## 2017-06-14 ENCOUNTER — Encounter: Payer: Self-pay | Admitting: Family Medicine

## 2017-06-14 VITALS — BP 113/58 | HR 80 | Temp 98.8°F | Ht 66.0 in | Wt 145.0 lb

## 2017-06-14 DIAGNOSIS — J029 Acute pharyngitis, unspecified: Secondary | ICD-10-CM

## 2017-06-14 LAB — POCT RAPID STREP A (OFFICE): RAPID STREP A SCREEN: NEGATIVE

## 2017-06-14 MED ORDER — AZITHROMYCIN 250 MG PO TABS: ORAL_TABLET | ORAL | 0 refills | Status: AC

## 2017-06-14 NOTE — Patient Instructions (Signed)

## 2017-06-14 NOTE — Progress Notes (Signed)
   Subjective:    Patient ID: Carol Diaz, female    DOB: 1965-12-26, 52 y.o.   MRN: [id number]  HPI 52 year old female comes in today complaining 6 days of ST, dry cough. Painful to swallow.  She is been using some cough syrup and throat lozenges.  They help temporarily.  Had some mild nasal congestion and her voice is been going in and out.  No fevers chills or sweats.  She is been taking over-the-counter cough and cold medications.  Has felt a little bit tight across her chest.   Review of Systems     Objective:   Physical Exam  Constitutional: She is oriented to person, place, and time. She appears well-developed and well-nourished.  HENT:  Head: Normocephalic and atraumatic.  Right Ear: External ear normal.  Left Ear: External ear normal.  Nose: Nose normal.  Mouth/Throat: Oropharynx is clear and moist.  TMs and canals are clear.   Eyes: Pupils are equal, round, and reactive to light. Conjunctivae and EOM are normal.  Neck: Neck supple. No thyromegaly present.  Cardiovascular: Normal rate, regular rhythm and normal heart sounds.  Pulmonary/Chest: Effort normal and breath sounds normal. She has no wheezes.  Lymphadenopathy:    She has no cervical adenopathy.  Neurological: She is alert and oriented to person, place, and time.  Skin: Skin is warm and dry.  Psychiatric: She has a normal mood and affect.        Assessment & Plan:  URI - likley viral. Strep test is negative.  Continue symptomatic care.  If she feels like she is not getting better by the weekend or if the nasal congestion gets worse and okay to fill the prescription for azithromycin.

## 2017-06-30 ENCOUNTER — Other Ambulatory Visit: Payer: Self-pay | Admitting: Family Medicine

## 2017-07-07 ENCOUNTER — Other Ambulatory Visit: Payer: Self-pay | Admitting: Family Medicine

## 2017-07-28 ENCOUNTER — Other Ambulatory Visit: Payer: Self-pay | Admitting: Family Medicine

## 2017-08-18 ENCOUNTER — Other Ambulatory Visit: Payer: Self-pay | Admitting: Family Medicine

## 2017-08-21 ENCOUNTER — Encounter: Payer: Self-pay | Admitting: Family Medicine

## 2017-08-21 ENCOUNTER — Ambulatory Visit: Payer: BC Managed Care – PPO | Admitting: Family Medicine

## 2017-08-21 VITALS — BP 111/64 | HR 78 | Ht 66.0 in | Wt 148.0 lb

## 2017-08-21 DIAGNOSIS — Z1211 Encounter for screening for malignant neoplasm of colon: Secondary | ICD-10-CM

## 2017-08-21 DIAGNOSIS — E7800 Pure hypercholesterolemia, unspecified: Secondary | ICD-10-CM | POA: Diagnosis not present

## 2017-08-21 DIAGNOSIS — M797 Fibromyalgia: Secondary | ICD-10-CM | POA: Diagnosis not present

## 2017-08-21 DIAGNOSIS — F5101 Primary insomnia: Secondary | ICD-10-CM | POA: Diagnosis not present

## 2017-08-21 DIAGNOSIS — R140 Abdominal distension (gaseous): Secondary | ICD-10-CM

## 2017-08-21 NOTE — Progress Notes (Signed)
Subjective:    CC: Fibro and sleep  HPI:  F/U fibromylagia -overall she is been doing well on her Cymbalta.  Is currently on 60 mg.  To having a lot of hip pain bilaterally but says is not nearly as bad as it was.  In her job now she actually does get up and move around and is not quite a sedentary which actually think is been good.  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  F/U Insomnia -she is actually doing fairly well with her sleep.  In fact she is really used at the [facility].  Hyperlipidemia -due to recheck lipids.  pt reports that she has been experiencing some gas and bloating and tried gas-ex this has not helped. she will be getting in touch w/GI today area and she has noticed some occasional bright red blood with her bowel movements but feels like she is going very regularly.  She does not feel constipated or backed up at all.  She does take Linzess.  She really has reviewed her diet has not found anything out of the norm or out of her usual.  She is also had a decreased appetite.  In fact that time she gets home she feels so bad that she often skips dinner because she feels like it will make her feel worse.  Then when she does not eat she gets a headache.  Migraine headaches-currently on topiramate and doing well.  She had a couple of bad headaches since I last saw her.  Past medical history, Surgical history, Family history not pertinant except as noted below, Social history, Allergies, and medications have been entered into the medical record, reviewed, and corrections made.   Review of Systems: No fevers, chills, night sweats, weight loss, chest pain, or shortness of breath.   Objective:    General: Well Developed, well nourished, and in no acute distress.  Neuro: Alert and oriented x3, extra-ocular muscles intact, sensation grossly intact.  HEENT: Normocephalic, atraumatic  Skin: Warm and dry, no rashes. Cardiac: Regular rate and rhythm, no murmurs  rubs or gallops, no lower extremity edema.  Respiratory: Clear to auscultation bilaterally. Not using accessory muscles, speaking in full sentences.   Impression and Recommendations:    Fibromyalgia -stable.  Continue current regimen.  Follow-up in 3 to 4 months.  Insomnia -stable.  Just use Belsomra as needed.  She is using it very infrequently currently.  Hyperlipidemia - due to recheck labs.   Gas/bloating -this is a trial of a probiotic.  She plans on calling her GI today to see if she can get in.

## 2017-08-21 NOTE — Patient Instructions (Addendum)
Can try a probiotic like Align or Cuterelle.

## 2017-08-22 ENCOUNTER — Encounter: Payer: Self-pay | Admitting: Family Medicine

## 2017-08-22 DIAGNOSIS — R195 Other fecal abnormalities: Secondary | ICD-10-CM

## 2017-08-22 DIAGNOSIS — R140 Abdominal distension (gaseous): Secondary | ICD-10-CM

## 2017-08-22 NOTE — Telephone Encounter (Signed)
Lab ordered. She can go at her convenience.

## 2017-08-24 LAB — LIPID PANEL
Cholesterol: 265 mg/dL — ABNORMAL HIGH (ref <200)
HDL: 47 mg/dL — ABNORMAL LOW (ref >50)
LDL CHOLESTEROL (CALC): 183 mg/dL — AB
NON-HDL CHOLESTEROL (CALC): 218 mg/dL — AB (ref <130)
Total CHOL/HDL Ratio: 5.6 (calc) — ABNORMAL HIGH (ref <5.0)
Triglycerides: 193 mg/dL — ABNORMAL HIGH (ref <150)

## 2017-08-27 LAB — CELIAC DISEASE COMPREHENSIVE PANEL WITH REFLEXES
(tTG) Ab, IgA: 1 U/mL
Immunoglobulin A: 138 mg/dL (ref 81–463)

## 2017-08-27 MED ORDER — ATORVASTATIN CALCIUM 20 MG PO TABS: 20.0000 mg | ORAL_TABLET | Freq: Every day | ORAL | 3 refills | Status: DC

## 2017-08-27 NOTE — Addendum Note (Signed)
Addended by: Nani GasserMETHENEY, Elvin Mccartin D on: 08/27/2017 07:40 AM   Modules accepted: Orders

## 2017-09-08 ENCOUNTER — Other Ambulatory Visit: Payer: Self-pay | Admitting: Family Medicine

## 2017-09-13 ENCOUNTER — Other Ambulatory Visit: Payer: Self-pay | Admitting: Family Medicine

## 2017-09-13 LAB — HM COLONOSCOPY

## 2017-09-20 ENCOUNTER — Other Ambulatory Visit: Payer: Self-pay | Admitting: Family Medicine

## 2017-09-29 ENCOUNTER — Other Ambulatory Visit: Payer: Self-pay | Admitting: Family Medicine

## 2017-09-30 ENCOUNTER — Other Ambulatory Visit: Payer: Self-pay | Admitting: Family Medicine

## 2017-09-30 DIAGNOSIS — G43009 Migraine without aura, not intractable, without status migrainosus: Secondary | ICD-10-CM

## 2017-10-02 ENCOUNTER — Encounter: Payer: Self-pay | Admitting: Family Medicine

## 2017-11-03 ENCOUNTER — Other Ambulatory Visit: Payer: Self-pay | Admitting: Family Medicine

## 2017-11-15 ENCOUNTER — Encounter: Payer: Self-pay | Admitting: Family Medicine

## 2017-11-15 ENCOUNTER — Ambulatory Visit: Payer: BC Managed Care – PPO | Admitting: Family Medicine

## 2017-11-15 VITALS — BP 109/62 | HR 77 | Ht 66.0 in | Wt 145.0 lb

## 2017-11-15 DIAGNOSIS — Z23 Encounter for immunization: Secondary | ICD-10-CM

## 2017-11-15 DIAGNOSIS — M7062 Trochanteric bursitis, left hip: Secondary | ICD-10-CM

## 2017-11-15 DIAGNOSIS — G43009 Migraine without aura, not intractable, without status migrainosus: Secondary | ICD-10-CM | POA: Diagnosis not present

## 2017-11-15 DIAGNOSIS — R928 Other abnormal and inconclusive findings on diagnostic imaging of breast: Secondary | ICD-10-CM

## 2017-11-15 DIAGNOSIS — M7061 Trochanteric bursitis, right hip: Secondary | ICD-10-CM

## 2017-11-15 DIAGNOSIS — M542 Cervicalgia: Secondary | ICD-10-CM | POA: Diagnosis not present

## 2017-11-15 DIAGNOSIS — J302 Other seasonal allergic rhinitis: Secondary | ICD-10-CM

## 2017-11-15 DIAGNOSIS — H938X2 Other specified disorders of left ear: Secondary | ICD-10-CM

## 2017-11-15 MED ORDER — TOPIRAMATE 50 MG PO TABS: ORAL_TABLET | ORAL | 3 refills | Status: DC

## 2017-11-15 MED ORDER — CYCLOBENZAPRINE HCL 10 MG PO TABS: ORAL_TABLET | ORAL | 1 refills | Status: DC

## 2017-11-15 MED ORDER — METFORMIN HCL 500 MG PO TABS: ORAL_TABLET | ORAL | 1 refills | Status: DC

## 2017-11-15 MED ORDER — FLUTICASONE PROPIONATE 50 MCG/ACT NA SUSP: NASAL | 3 refills | Status: DC

## 2017-11-15 MED ORDER — DULOXETINE HCL 60 MG PO CPEP: 60.0000 mg | ORAL_CAPSULE | Freq: Every day | ORAL | 3 refills | Status: DC

## 2017-11-15 NOTE — Progress Notes (Signed)
Subjective:    Patient ID: Carol Diaz, female    DOB: [birth year], 52 y.o.   MRN: [id number]  HPI 52 year old female comes in for 90-month follow-up for fibromyalgia today.  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  She is currently on Cymbalta and gabapentin.  Last time I saw her she was dealing with a lot of gas and bloating.  He does have a history of irritable bowel syndrome.  Recommended a trial of a probiotic and recommended she get back in with her GI doctor.  She did have a colonoscopy done in June and everything looked great.  Migraine headaches-she is currently on topiramate for prophylaxis and uses Imitrex for rescue.  She doing really well and says rarely gets a migraine.  She also c/o of bilateral hip pain.  Is been going on for months but it seems to be getting gradually worse to the point where she is having difficulty sleeping at night.  Tossing and turning.  She been tried buying a special pillow that helps take pressure off of her hips.  She has still been trying to exercise some.  Wants to take a left look in her left ear.  She is noticed that it has been fluttering and pounding on and off for about 3 weeks.  She denies any significant cold symptoms.  She has been a little bit more congested in the mornings but otherwise asymptomatic.  She is not currently using her Flonase.  Review of Systems  BP 109/62   Pulse 77   Ht 5\' 6"  (1.676 m)   Wt 145 lb (65.8 kg)   SpO2 100%   BMI 23.40 kg/m     Allergies  Allergen Reactions  . Fluoxetine Other (See Comments)    Made her tremor worse   . Lexapro [Escitalopram Oxalate] Other (See Comments)    Headache  . Morphine Nausea And Vomiting  . Sertraline Other (See Comments)    Headache     Past Medical History:  Diagnosis Date  . Chronic fatigue syndrome   . Hypertension   . Migraines   . Palpitations     Past Surgical History:  Procedure Laterality Date  . CESAREAN  SECTION    . ivp    . LAPAROSCOPIC APPENDECTOMY N/A 05/17/2017   Procedure: DIAGNOSTIC LAPAROSCOPY APPENDECTOMY;  Surgeon: Romie Levee, MD;  Location: [facility];  Service: General;  Laterality: N/A;  . TUBAL LIGATION      Social History   Socioeconomic History  . Marital status: Married    Spouse name: Not on file  . Number of children: 2  . Years of education: Not on file  . Highest education level: Not on file  Occupational History    Employer: [employer]  Social Needs  . Financial resource strain: Not on file  . Food insecurity:    Worry: Not on file    Inability: Not on file  . Transportation needs:    Medical: Not on file    Non-medical: Not on file  Tobacco Use  . Smoking status: Never Smoker  . Smokeless tobacco: Never Used  Substance and Sexual Activity  . Alcohol use: No  . Drug use: No  . Sexual activity: Yes    Partners: Male    Birth control/protection: Pill  Lifestyle  . Physical activity:    Days per week: Not on file    Minutes per session: Not on file  .  Stress: Not on file  Relationships  . Social connections:    Talks on phone: Not on file    Gets together: Not on file    Attends religious service: Not on file    Active member of club or organization: Not on file    Attends meetings of clubs or organizations: Not on file    Relationship status: Not on file  . Intimate partner violence:    Fear of current or ex partner: Not on file    Emotionally abused: Not on file    Physically abused: Not on file    Forced sexual activity: Not on file  Other Topics Concern  . Not on file  Social History Narrative   Some exercise.     Family History  Problem Relation Age of Onset  . Emphysema Mother   . Hypertension Father   . Heart disease Brother 1150       mi    Outpatient Encounter Medications as of 11/15/2017  Medication Sig  . acetaminophen (TYLENOL) 325 MG tablet Two tablets every 4 hours as needed for pain.  You can buy over the  counter at any drug store.  Marland Kitchen. atorvastatin (LIPITOR) 20 MG tablet Take 1 tablet (20 mg total) by mouth daily.  . cyclobenzaprine (FLEXERIL) 10 MG tablet TAKE 1/2 TO 1 TABLET BY MOUTH THREE TIMES DAILY AS NEEDED FOR MUSCLE SPASM  . DULoxetine (CYMBALTA) 60 MG capsule Take 1 capsule (60 mg total) by mouth daily.  . fluticasone (FLONASE) 50 MCG/ACT nasal spray One spray in each nostril twice a day, use left hand for right nostril, and right hand for left nostril.  Marland Kitchen. gabapentin (NEURONTIN) 100 MG capsule Take 1 capsule (100 mg total) by mouth at bedtime.  . INTROVALE 0.15-0.03 MG tablet TAKE 1 TABLET BY MOUTH DAILY  . LINZESS 290 MCG CAPS capsule TAKE 1 CAPSULE BY MOUTH DAILY 30 MINUTES BEFORE FIRST MEAL OF THE DAY  . metFORMIN (GLUCOPHAGE) 500 MG tablet TAKE 1 TABLET(500 MG) BY MOUTH TWICE DAILY WITH A MEAL  . nabumetone (RELAFEN) 500 MG tablet Take 1 tablet (500 mg total) by mouth at bedtime as needed.  Marland Kitchen. omeprazole (PRILOSEC) 40 MG capsule Take 1 capsule (40 mg total) by mouth daily.  . propranolol ER (INDERAL LA) 60 MG 24 hr capsule TAKE 1 CAPSULE BY MOUTH DAILY  . SUMAtriptan (IMITREX) 100 MG tablet take 1 tablet by mouth every 2 hours if needed for migraines as directed  . topiramate (TOPAMAX) 50 MG tablet TAKE 1 TABLET(50 MG) BY MOUTH AT BEDTIME  . [DISCONTINUED] cyclobenzaprine (FLEXERIL) 10 MG tablet TAKE 1/2 TO 1 TABLET BY MOUTH THREE TIMES DAILY AS NEEDED FOR MUSCLE SPASM  . [DISCONTINUED] DULoxetine (CYMBALTA) 60 MG capsule Take 1 capsule (60 mg total) by mouth daily.  . [DISCONTINUED] fluticasone (FLONASE) 50 MCG/ACT nasal spray One spray in each nostril twice a day, use left hand for right nostril, and right hand for left nostril. (Patient taking differently: Place 1 spray into both nostrils 2 (two) times daily as needed for allergies. One spray in each nostril twice a day, use left hand for right nostril, and right hand for left nostril.)  . [DISCONTINUED] metFORMIN (GLUCOPHAGE) 500 MG  tablet TAKE 1 TABLET(500 MG) BY MOUTH TWICE DAILY WITH A MEAL  . [DISCONTINUED] metoCLOPramide (REGLAN) 10 MG tablet Take 1 tablet (10 mg total) by mouth 2 (two) times daily as needed (migraine).  . [DISCONTINUED] topiramate (TOPAMAX) 50 MG tablet TAKE 1 TABLET(50  MG) BY MOUTH AT BEDTIME   No facility-administered encounter medications on file as of 11/15/2017.        Objective:   Physical Exam  Constitutional: She is oriented to person, place, and time. She appears well-developed and well-nourished.  HENT:  Head: Normocephalic and atraumatic.  Right Ear: External ear normal.  Left Ear: External ear normal.  Nose: Nose normal.  Mouth/Throat: Oropharynx is clear and moist.  Right TM and canal is normal.  Left canal is normal.  Left tympanic membrane with a distorted light reflex.  In fact it has a stacked appearance.  Eyes: Pupils are equal, round, and reactive to light. Conjunctivae and EOM are normal.  Neck: Neck supple. No thyromegaly present.  Cardiovascular: Normal rate, regular rhythm and normal heart sounds.  Pulmonary/Chest: Effort normal and breath sounds normal. She has no wheezes.  Lymphadenopathy:    She has no cervical adenopathy.  Neurological: She is alert and oriented to person, place, and time.  Skin: Skin is warm and dry.  Psychiatric: She has a normal mood and affect.       Assessment & Plan:  Fibromyalgia- Stable. Continue current regimen.    Gas and bloating-colonoscopy was normal in June.  Repeat in 10 years.  Migraine headaches-stable.  No recent flares or exacerbations.  Overall well controlled.  Refill Topamax today.  Bilateral hip pain-most consistent with trochanteric bursitis.  We discussed options.  Given handout for home stretches to do on her own at home.  Actually when I have her hold her statin for a couple weeks just to see if that could be causing or exacerbating her symptoms.  Because she has been on the statin now for about 3 months.  In  addition consider bilateral steroid injections if not improving.  Left ear pounding-on exam there is a little bit more pressure behind the tympanic membrane as her light reflex is distorted on that side.  Recommend restarting her fluticasone to see if this helps with her symptoms.  If not improving then please let me know.

## 2017-11-24 ENCOUNTER — Other Ambulatory Visit: Payer: Self-pay | Admitting: Family Medicine

## 2017-11-26 ENCOUNTER — Other Ambulatory Visit: Payer: Self-pay | Admitting: Family Medicine

## 2017-12-07 ENCOUNTER — Ambulatory Visit: Payer: BC Managed Care – PPO

## 2018-02-12 ENCOUNTER — Other Ambulatory Visit: Payer: Self-pay | Admitting: *Deleted

## 2018-02-12 ENCOUNTER — Encounter: Payer: Self-pay | Admitting: Family Medicine

## 2018-02-12 ENCOUNTER — Ambulatory Visit: Payer: BC Managed Care – PPO | Admitting: Family Medicine

## 2018-02-12 VITALS — BP 119/73 | HR 81 | Ht 64.96 in | Wt 148.0 lb

## 2018-02-12 DIAGNOSIS — M797 Fibromyalgia: Secondary | ICD-10-CM

## 2018-02-12 DIAGNOSIS — G250 Essential tremor: Secondary | ICD-10-CM

## 2018-02-12 DIAGNOSIS — R809 Proteinuria, unspecified: Secondary | ICD-10-CM | POA: Diagnosis not present

## 2018-02-12 DIAGNOSIS — R110 Nausea: Secondary | ICD-10-CM | POA: Diagnosis not present

## 2018-02-12 LAB — POCT UA - MICROALBUMIN
CREATININE, POC: 300 mg/dL
Microalbumin Ur, POC: 80 mg/L

## 2018-02-12 LAB — COMPLETE METABOLIC PANEL WITH GFR
AG RATIO: 1.6 (calc) (ref 1.0–2.5)
ALKALINE PHOSPHATASE (APISO): 55 U/L (ref 33–130)
ALT: 27 U/L (ref 6–29)
AST: 19 U/L (ref 10–35)
Albumin: 3.7 g/dL (ref 3.6–5.1)
BILIRUBIN TOTAL: 0.3 mg/dL (ref 0.2–1.2)
BUN/Creatinine Ratio: 18 (calc) (ref 6–22)
BUN: 19 mg/dL (ref 7–25)
CALCIUM: 8.6 mg/dL (ref 8.6–10.4)
CHLORIDE: 111 mmol/L — AB (ref 98–110)
CO2: 20 mmol/L (ref 20–32)
Creat: 1.07 mg/dL — ABNORMAL HIGH (ref 0.50–1.05)
GFR, EST NON AFRICAN AMERICAN: 60 mL/min/{1.73_m2} (ref >=60)
GFR, Est African American: 70 mL/min/{1.73_m2} (ref >=60)
GLOBULIN: 2.3 g/dL (ref 1.9–3.7)
Glucose, Bld: 76 mg/dL (ref 65–99)
POTASSIUM: 4.4 mmol/L (ref 3.5–5.3)
SODIUM: 138 mmol/L (ref 135–146)
Total Protein: 6 g/dL — ABNORMAL LOW (ref 6.1–8.1)

## 2018-02-12 LAB — CBC WITH DIFFERENTIAL/PLATELET
BASOS ABS: 43 {cells}/uL (ref 0–200)
Basophils Relative: 0.5 %
Eosinophils Absolute: 306 cells/uL (ref 15–500)
Eosinophils Relative: 3.6 %
HEMATOCRIT: 37.5 % (ref 35.0–45.0)
Hemoglobin: 12.6 g/dL (ref 11.7–15.5)
LYMPHS ABS: 1828 {cells}/uL (ref 850–3900)
MCH: 29.9 pg (ref 27.0–33.0)
MCHC: 33.6 g/dL (ref 32.0–36.0)
MCV: 88.9 fL (ref 80.0–100.0)
MPV: 10.1 fL (ref 7.5–12.5)
Monocytes Relative: 9.8 %
NEUTROS PCT: 64.6 %
Neutro Abs: 5491 cells/uL (ref 1500–7800)
Platelets: 292 10*3/uL (ref 140–400)
RBC: 4.22 10*6/uL (ref 3.80–5.10)
RDW: 12.7 % (ref 11.0–15.0)
Total Lymphocyte: 21.5 %
WBC: 8.5 10*3/uL (ref 3.8–10.8)
WBCMIX: 833 {cells}/uL (ref 200–950)

## 2018-02-12 LAB — POCT URINALYSIS DIPSTICK
Glucose, UA: NEGATIVE
KETONES UA: NEGATIVE
Leukocytes, UA: NEGATIVE
Nitrite, UA: NEGATIVE
PH UA: 5.5 (ref 5.0–8.0)
Protein, UA: POSITIVE — AB
Spec Grav, UA: >=1.03 — AB (ref 1.010–1.025)
UROBILINOGEN UA: 0.2 U/dL

## 2018-02-12 LAB — LIPASE: LIPASE: 10 U/L (ref 7–60)

## 2018-02-12 NOTE — Progress Notes (Signed)
Subjective:    CC:   HPI:  52 year old female is here today for follow-up for her fibromyalgia and medication check.Currently on Cymbalta and uses flexeril PRN. Also on gabapentin. pt reports that she feels tired and has been nauseated lately in the middle of the night and sometimes during the day.  She has been struggling for the last 3 months with her symptoms. Just feeeling more tired and fatigued.    F/U essential tremor  - Doing well overall.   Doing well on propranolol.  Headaches-overall doing well.  They are under good control overall.  On Topamax and propranolol.  Using Imitrex for rescue.  He has been experiencing some nausea over the last 3 weeks.  She says she will wake up in the middle the night feeling extremely nauseated she has not vomited yet.  It usually lasts about an hour before she is able to go back to bed.  She denies any dietary changes.  No heartburn reflux or belching.  No recent changes in her bowels she does not feel constipated.  She denies any abdominal cramping or pain.  She denies any urinary symptoms.  Past medical history, Surgical history, Family history not pertinant except as noted below, Social history, Allergies, and medications have been entered into the medical record, reviewed, and corrections made.   Review of Systems: No fevers, chills, night sweats, weight loss, chest pain, or shortness of breath.   Objective:    General: Well Developed, well nourished, and in no acute distress.  Neuro: Alert and oriented x3, extra-ocular muscles intact, sensation grossly intact.  HEENT: Normocephalic, atraumatic  Skin: Warm and dry, no rashes. Cardiac: Regular rate and rhythm, no murmurs rubs or gallops, no lower extremity edema.  Respiratory: Clear to auscultation bilaterally. Not using accessory muscles, speaking in full sentences.   Impression and Recommendations:    Firbomyalgia -stable overall though she has been having more pain over the last 3 months.   Just continue with regimen of regular stretching and exercise.  Headaches -need Topamax and as needed Imitrex.  Essential tremor -doing well on propranolol.  Nausea-unclear etiology.  She does not have any pain but the nausea seems to be quite recurrent persistent in the middle the night last about an hour.  Could be her gallbladder.  Could be gastritis.  Recommend a trial of over-the-counter Prilosec or Nexium at bedtime for about 5 days.  If not improving then we can always get an ultrasound.  We will also do some blood work today just to check a CBC for infection as well as lipase for pancreatitis and liver enzymes for hepatitis.

## 2018-02-17 ENCOUNTER — Other Ambulatory Visit: Payer: Self-pay | Admitting: Family Medicine

## 2018-03-04 ENCOUNTER — Ambulatory Visit (INDEPENDENT_AMBULATORY_CARE_PROVIDER_SITE_OTHER): Payer: BC Managed Care – PPO | Admitting: Family Medicine

## 2018-03-04 ENCOUNTER — Encounter: Payer: Self-pay | Admitting: Family Medicine

## 2018-03-04 VITALS — BP 139/68 | HR 83 | Ht 65.0 in | Wt 148.0 lb

## 2018-03-04 DIAGNOSIS — R809 Proteinuria, unspecified: Secondary | ICD-10-CM | POA: Diagnosis not present

## 2018-03-04 DIAGNOSIS — J0100 Acute maxillary sinusitis, unspecified: Secondary | ICD-10-CM | POA: Diagnosis not present

## 2018-03-04 LAB — POCT UA - MICROALBUMIN
Creatinine, POC: 100 mg/dL
Microalbumin Ur, POC: 30 mg/L

## 2018-03-04 MED ORDER — AMOXICILLIN-POT CLAVULANATE 875-125 MG PO TABS: 1.0000 | ORAL_TABLET | Freq: Two times a day (BID) | ORAL | 0 refills | Status: DC

## 2018-03-04 NOTE — Progress Notes (Signed)
   Subjective:    Patient ID: Carol Diaz, female    DOB: 11-25-1965, 52 y.o.   MRN: [id number]  HPI 52 yo female comes in today c/o of HA and sinus pressure and congestion for approximately 3 days.  Says she has had a cold for the 2 weeks prior to that but in the last couple days is culminated into a severe headache in the front of her head and at the base of her head as well as sinus pressure that is worse over the left maxillary sinus.  She is been taking regular strength Tylenol.  She does have some facial pain and teeth pain.  No fevers chills or sweats. No cough.    He is also here today to follow-up for proteinuria.  We did a urinalysis about 3 and half weeks ago, she had a trace amount of protein between 30-300 with point-of-care microalbumin test.  Review of Systems     Objective:   Physical Exam  Constitutional: She is oriented to person, place, and time. She appears well-developed and well-nourished.  HENT:  Head: Normocephalic and atraumatic.  Right Ear: External ear normal.  Left Ear: External ear normal.  Nose: Nose normal.  Mouth/Throat: Oropharynx is clear and moist.  TMs and canals are clear.   Eyes: Pupils are equal, round, and reactive to light. Conjunctivae and EOM are normal.  Neck: Neck supple. No thyromegaly present.  Cardiovascular: Normal rate, regular rhythm and normal heart sounds.  Pulmonary/Chest: Effort normal and breath sounds normal. She has no wheezes.  Lymphadenopathy:    She has no cervical adenopathy.  Neurological: She is alert and oriented to person, place, and time.  Skin: Skin is warm and dry.  Psychiatric: She has a normal mood and affect.       Assessment & Plan:  Acute sinusitis-we will treat with Augmentin.  If not better in 1 week then please give us a call back.  Okay to continue symptomatic care.  Recommend nasal saline.  Proteinuria - seems to have resolved on todays repeat urine.

## 2018-03-06 ENCOUNTER — Encounter: Payer: Self-pay | Admitting: Family Medicine

## 2018-03-06 NOTE — Telephone Encounter (Signed)
Letter printed.

## 2018-03-10 ENCOUNTER — Other Ambulatory Visit: Payer: Self-pay | Admitting: Family Medicine

## 2018-03-15 ENCOUNTER — Encounter: Payer: Self-pay | Admitting: Family Medicine

## 2018-03-15 MED ORDER — CEFDINIR 300 MG PO CAPS: 300.0000 mg | ORAL_CAPSULE | Freq: Two times a day (BID) | ORAL | 0 refills | Status: DC

## 2018-03-28 ENCOUNTER — Other Ambulatory Visit: Payer: Self-pay | Admitting: Family Medicine

## 2018-04-04 ENCOUNTER — Telehealth: Payer: Self-pay

## 2018-04-04 NOTE — Telephone Encounter (Signed)
Mailed letter to home address.

## 2018-04-29 ENCOUNTER — Encounter: Payer: Self-pay | Admitting: Physician Assistant

## 2018-04-29 ENCOUNTER — Ambulatory Visit (INDEPENDENT_AMBULATORY_CARE_PROVIDER_SITE_OTHER): Payer: BC Managed Care – PPO | Admitting: Physician Assistant

## 2018-04-29 ENCOUNTER — Ambulatory Visit (INDEPENDENT_AMBULATORY_CARE_PROVIDER_SITE_OTHER): Payer: BC Managed Care – PPO

## 2018-04-29 VITALS — BP 133/84 | HR 75 | Temp 98.4°F | Wt 150.0 lb

## 2018-04-29 DIAGNOSIS — M545 Low back pain, unspecified: Secondary | ICD-10-CM

## 2018-04-29 DIAGNOSIS — R109 Unspecified abdominal pain: Secondary | ICD-10-CM

## 2018-04-29 DIAGNOSIS — R1032 Left lower quadrant pain: Secondary | ICD-10-CM

## 2018-04-29 DIAGNOSIS — R3129 Other microscopic hematuria: Secondary | ICD-10-CM

## 2018-04-29 LAB — CBC WITH DIFFERENTIAL/PLATELET
Absolute Monocytes: 664 cells/uL (ref 200–950)
BASOS ABS: 33 {cells}/uL (ref 0–200)
Basophils Relative: 0.4 %
EOS ABS: 266 {cells}/uL (ref 15–500)
EOS PCT: 3.2 %
HCT: 38.1 % (ref 35.0–45.0)
Hemoglobin: 12.9 g/dL (ref 11.7–15.5)
Lymphs Abs: 2241 cells/uL (ref 850–3900)
MCH: 30.6 pg (ref 27.0–33.0)
MCHC: 33.9 g/dL (ref 32.0–36.0)
MCV: 90.5 fL (ref 80.0–100.0)
MONOS PCT: 8 %
MPV: 10 fL (ref 7.5–12.5)
NEUTROS PCT: 61.4 %
Neutro Abs: 5096 cells/uL (ref 1500–7800)
Platelets: 317 10*3/uL (ref 140–400)
RBC: 4.21 10*6/uL (ref 3.80–5.10)
RDW: 12.8 % (ref 11.0–15.0)
Total Lymphocyte: 27 %
WBC: 8.3 10*3/uL (ref 3.8–10.8)

## 2018-04-29 LAB — POCT URINALYSIS DIPSTICK
Bilirubin, UA: NEGATIVE
Glucose, UA: NEGATIVE
KETONES UA: NEGATIVE
Leukocytes, UA: NEGATIVE
NITRITE UA: NEGATIVE
PROTEIN UA: POSITIVE — AB
SPEC GRAV UA: 1.02 (ref 1.010–1.025)
UROBILINOGEN UA: 0.2 U/dL
pH, UA: 5.5 (ref 5.0–8.0)

## 2018-04-29 LAB — BASIC METABOLIC PANEL WITH GFR
BUN: 15 mg/dL (ref 7–25)
CALCIUM: 8.6 mg/dL (ref 8.6–10.4)
CHLORIDE: 109 mmol/L (ref 98–110)
CO2: 22 mmol/L (ref 20–32)
Creat: 1 mg/dL (ref 0.50–1.05)
GFR, EST AFRICAN AMERICAN: 75 mL/min/{1.73_m2} (ref >=60)
GFR, EST NON AFRICAN AMERICAN: 65 mL/min/{1.73_m2} (ref >=60)
Glucose, Bld: 107 mg/dL — ABNORMAL HIGH (ref 65–99)
Potassium: 4 mmol/L (ref 3.5–5.3)
SODIUM: 138 mmol/L (ref 135–146)

## 2018-04-29 MED ORDER — KETOROLAC TROMETHAMINE 30 MG/ML IJ SOLN
30.0000 mg | Freq: Once | INTRAMUSCULAR | Status: AC
  Administered 2018-04-29: 30 mg via INTRAMUSCULAR

## 2018-04-29 MED ORDER — TRAMADOL HCL 50 MG PO TABS: 50.0000 mg | ORAL_TABLET | Freq: Four times a day (QID) | ORAL | 0 refills | Status: DC | PRN

## 2018-04-29 MED ORDER — CYCLOBENZAPRINE HCL 10 MG PO TABS: 5.0000 mg | ORAL_TABLET | Freq: Three times a day (TID) | ORAL | 1 refills | Status: DC | PRN

## 2018-04-29 NOTE — Patient Instructions (Signed)
Acute Back Pain, Adult  Acute back pain is sudden and usually short-lived. It is often caused by an injury to the muscles and tissues in the back. The injury may result from:   A muscle or ligament getting overstretched or torn (strained). Ligaments are tissues that connect bones to each other. Lifting something improperly can cause a back strain.   Wear and tear (degeneration) of the spinal disks. Spinal disks are circular tissue that provides cushioning between the bones of the spine (vertebrae).   Twisting motions, such as while playing sports or doing yard work.   A hit to the back.   Arthritis.  You may have a physical exam, lab tests, and imaging tests to find the cause of your pain. Acute back pain usually goes away with rest and home care.  Follow these instructions at home:  Managing pain, stiffness, and swelling   Take over-the-counter and prescription medicines only as told by your health care provider.   Your health care provider may recommend applying ice during the first 24-48 hours after your pain starts. To do this:  ? Put ice in a plastic bag.  ? Place a towel between your skin and the bag.  ? Leave the ice on for 20 minutes, 2-3 times a day.   If directed, apply heat to the affected area as often as told by your health care provider. Use the heat source that your health care provider recommends, such as a moist heat pack or a heating pad.  ? Place a towel between your skin and the heat source.  ? Leave the heat on for 20-30 minutes.  ? Remove the heat if your skin turns bright red. This is especially important if you are unable to feel pain, heat, or cold. You have a greater risk of getting burned.  Activity     Do not stay in bed. Staying in bed for more than 1-2 days can delay your recovery.   Sit up and stand up straight. Avoid leaning forward when you sit, or hunching over when you stand.  ? If you work at a desk, sit close to it so you do not need to lean over. Keep your chin tucked  in. Keep your neck drawn back, and keep your elbows bent at a right angle. Your arms should look like the letter "L."  ? Sit high and close to the steering wheel when you drive. Add lower back (lumbar) support to your car seat, if needed.   Take short walks on even surfaces as soon as you are able. Try to increase the length of time you walk each day.   Do not sit, drive, or stand in one place for more than 30 minutes at a time. Sitting or standing for long periods of time can put stress on your back.   Do not drive or use heavy machinery while taking prescription pain medicine.   Use proper lifting techniques. When you bend and lift, use positions that put less stress on your back:  ? Bend your knees.  ? Keep the load close to your body.  ? Avoid twisting.   Exercise regularly as told by your health care provider. Exercising helps your back heal faster and helps prevent back injuries by keeping muscles strong and flexible.   Work with a physical therapist to make a safe exercise program, as recommended by your health care provider. Do any exercises as told by your physical therapist.  Lifestyle   Maintain   a healthy weight. Extra weight puts stress on your back and makes it difficult to have good posture.   Avoid activities or situations that make you feel anxious or stressed. Stress and anxiety increase muscle tension and can make back pain worse. Learn ways to manage anxiety and stress, such as through exercise.  General instructions   Sleep on a firm mattress in a comfortable position. Try lying on your side with your knees slightly bent. If you lie on your back, put a pillow under your knees.   Follow your treatment plan as told by your health care provider. This may include:  ? Cognitive or behavioral therapy.  ? Acupuncture or massage therapy.  ? Meditation or yoga.  Contact a health care provider if:   You have pain that is not relieved with rest or medicine.   You have increasing pain going down  into your legs or buttocks.   Your pain does not improve after 2 weeks.   You have pain at night.   You lose weight without trying.   You have a fever or chills.  Get help right away if:   You develop new bowel or bladder control problems.   You have unusual weakness or numbness in your arms or legs.   You develop nausea or vomiting.   You develop abdominal pain.   You feel faint.  Summary   Acute back pain is sudden and usually short-lived.   Use proper lifting techniques. When you bend and lift, use positions that put less stress on your back.   Take over-the-counter and prescription medicines and apply heat or ice as directed by your health care provider.  This information is not intended to replace advice given to you by your health care provider. Make sure you discuss any questions you have with your health care provider.  Document Released: 03/13/2005 Document Revised: 10/18/2017 Document Reviewed: 10/25/2016  [employer] Interactive Patient Education  2019 [employer]

## 2018-04-29 NOTE — Addendum Note (Signed)
Addended by: Thom Chimes on: 04/29/2018 02:03 PM   Modules accepted: Orders

## 2018-04-29 NOTE — Progress Notes (Signed)
HPI:                                                                Carol Diaz is a 53 y.o. female who [facility] [place] Medicine today for low back pain  Pleasant 53 yo F with PMH of migraines, IBS, CKD, fibromyalgia and fatigue presents with left-sided low back pain radiating to LLQ, waxing and waning for approx 5 days. Associated with nausea and mildy increased urinary frequency, decreased appetite and malaise. Occasionally worse with ambulation, but otherwise non-positional. No prior hx of nephrolithiasis Denies dysuria, hematuria. Denies fever, chills, vomiting, change in bowel habits, melena/hematochezia. No saddle numbness, bowel/bladder dysfunction, radicular symptoms. Has tried heating pad with mild relief and peppermint suckers for nausea which have been helpful.  Past Medical History:  Diagnosis Date  . Chronic fatigue syndrome   . Hypertension   . Migraines   . Palpitations    Past Surgical History:  Procedure Laterality Date  . CESAREAN SECTION    . ivp    . LAPAROSCOPIC APPENDECTOMY N/A 05/17/2017   Procedure: DIAGNOSTIC LAPAROSCOPY APPENDECTOMY;  Surgeon: Romie Leveehomas, Alicia, MD;  Location: [facility];  Service: General;  Laterality: N/A;  . TUBAL LIGATION     Social History   Tobacco Use  . Smoking status: Never Smoker  . Smokeless tobacco: Never Used  Substance Use Topics  . Alcohol use: No   family history includes Emphysema in her mother; Heart disease (age of onset: 5950) in her brother; Hypertension in her father.    ROS: Review of Systems  Constitutional: Positive for malaise/fatigue. Negative for chills, diaphoresis and fever.  Gastrointestinal: Positive for abdominal pain and nausea. Negative for blood in stool, constipation, diarrhea and melena.  Musculoskeletal: Positive for back pain.  Neurological: Positive for tremors. Negative for sensory change and focal weakness.     Medications: Current Outpatient  Medications  Medication Sig Dispense Refill  . acetaminophen (TYLENOL) 325 MG tablet Two tablets every 4 hours as needed for pain.  You can buy over the counter at any drug store.    Marland Kitchen. atorvastatin (LIPITOR) 20 MG tablet Take 1 tablet (20 mg total) by mouth daily. 90 tablet 3  . cyclobenzaprine (FLEXERIL) 10 MG tablet Take 0.5-1 tablets (5-10 mg total) by mouth 3 (three) times daily as needed for muscle spasms. 45 tablet 1  . fluticasone (FLONASE) 50 MCG/ACT nasal spray One spray in each nostril twice a day, use left hand for right nostril, and right hand for left nostril. 48 g 3  . INTROVALE 0.15-0.03 MG tablet TAKE 1 TABLET BY MOUTH DAILY 91 tablet 3  . LINZESS 290 MCG CAPS capsule TAKE 1 CAPSULE BY MOUTH DAILY 30 MINUTES BEFORE FIRST MEAL OF THE DAY 90 capsule 1  . metFORMIN (GLUCOPHAGE) 500 MG tablet TAKE 1 TABLET(500 MG) BY MOUTH TWICE DAILY WITH A MEAL 180 tablet 1  . nabumetone (RELAFEN) 500 MG tablet TAKE 1 TABLET(500 MG) BY MOUTH AT BEDTIME AS NEEDED 90 tablet 0  . propranolol ER (INDERAL LA) 60 MG 24 hr capsule TAKE 1 CAPSULE BY MOUTH DAILY 90 capsule 1  . SUMAtriptan (IMITREX) 100 MG tablet TAKE 1 TABLET BY MOUTH EVERY 2 HOURS IF NEEDED FOR MIGRAINES AS DIRECTED  10 tablet 0  . topiramate (TOPAMAX) 50 MG tablet TAKE 1 TABLET(50 MG) BY MOUTH AT BEDTIME 90 tablet 3  . traMADol (ULTRAM) 50 MG tablet Take 1 tablet (50 mg total) by mouth every 6 (six) hours as needed for up to 5 days for moderate pain or severe pain. Maximum 6 tabs per day. 20 tablet 0   No current facility-administered medications for this visit.    Allergies  Allergen Reactions  . Fluoxetine Other (See Comments)    Made her tremor worse   . Lexapro [Escitalopram Oxalate] Other (See Comments)    Headache  . Morphine Nausea And Vomiting  . Sertraline Other (See Comments)    Headache         Objective:  BP 133/84   Pulse 75   Temp 98.4 F (36.9 C) (Oral)   Wt 150 lb (68 kg)   LMP 04/22/2018 (Exact Date)    SpO2 100%   BMI 24.96 kg/m  Gen:  alert, not ill-appearing, no distress, appropriate for age HEENT: head normocephalic without obvious abnormality, conjunctiva and cornea clear, trachea midline Pulm: Normal work of breathing, normal phonation, clear to auscultation bilaterally, no wheezes, rales or rhonchi CV: Normal rate, regular rhythm, s1 and s2 distinct, no murmurs, clicks or rubs  GI: abdomen soft, non-tender, no guarding or rigidity, no CVA tenderness Neuro: alert and oriented x 3, resting tremor MSK: extremities atraumatic, normal gait and station Skin: intact, no rashes on exposed skin, no jaundice, no cyanosis Psych: well-groomed, cooperative, good eye contact, euthymic mood, flat affect, speech is articulate, and thought processes clear and goal-directed  Lab Results  Component Value Date   CREATININE 1.07 (H) 02/12/2018   BUN 19 02/12/2018   NA 138 02/12/2018   K 4.4 02/12/2018   CL 111 (H) 02/12/2018   CO2 20 02/12/2018     Results for orders placed or performed in visit on 04/29/18 (from the past 72 hour(s))  POCT Urinalysis Dipstick     Status: Abnormal   Collection Time: 04/29/18 10:39 AM  Result Value Ref Range   Color, UA yellow    Clarity, UA cloudy    Glucose, UA Negative Negative   Bilirubin, UA negative    Ketones, UA negative    Spec Grav, UA 1.020 1.010 - 1.025   Blood, UA moderate    pH, UA 5.5 5.0 - 8.0   Protein, UA Positive (A) Negative   Urobilinogen, UA 0.2 0.2 or 1.0 E.U./dL   Nitrite, UA negative    Leukocytes, UA Negative Negative   Appearance     Odor    CBC with Differential/Platelet     Status: None   Collection Time: 04/29/18 10:54 AM  Result Value Ref Range   WBC 8.3 3.8 - 10.8 Thousand/uL   RBC 4.21 3.80 - 5.10 Million/uL   Hemoglobin 12.9 11.7 - 15.5 g/dL   HCT 16.138.1 09.635.0 - 04.545.0 %   MCV 90.5 80.0 - 100.0 fL   MCH 30.6 27.0 - 33.0 pg   MCHC 33.9 32.0 - 36.0 g/dL   RDW 40.912.8 81.111.0 - 91.415.0 %   Platelets 317 140 - 400 Thousand/uL    MPV 10.0 7.5 - 12.5 fL   Neutro Abs 5,096 1,500 - 7,800 cells/uL   Lymphs Abs 2,241 850 - 3,900 cells/uL   Absolute Monocytes 664 200 - 950 cells/uL   Eosinophils Absolute 266 15 - 500 cells/uL   Basophils Absolute 33 0 - 200 cells/uL   Neutrophils Relative %  61.4 %   Total Lymphocyte 27.0 %   Monocytes Relative 8.0 %   Eosinophils Relative 3.2 %   Basophils Relative 0.4 %   Ct Renal Stone Study  Result Date: 04/29/2018 CLINICAL DATA:  Left flank pain.  Microscopic hematuria. EXAM: CT ABDOMEN AND PELVIS WITHOUT CONTRAST TECHNIQUE: Multidetector CT imaging of the abdomen and pelvis was performed following the standard protocol without IV contrast. COMPARISON:  CT abdomen pelvis dated May 15, 2017. FINDINGS: Lower chest: No acute abnormality. Hepatobiliary: No focal liver abnormality is seen. No gallstones, gallbladder wall thickening, or biliary dilatation. Pancreas: Unremarkable. No pancreatic ductal dilatation or surrounding inflammatory changes. Spleen: Normal in size without focal abnormality. Adrenals/Urinary Tract: Adrenal glands are unremarkable. Kidneys are normal, without renal calculi, focal lesion, or hydronephrosis. Bladder is decompressed. Stomach/Bowel: Stomach is within normal limits. Appendix is surgically absent. No evidence of bowel wall thickening, distention, or inflammatory changes. Vascular/Lymphatic: No significant vascular findings are present. No enlarged abdominal or pelvic lymph nodes. Reproductive: Uterus and bilateral adnexa are unremarkable. Other: Unchanged tiny fat containing umbilical hernia. No free fluid or pneumoperitoneum. Musculoskeletal: No acute or significant osseous findings. IMPRESSION: 1.  No acute intra-abdominal process.  No urolithiasis. Electronically Signed   By: Obie Dredge M.D.   On: 04/29/2018 11:10      Assessment and Plan: 52 y.o. female with  .Adejah was seen today for back pain and abdominal pain.  Diagnoses and all orders for this  visit:  Microscopic hematuria -     CT RENAL STONE STUDY -     Urine Culture  Left flank pain -     POCT Urinalysis Dipstick -     CT RENAL STONE STUDY -     Urine Culture  Left lower quadrant abdominal pain -     CT RENAL STONE STUDY -     BASIC METABOLIC PANEL WITH GFR -     CBC with Differential/Platelet -     Urine Culture  Acute left-sided low back pain without sciatica -     traMADol (ULTRAM) 50 MG tablet; Take 1 tablet (50 mg total) by mouth every 6 (six) hours as needed for up to 5 days for moderate pain or severe pain. Maximum 6 tabs per day. -     cyclobenzaprine (FLEXERIL) 10 MG tablet; Take 0.5-1 tablets (5-10 mg total) by mouth 3 (three) times daily as needed for muscle spasms.   Afebrile, no tachypnea, no tachycardia, benign abdominal exam POC UA positive for moderate blood Non-mechanical LBP radiating to LLQ associated with urinary frequency and microscopic hematuria CT stone study ordered today, negative for acute process Of note, CT abdomen/pelvis 04/2017 was negative for nephrolithiasis or urinary tract abnormality and patient has hx of chronic microscopic hematuria dating back to 2014 BMP and CBC w/diff pending  Toradol 30 mg given in office today. Counseled to avoid NSAIDs Refill of Flexeril given today. Instructed to take as needed for back pain. Do not combine with Tramadol. If still having moderate pain after muscle relaxer, may take Tramadol at least 2 hours later  Patient education and anticipatory guidance given Patient agrees with treatment plan Follow-up with PCP in 2-3 days or sooner as needed if symptoms worsen or fail to improve  Levonne Hubert PA-C

## 2018-05-01 LAB — URINE CULTURE
MICRO NUMBER:: 142935
SPECIMEN QUALITY:: ADEQUATE

## 2018-05-02 ENCOUNTER — Ambulatory Visit: Payer: BC Managed Care – PPO | Admitting: Family Medicine

## 2018-05-02 NOTE — Progress Notes (Deleted)
Established Patient Office Visit  Subjective:  Patient ID: Carol Diaz, female    DOB: 11/01/1965  Age: 53 y.o. MRN: [id number]  CC: No chief complaint on file.   HPI Carol Diaz presents for left-sided back pain.  She presented about 3 days ago with left flank pain that was radiating into the left lower quadrant.  She had had some nausea with it and some increased urinary frequency.  He had a CT renal study which was negative for kidney stones.  Kidney function was stable and urine culture came back negative.  CLINICAL DATA:  Left flank pain.  Microscopic hematuria.  EXAM: CT ABDOMEN AND PELVIS WITHOUT CONTRAST  TECHNIQUE: Multidetector CT imaging of the abdomen and pelvis was performed following the standard protocol without IV contrast.  COMPARISON:  CT abdomen pelvis dated May 15, 2017.  FINDINGS: Lower chest: No acute abnormality.  Hepatobiliary: No focal liver abnormality is seen. No gallstones, gallbladder wall thickening, or biliary dilatation.  Pancreas: Unremarkable. No pancreatic ductal dilatation or surrounding inflammatory changes.  Spleen: Normal in size without focal abnormality.  Adrenals/Urinary Tract: Adrenal glands are unremarkable. Kidneys are normal, without renal calculi, focal lesion, or hydronephrosis. Bladder is decompressed.  Stomach/Bowel: Stomach is within normal limits. Appendix is surgically absent. No evidence of bowel wall thickening, distention, or inflammatory changes.  Vascular/Lymphatic: No significant vascular findings are present. No enlarged abdominal or pelvic lymph nodes.  Reproductive: Uterus and bilateral adnexa are unremarkable.  Other: Unchanged tiny fat containing umbilical hernia. No free fluid or pneumoperitoneum.  Musculoskeletal: No acute or significant osseous findings.  IMPRESSION: 1.  No acute intra-abdominal process.  No urolithiasis.  Past Medical History:  Diagnosis Date  .  Chronic fatigue syndrome   . Hypertension   . Migraines   . Palpitations     Past Surgical History:  Procedure Laterality Date  . CESAREAN SECTION    . ivp    . LAPAROSCOPIC APPENDECTOMY N/A 05/17/2017   Procedure: DIAGNOSTIC LAPAROSCOPY APPENDECTOMY;  Surgeon: Romie Levee, MD;  Location: [facility];  Service: General;  Laterality: N/A;  . TUBAL LIGATION      Family History  Problem Relation Age of Onset  . Emphysema Mother   . Hypertension Father   . Heart disease Brother 53       mi    Social History   Socioeconomic History  . Marital status: Married    Spouse name: Not on file  . Number of children: 2  . Years of education: Not on file  . Highest education level: Not on file  Occupational History    Employer: [employer]  Social Needs  . Financial resource strain: Not on file  . Food insecurity:    Worry: Not on file    Inability: Not on file  . Transportation needs:    Medical: Not on file    Non-medical: Not on file  Tobacco Use  . Smoking status: Never Smoker  . Smokeless tobacco: Never Used  Substance and Sexual Activity  . Alcohol use: No  . Drug use: No  . Sexual activity: Yes    Partners: Male    Birth control/protection: Pill  Lifestyle  . Physical activity:    Days per week: Not on file    Minutes per session: Not on file  . Stress: Not on file  Relationships  . Social connections:    Talks on phone: Not on file    Gets together: Not on file  Attends religious service: Not on file    Active member of club or organization: Not on file    Attends meetings of clubs or organizations: Not on file    Relationship status: Not on file  . Intimate partner violence:    Fear of current or ex partner: Not on file    Emotionally abused: Not on file    Physically abused: Not on file    Forced sexual activity: Not on file  Other Topics Concern  . Not on file  Social History Narrative   Some exercise.     Outpatient Medications Prior  to Visit  Medication Sig Dispense Refill  . acetaminophen (TYLENOL) 325 MG tablet Two tablets every 4 hours as needed for pain.  You can buy over the counter at any drug store.    Marland Kitchen atorvastatin (LIPITOR) 20 MG tablet Take 1 tablet (20 mg total) by mouth daily. 90 tablet 3  . cyclobenzaprine (FLEXERIL) 10 MG tablet Take 0.5-1 tablets (5-10 mg total) by mouth 3 (three) times daily as needed for muscle spasms. 45 tablet 1  . fluticasone (FLONASE) 50 MCG/ACT nasal spray One spray in each nostril twice a day, use left hand for right nostril, and right hand for left nostril. 48 g 3  . INTROVALE 0.15-0.03 MG tablet TAKE 1 TABLET BY MOUTH DAILY 91 tablet 3  . LINZESS 290 MCG CAPS capsule TAKE 1 CAPSULE BY MOUTH DAILY 30 MINUTES BEFORE FIRST MEAL OF THE DAY 90 capsule 1  . metFORMIN (GLUCOPHAGE) 500 MG tablet TAKE 1 TABLET(500 MG) BY MOUTH TWICE DAILY WITH A MEAL 180 tablet 1  . nabumetone (RELAFEN) 500 MG tablet TAKE 1 TABLET(500 MG) BY MOUTH AT BEDTIME AS NEEDED 90 tablet 0  . propranolol ER (INDERAL LA) 60 MG 24 hr capsule TAKE 1 CAPSULE BY MOUTH DAILY 90 capsule 1  . SUMAtriptan (IMITREX) 100 MG tablet TAKE 1 TABLET BY MOUTH EVERY 2 HOURS IF NEEDED FOR MIGRAINES AS DIRECTED 10 tablet 0  . topiramate (TOPAMAX) 50 MG tablet TAKE 1 TABLET(50 MG) BY MOUTH AT BEDTIME 90 tablet 3  . traMADol (ULTRAM) 50 MG tablet Take 1 tablet (50 mg total) by mouth every 6 (six) hours as needed for up to 5 days for moderate pain or severe pain. Maximum 6 tabs per day. 20 tablet 0   No facility-administered medications prior to visit.     Allergies  Allergen Reactions  . Fluoxetine Other (See Comments)    Made her tremor worse   . Lexapro [Escitalopram Oxalate] Other (See Comments)    Headache  . Morphine Nausea And Vomiting  . Sertraline Other (See Comments)    Headache     ROS Review of Systems    Objective:    Physical Exam  LMP 04/22/2018 (Exact Date)  Wt Readings from Last 3 Encounters:  04/29/18  150 lb (68 kg)  03/04/18 148 lb (67.1 kg)  02/12/18 148 lb (67.1 kg)     Health Maintenance Due  Topic Date Due  . HIV Screening  03/21/1981  . MAMMOGRAM  04/27/2016    There are no preventive care reminders to display for this patient.  Lab Results  Component Value Date   TSH 1.60 06/07/2015   Lab Results  Component Value Date   WBC 8.3 04/29/2018   HGB 12.9 04/29/2018   HCT 38.1 04/29/2018   MCV 90.5 04/29/2018   PLT 317 04/29/2018   Lab Results  Component Value Date   NA 138 04/29/2018  K 4.0 04/29/2018   CO2 22 04/29/2018   GLUCOSE 107 (H) 04/29/2018   BUN 15 04/29/2018   CREATININE 1.00 04/29/2018   BILITOT 0.3 02/12/2018   ALKPHOS 51 09/24/2015   AST 19 02/12/2018   ALT 27 02/12/2018   PROT 6.0 (L) 02/12/2018   ALBUMIN 3.9 09/24/2015   CALCIUM 8.6 04/29/2018   Lab Results  Component Value Date   CHOL 265 (H) 08/24/2017   Lab Results  Component Value Date   HDL 47 (L) 08/24/2017   Lab Results  Component Value Date   LDLCALC 183 (H) 08/24/2017   Lab Results  Component Value Date   TRIG 193 (H) 08/24/2017   Lab Results  Component Value Date   CHOLHDL 5.6 (H) 08/24/2017   Lab Results  Component Value Date   HGBA1C 5.3 01/01/2014      Assessment & Plan:   Problem List Items Addressed This Visit    None      No orders of the defined types were placed in this encounter.   Follow-up: No follow-ups on file.    Nani Gasseratherine Talen Poser, MD

## 2018-05-03 ENCOUNTER — Encounter: Payer: Self-pay | Admitting: Family Medicine

## 2018-05-03 ENCOUNTER — Ambulatory Visit: Payer: BC Managed Care – PPO | Admitting: Family Medicine

## 2018-05-03 ENCOUNTER — Telehealth: Payer: Self-pay | Admitting: Family Medicine

## 2018-05-03 VITALS — BP 111/65 | HR 86 | Temp 98.2°F | Ht 65.0 in | Wt 151.0 lb

## 2018-05-03 DIAGNOSIS — M545 Low back pain, unspecified: Secondary | ICD-10-CM

## 2018-05-03 DIAGNOSIS — J0100 Acute maxillary sinusitis, unspecified: Secondary | ICD-10-CM | POA: Diagnosis not present

## 2018-05-03 DIAGNOSIS — R319 Hematuria, unspecified: Secondary | ICD-10-CM

## 2018-05-03 DIAGNOSIS — M7062 Trochanteric bursitis, left hip: Secondary | ICD-10-CM | POA: Diagnosis not present

## 2018-05-03 LAB — POCT URINALYSIS DIPSTICK
Glucose, UA: NEGATIVE
Leukocytes, UA: NEGATIVE
Nitrite, UA: NEGATIVE
Protein, UA: POSITIVE — AB
Spec Grav, UA: >=1.03 — AB (ref 1.010–1.025)
Urobilinogen, UA: 0.2 E.U./dL
pH, UA: 5.5 (ref 5.0–8.0)

## 2018-05-03 MED ORDER — AMOXICILLIN-POT CLAVULANATE 875-125 MG PO TABS: 1.0000 | ORAL_TABLET | Freq: Two times a day (BID) | ORAL | 0 refills | Status: DC

## 2018-05-03 NOTE — Telephone Encounter (Signed)
Error

## 2018-05-03 NOTE — Patient Instructions (Signed)
Ice the area for about 5 minutes 3-4 times today.  Again tomorrow if needed. Recommend heat for your back. Follow the instructions for the exercises given. So recommend 2 tabs of Aleve twice a day or can use ibuprofen, 4 tabs taken 3 times a day for pain relief and inflammation.

## 2018-05-03 NOTE — Progress Notes (Signed)
Acute Office Visit  Subjective:    Patient ID: Carol Diaz, female    DOB: [birth year], 53 y.o.   MRN: [id number]  Chief Complaint  Patient presents with  . Hematuria  . Sinusitis    x 1 wk she reports that it began as a cold. she now has facial pain and pressure, denies f/s/c     HPI Patient is in today for left sided back pain.  She was actually seen in our office approximately 4 days ago on the third with left-sided low back pain radiating to the left lower quadrant.  It was waxing and waning for about 5 days.  She had some nausea with it and some urinary frequency.  Urine culture was negative for infection and CT was negative for kidney stones.  She still having left low back pain.  It is worse with walking even just walking across the parking lot makes it worse.  She denies any numbness or tingling down the left leg.  She says that she noticed that her left outer hip looked a little swollen and it feels very tender in fact she cannot lay on it at night.  She has had bursitis in that hip before.  She also reports upper respiratory symptoms for about 8 days, since last Friday.  She is had nasal congestion and sinus pressure.  She thought initially it was just a cold but it does not seem to be going away.  She is been using Sudafed and Tylenol for relief. bilat ear soreness.  She is having mostly bilateral facial cheek pain and radiating into her teeth.  She says that her face feels like it is throbbing when she bends over.    Past Medical History:  Diagnosis Date  . Chronic fatigue syndrome   . Hypertension   . Migraines   . Palpitations     Past Surgical History:  Procedure Laterality Date  . CESAREAN SECTION    . ivp    . LAPAROSCOPIC APPENDECTOMY N/A 05/17/2017   Procedure: DIAGNOSTIC LAPAROSCOPY APPENDECTOMY;  Surgeon: Romie Levee, MD;  Location: [facility];  Service: General;  Laterality: N/A;  . TUBAL LIGATION      Family History  Problem Relation Age of Onset  .  Emphysema Mother   . Hypertension Father   . Heart disease Brother 1       mi    Social History   Socioeconomic History  . Marital status: Married    Spouse name: Not on file  . Number of children: 2  . Years of education: Not on file  . Highest education level: Not on file  Occupational History    Employer: [employer]  Social Needs  . Financial resource strain: Not on file  . Food insecurity:    Worry: Not on file    Inability: Not on file  . Transportation needs:    Medical: Not on file    Non-medical: Not on file  Tobacco Use  . Smoking status: Never Smoker  . Smokeless tobacco: Never Used  Substance and Sexual Activity  . Alcohol use: No  . Drug use: No  . Sexual activity: Yes    Partners: Male    Birth control/protection: Pill  Lifestyle  . Physical activity:    Days per week: Not on file    Minutes per session: Not on file  . Stress: Not on file  Relationships  . Social connections:    Talks on phone: Not on  file    Gets together: Not on file    Attends religious service: Not on file    Active member of club or organization: Not on file    Attends meetings of clubs or organizations: Not on file    Relationship status: Not on file  . Intimate partner violence:    Fear of current or ex partner: Not on file    Emotionally abused: Not on file    Physically abused: Not on file    Forced sexual activity: Not on file  Other Topics Concern  . Not on file  Social History Narrative   Some exercise.     Outpatient Medications Prior to Visit  Medication Sig Dispense Refill  . acetaminophen (TYLENOL) 325 MG tablet Two tablets every 4 hours as needed for pain.  You can buy over the counter at any drug store.    . cyclobenzaprine (FLEXERIL) 10 MG tablet Take 0.5-1 tablets (5-10 mg total) by mouth 3 (three) times daily as needed for muscle spasms. 45 tablet 1  . fluticasone (FLONASE) 50 MCG/ACT nasal spray One spray in each nostril twice a day, use  left hand for right nostril, and right hand for left nostril. 48 g 3  . INTROVALE 0.15-0.03 MG tablet TAKE 1 TABLET BY MOUTH DAILY 91 tablet 3  . LINZESS 290 MCG CAPS capsule TAKE 1 CAPSULE BY MOUTH DAILY 30 MINUTES BEFORE FIRST MEAL OF THE DAY 90 capsule 1  . metFORMIN (GLUCOPHAGE) 500 MG tablet TAKE 1 TABLET(500 MG) BY MOUTH TWICE DAILY WITH A MEAL 180 tablet 1  . nabumetone (RELAFEN) 500 MG tablet TAKE 1 TABLET(500 MG) BY MOUTH AT BEDTIME AS NEEDED 90 tablet 0  . propranolol ER (INDERAL LA) 60 MG 24 hr capsule TAKE 1 CAPSULE BY MOUTH DAILY 90 capsule 1  . SUMAtriptan (IMITREX) 100 MG tablet TAKE 1 TABLET BY MOUTH EVERY 2 HOURS IF NEEDED FOR MIGRAINES AS DIRECTED 10 tablet 0  . topiramate (TOPAMAX) 50 MG tablet TAKE 1 TABLET(50 MG) BY MOUTH AT BEDTIME 90 tablet 3  . traMADol (ULTRAM) 50 MG tablet Take 1 tablet (50 mg total) by mouth every 6 (six) hours as needed for up to 5 days for moderate pain or severe pain. Maximum 6 tabs per day. 20 tablet 0  . atorvastatin (LIPITOR) 20 MG tablet Take 1 tablet (20 mg total) by mouth daily. 90 tablet 3   No facility-administered medications prior to visit.     Allergies  Allergen Reactions  . Fluoxetine Other (See Comments)    Made her tremor worse   . Lexapro [Escitalopram Oxalate] Other (See Comments)    Headache  . Morphine Nausea And Vomiting  . Sertraline Other (See Comments)    Headache     ROS     Objective:    Physical Exam  Constitutional: She is oriented to person, place, and time. She appears well-developed and well-nourished.  HENT:  Head: Normocephalic and atraumatic.  Right Ear: External ear normal.  Left Ear: External ear normal.  Nose: Nose normal.  Mouth/Throat: Oropharynx is clear and moist.  TMs and canals are clear.   Eyes: Pupils are equal, round, and reactive to light. Conjunctivae and EOM are normal.  Neck: Neck supple. No thyromegaly present.  Cardiovascular: Normal rate, regular rhythm and normal heart  sounds.  Pulmonary/Chest: Effort normal and breath sounds normal. She has no wheezes.  Musculoskeletal:     Comments: Normal lumbar flexion and extension though she did have pain with  flexion and extension.  Normal rotation right and left with no significant discomfort or pain.  Normal side bending.  Hip, knee, ankle strength is 5-5 bilaterally.  Patellar reflexes 1+ bilaterally.  She did have pain in the low back area with straight leg raise on the left side.  She is extremely tender over the left greater trochanteric bursa.  And she is also tender over the left low back and especially around the SI joint.  Tender over the lumbar spine itself.  Nontender over the right low back.  Lymphadenopathy:    She has no cervical adenopathy.  Neurological: She is alert and oriented to person, place, and time.  Skin: Skin is warm and dry.  Psychiatric: She has a normal mood and affect.    BP 111/65   Pulse 86   Temp 98.2 F (36.8 C)   Ht 5\' 5"  (1.651 m)   Wt 151 lb (68.5 kg)   LMP 04/22/2018 (Exact Date)   SpO2 100%   BMI 25.13 kg/m  Wt Readings from Last 3 Encounters:  05/03/18 151 lb (68.5 kg)  04/29/18 150 lb (68 kg)  03/04/18 148 lb (67.1 kg)    Health Maintenance Due  Topic Date Due  . HIV Screening  03/21/1981  . MAMMOGRAM  04/27/2016    There are no preventive care reminders to display for this patient.   Lab Results  Component Value Date   TSH 1.60 06/07/2015   Lab Results  Component Value Date   WBC 8.3 04/29/2018   HGB 12.9 04/29/2018   HCT 38.1 04/29/2018   MCV 90.5 04/29/2018   PLT 317 04/29/2018   Lab Results  Component Value Date   NA 138 04/29/2018   K 4.0 04/29/2018   CO2 22 04/29/2018   GLUCOSE 107 (H) 04/29/2018   BUN 15 04/29/2018   CREATININE 1.00 04/29/2018   BILITOT 0.3 02/12/2018   ALKPHOS 51 09/24/2015   AST 19 02/12/2018   ALT 27 02/12/2018   PROT 6.0 (L) 02/12/2018   ALBUMIN 3.9 09/24/2015   CALCIUM 8.6 04/29/2018   Lab Results   Component Value Date   CHOL 265 (H) 08/24/2017   Lab Results  Component Value Date   HDL 47 (L) 08/24/2017   Lab Results  Component Value Date   LDLCALC 183 (H) 08/24/2017   Lab Results  Component Value Date   TRIG 193 (H) 08/24/2017   Lab Results  Component Value Date   CHOLHDL 5.6 (H) 08/24/2017   Lab Results  Component Value Date   HGBA1C 5.3 01/01/2014       Assessment & Plan:   Problem List Items Addressed This Visit    None    Visit Diagnoses    Hematuria, unspecified type    -  Primary   Relevant Orders   POCT urinalysis dipstick (Completed)   Urinalysis, microscopic only   Acute left-sided low back pain without sciatica       Greater trochanteric bursitis, left       Acute non-recurrent maxillary sinusitis       Relevant Medications   amoxicillin-clavulanate (AUGMENTIN) 875-125 MG tablet     Sinusitis-we will treat with Augmentin.  If not better in 1 week then please give [place] a call back.  Okay to continue with symptomatic care.  Left low back pain-I really feel like it is musculoskeletal.  She had a negative urine culture and negative CT renal stone study protocol.  We discussed anti-inflammatories and muscle relaxers in  addition to heat or ice whichever feels better.  Left greater trochanteric bursitis-discussed options.  We will do a bursa injection today for more acute pain relief and then give her handout on stretches to do on her own for bursitis.  Bursa  Injection Procedure Note  Pre-operative Diagnosis: left Trochanteric bursitis  Post-operative Diagnosis: same  Indications: Diagnosis and treatment of symptomatic bursal effusion  Anesthesia: Lidocaine 1% with epinephrine without added sodium bicarbonate  Procedure Details   After a discussion of the risks and benefits with the patient (including the possibility that any manipulation of the bursa could introduce infection, worsening the current situation significantly), verbal consent was  obtained for the procedure. The joint was prepped with chlorhexidine and cold spray used . An 18 gauge needle was introduced into the fluid collection and nofluid was removed. After the fluid was removed, 1 ml of triamcinolone (KENALOG) 40mg /ml was injected into the bursa with 9 ml of 1% lidocain.  The injection site was cleansed with topical isopropyl alcohol and a dressing was applied.  Complications:  None; patient tolerated the procedure well.   Meds ordered this encounter  Medications  . amoxicillin-clavulanate (AUGMENTIN) 875-125 MG tablet    Sig: Take 1 tablet by mouth 2 (two) times daily.    Dispense:  14 tablet    Refill:  0     Nani Gasseratherine Gustavo Meditz, MD

## 2018-05-04 LAB — URINALYSIS, MICROSCOPIC ONLY: Hyaline Cast: NONE SEEN /LPF

## 2018-05-07 ENCOUNTER — Other Ambulatory Visit: Payer: Self-pay | Admitting: *Deleted

## 2018-05-07 ENCOUNTER — Ambulatory Visit: Payer: BC Managed Care – PPO | Admitting: Family Medicine

## 2018-05-07 DIAGNOSIS — R3129 Other microscopic hematuria: Secondary | ICD-10-CM

## 2018-05-11 ENCOUNTER — Other Ambulatory Visit: Payer: Self-pay | Admitting: Family Medicine

## 2018-05-15 ENCOUNTER — Encounter: Payer: Self-pay | Admitting: Family Medicine

## 2018-05-15 ENCOUNTER — Ambulatory Visit (INDEPENDENT_AMBULATORY_CARE_PROVIDER_SITE_OTHER): Payer: BC Managed Care – PPO | Admitting: Family Medicine

## 2018-05-15 VITALS — BP 132/79 | HR 74 | Ht 65.0 in | Wt 150.0 lb

## 2018-05-15 DIAGNOSIS — M7062 Trochanteric bursitis, left hip: Secondary | ICD-10-CM

## 2018-05-15 DIAGNOSIS — G43901 Migraine, unspecified, not intractable, with status migrainosus: Secondary | ICD-10-CM

## 2018-05-15 DIAGNOSIS — M797 Fibromyalgia: Secondary | ICD-10-CM | POA: Diagnosis not present

## 2018-05-15 DIAGNOSIS — G250 Essential tremor: Secondary | ICD-10-CM

## 2018-05-15 MED ORDER — KETOROLAC TROMETHAMINE 60 MG/2ML IM SOLN
60.0000 mg | Freq: Once | INTRAMUSCULAR | Status: AC
  Administered 2018-05-15: 60 mg via INTRAMUSCULAR

## 2018-05-15 MED ORDER — PREDNISONE 10 MG PO TABS: ORAL_TABLET | ORAL | 0 refills | Status: AC

## 2018-05-15 NOTE — Patient Instructions (Signed)
If the headaches are still bad after the weekend or if you feel like you are still having a lot of congestion or sinus pressure in your face after the weekend then please give [place] a call back.

## 2018-05-15 NOTE — Addendum Note (Signed)
Addended by: Deno Etienne on: 05/15/2018 09:41 AM   Modules accepted: Orders

## 2018-05-15 NOTE — Progress Notes (Signed)
Subjective:    CC:   HPI:  Follow-up fibromyalgia -doing okay overall no recent flares or exacerbations though she did come in with left trochanteric bursitis.  We did a steroid injection and she says it has made a huge difference in her mobility and pain relief.  Tremors-currently on propranolol for tremor control.  She is happy with the propranolol she still gets some tremor especially in the left hand but feels like a propranolol is helpful.  When I saw her couple weeks ago we diagnosed her with sinusitis in which she was treated with Augmentin.  He is feeling better overall still little bit of nasal congestion.  But now she has been having headaches. Reports that she is been having some headaches at the base of her head for about the last 3 days.  He is waking up with them every morning she will take her Imitrex and then get relief most of the day until about 5 or 6:00 in the evening and then it comes right back.  Past medical history, Surgical history, Family history not pertinant except as noted below, Social history, Allergies, and medications have been entered into the medical record, reviewed, and corrections made.   Review of Systems: No fevers, chills, night sweats, weight loss, chest pain, or shortness of breath.   Objective:    General: Well Developed, well nourished, and in no acute distress.  Neuro: Alert and oriented x3, extra-ocular muscles intact, sensation grossly intact.  HEENT: Normocephalic, atraumatic  Skin: Warm and dry, no rashes. Cardiac: Regular rate and rhythm, no murmurs rubs or gallops, no lower extremity edema.  Respiratory: Clear to auscultation bilaterally. Not using accessory muscles, speaking in full sentences.   Impression and Recommendations:   Fibromyalgia -continue to work on gentle exercise and stretches and working on getting quality rest.  Tremor -doing well on propranolol.  Need to monitor periodically.  Status migrainous-we will give Toradol  injection 60 mg IM here in the office and we will give her a prednisone taper to do over the next 5 days.  Avoid taking other medications for headache during that timeframe.  If we cannot get it to break after the weekend then she can give [place] call back.  Trochanteric bursitis, left-much improved after injection.  Improved pain and mobility.  Just reminded her to make sure that she is doing her stretches and exercises at home.  Did encourage her to schedule her mammogram as well.

## 2018-05-16 ENCOUNTER — Ambulatory Visit: Payer: BC Managed Care – PPO | Admitting: Family Medicine

## 2018-05-16 LAB — URINALYSIS, MICROSCOPIC ONLY
Bacteria, UA: NONE SEEN /HPF
Hyaline Cast: NONE SEEN /LPF

## 2018-05-17 ENCOUNTER — Encounter: Payer: Self-pay | Admitting: Family Medicine

## 2018-05-17 ENCOUNTER — Other Ambulatory Visit: Payer: Self-pay | Admitting: Family Medicine

## 2018-05-17 DIAGNOSIS — R82998 Other abnormal findings in urine: Secondary | ICD-10-CM

## 2018-05-17 DIAGNOSIS — R319 Hematuria, unspecified: Secondary | ICD-10-CM

## 2018-05-18 LAB — URINALYSIS, ROUTINE W REFLEX MICROSCOPIC
BILIRUBIN URINE: NEGATIVE
GLUCOSE, UA: NEGATIVE
Hyaline Cast: NONE SEEN /LPF
Ketones, ur: NEGATIVE
Nitrite: NEGATIVE
Protein, ur: NEGATIVE
Specific Gravity, Urine: 1.011 (ref 1.001–1.03)
pH: <=5 (ref 5.0–8.0)

## 2018-05-18 LAB — URINE CULTURE
MICRO NUMBER:: 228226
SPECIMEN QUALITY:: ADEQUATE

## 2018-05-21 ENCOUNTER — Encounter: Payer: Self-pay | Admitting: Family Medicine

## 2018-05-24 MED ORDER — AZITHROMYCIN 250 MG PO TABS: ORAL_TABLET | ORAL | 0 refills | Status: AC

## 2018-05-28 ENCOUNTER — Other Ambulatory Visit: Payer: Self-pay | Admitting: Family Medicine

## 2018-07-03 ENCOUNTER — Encounter: Payer: Self-pay | Admitting: *Deleted

## 2018-07-27 ENCOUNTER — Other Ambulatory Visit: Payer: Self-pay | Admitting: Physician Assistant

## 2018-07-27 DIAGNOSIS — M545 Low back pain, unspecified: Secondary | ICD-10-CM

## 2018-09-16 ENCOUNTER — Ambulatory Visit: Payer: BC Managed Care – PPO | Admitting: Family Medicine

## 2018-09-20 ENCOUNTER — Encounter: Payer: Self-pay | Admitting: Family Medicine

## 2018-09-20 ENCOUNTER — Ambulatory Visit: Payer: BC Managed Care – PPO | Admitting: Family Medicine

## 2018-09-20 VITALS — BP 130/73 | HR 88 | Ht 66.0 in | Wt 149.0 lb

## 2018-09-20 DIAGNOSIS — M25552 Pain in left hip: Secondary | ICD-10-CM

## 2018-09-20 DIAGNOSIS — G250 Essential tremor: Secondary | ICD-10-CM

## 2018-09-20 DIAGNOSIS — M797 Fibromyalgia: Secondary | ICD-10-CM | POA: Diagnosis not present

## 2018-09-20 DIAGNOSIS — J302 Other seasonal allergic rhinitis: Secondary | ICD-10-CM

## 2018-09-20 DIAGNOSIS — G43009 Migraine without aura, not intractable, without status migrainosus: Secondary | ICD-10-CM

## 2018-09-20 DIAGNOSIS — M545 Low back pain, unspecified: Secondary | ICD-10-CM

## 2018-09-20 DIAGNOSIS — M542 Cervicalgia: Secondary | ICD-10-CM

## 2018-09-20 DIAGNOSIS — Z6824 Body mass index (BMI) 24.0-24.9, adult: Secondary | ICD-10-CM

## 2018-09-20 DIAGNOSIS — R635 Abnormal weight gain: Secondary | ICD-10-CM

## 2018-09-20 DIAGNOSIS — M25551 Pain in right hip: Secondary | ICD-10-CM

## 2018-09-20 MED ORDER — DULOXETINE HCL 30 MG PO CPEP: 30.0000 mg | ORAL_CAPSULE | Freq: Every day | ORAL | 1 refills | Status: DC

## 2018-09-20 MED ORDER — FLUTICASONE PROPIONATE 50 MCG/ACT NA SUSP: NASAL | 3 refills | Status: DC

## 2018-09-20 MED ORDER — SUMATRIPTAN SUCCINATE 100 MG PO TABS: ORAL_TABLET | ORAL | 11 refills | Status: DC

## 2018-09-20 MED ORDER — PROPRANOLOL HCL ER 60 MG PO CP24: 60.0000 mg | ORAL_CAPSULE | Freq: Every day | ORAL | 1 refills | Status: DC

## 2018-09-20 MED ORDER — CYCLOBENZAPRINE HCL 10 MG PO TABS: ORAL_TABLET | ORAL | 1 refills | Status: DC

## 2018-09-20 MED ORDER — LINACLOTIDE 290 MCG PO CAPS: ORAL_CAPSULE | ORAL | 1 refills | Status: DC

## 2018-09-20 MED ORDER — METFORMIN HCL 500 MG PO TABS: 500.0000 mg | ORAL_TABLET | Freq: Two times a day (BID) | ORAL | 1 refills | Status: DC

## 2018-09-20 MED ORDER — NABUMETONE 500 MG PO TABS: ORAL_TABLET | ORAL | 1 refills | Status: DC

## 2018-09-20 MED ORDER — TOPIRAMATE 50 MG PO TABS: ORAL_TABLET | ORAL | 3 refills | Status: DC

## 2018-09-20 MED ORDER — PHENTERMINE HCL 15 MG PO CAPS: 15.0000 mg | ORAL_CAPSULE | ORAL | 0 refills | Status: DC

## 2018-09-20 NOTE — Progress Notes (Signed)
Established Patient Office Visit  Subjective:  Patient ID: Carol Diaz, female    DOB: 1966/01/19  Age: 53 y.o. MRN: [id number]  CC:  Chief Complaint  Patient presents with  . Fibromyalgia  . Hip Pain    HPI Carol AltKathy M Diaz presents for   F/U Migraines HA - doing well overall with migraines.  She has not had to take her Imitrex in well over a month she is still taking her topiramate.  F/U Fibromyalgia - Hips are hurting as well as her entire legs.  She says pretty much from the waist down she just feels like she constantly aches.  Sometimes it makes it very difficult for her to sleep.  She cannot put pressure on one hip and then has to get off of the other one.  She says even just going out and doing some shopping or going to the store sometimes she actually has to stop and take a break and sit down and rest.  She says that when COVID first to and she started working from home she wanted to really try to get back into some exercise so started walk trying to walk 20 minutes a day but says that she would feel completely exhausted the next day and week.   She also occasionally gets some pain in going into that left buttock cheek.  And says it will ache as well.  She would like to work on losing weight again. She would like to restart phentermine if possible she is going to a wedding in Sept. she has taken it in the past and has done well with it she denies any recent chest pain or shortness of breath or palpitations and has never had any problems with the medication in the past.  Past Medical History:  Diagnosis Date  . Chronic fatigue syndrome   . Hypertension   . Migraines   . Palpitations     Past Surgical History:  Procedure Laterality Date  . CESAREAN SECTION    . ivp    . LAPAROSCOPIC APPENDECTOMY N/A 05/17/2017   Procedure: DIAGNOSTIC LAPAROSCOPY APPENDECTOMY;  Surgeon: Romie Leveehomas, Alicia, MD;  Location: [facility];  Service: General;  Laterality: N/A;  . TUBAL LIGATION       Family History  Problem Relation Age of Onset  . Emphysema Mother   . Hypertension Father   . Heart disease Brother [id number]       mi    Social History   Socioeconomic History  . Marital status: Married    Spouse name: Not on file  . Number of children: 2  . Years of education: Not on file  . Highest education level: Not on file  Occupational History    Employer: [employer]  Social Needs  . Financial resource strain: Not on file  . Food insecurity    Worry: Not on file    Inability: Not on file  . Transportation needs    Medical: Not on file    Non-medical: Not on file  Tobacco Use  . Smoking status: Never Smoker  . Smokeless tobacco: Never Used  Substance and Sexual Activity  . Alcohol use: No  . Drug use: No  . Sexual activity: Yes    Partners: Male    Birth control/protection: Pill  Lifestyle  . Physical activity    Days per week: Not on file    Minutes per session: Not on file  . Stress: Not on file  Relationships  . Social  connections    Talks on phone: Not on file    Gets together: Not on file    Attends religious service: Not on file    Active member of club or organization: Not on file    Attends meetings of clubs or organizations: Not on file    Relationship status: Not on file  . Intimate partner violence    Fear of current or ex partner: Not on file    Emotionally abused: Not on file    Physically abused: Not on file    Forced sexual activity: Not on file  Other Topics Concern  . Not on file  Social History Narrative   Some exercise.     Outpatient Medications Prior to Visit  Medication Sig Dispense Refill  . acetaminophen (TYLENOL) 325 MG tablet Two tablets every 4 hours as needed for pain.  You can buy over the counter at any drug store.    . INTROVALE 0.15-0.03 MG tablet TAKE 1 TABLET BY MOUTH DAILY 91 tablet 3  . cyclobenzaprine (FLEXERIL) 10 MG tablet TAKE 1/2 TO 1 TABLET BY MOUTH THREE TIMES DAILY AS NEEDED FOR MUSCLE SPASMS  45 tablet 1  . fluticasone (FLONASE) 50 MCG/ACT nasal spray One spray in each nostril twice a day, use left hand for right nostril, and right hand for left nostril. 48 g 3  . LINZESS 290 MCG CAPS capsule TAKE 1 CAPSULE BY MOUTH DAILY 30 MINUTES BEFORE FIRST MEAL OF THE DAY 90 capsule 1  . metFORMIN (GLUCOPHAGE) 500 MG tablet TAKE 1 TABLET(500 MG) BY MOUTH TWICE DAILY WITH A MEAL 180 tablet 1  . nabumetone (RELAFEN) 500 MG tablet TAKE 1 TABLET(500 MG) BY MOUTH AT BEDTIME AS NEEDED 90 tablet 1  . propranolol ER (INDERAL LA) 60 MG 24 hr capsule TAKE 1 CAPSULE BY MOUTH DAILY 90 capsule 1  . SUMAtriptan (IMITREX) 100 MG tablet TAKE 1 TABLET BY MOUTH EVERY 2 HOURS IF NEEDED FOR MIGRAINES AS DIRECTED 10 tablet 0  . topiramate (TOPAMAX) 50 MG tablet TAKE 1 TABLET(50 MG) BY MOUTH AT BEDTIME 90 tablet 3   No facility-administered medications prior to visit.     Allergies  Allergen Reactions  . Fluoxetine Other (See Comments)    Made her tremor worse   . Lexapro [Escitalopram Oxalate] Other (See Comments)    Headache  . Morphine Nausea And Vomiting  . Sertraline Other (See Comments)    Headache     ROS Review of Systems    Objective:    Physical Exam  Constitutional: She is oriented to person, place, and time. She appears well-developed and well-nourished.  HENT:  Head: Normocephalic and atraumatic.  Cardiovascular: Normal rate, regular rhythm and normal heart sounds.  Pulmonary/Chest: Effort normal and breath sounds normal.  Musculoskeletal:     Comments: Hips with normal range of motion.  Tender over both greater trochanter.  Neurological: She is alert and oriented to person, place, and time.  Skin: Skin is warm and dry.  Psychiatric: She has a normal mood and affect. Her behavior is normal.    BP 130/73   Pulse 88   Ht 5\' 6"  (1.676 m)   Wt 149 lb (67.6 kg)   SpO2 100%   BMI 24.05 kg/m  Wt Readings from Last 3 Encounters:  09/20/18 149 lb (67.6 kg)  05/15/18 150 lb (68 kg)   05/03/18 151 lb (68.5 kg)     Health Maintenance Due  Topic Date Due  . HIV Screening  03/21/1981  . MAMMOGRAM  04/27/2016    There are no preventive care reminders to display for this patient.  Lab Results  Component Value Date   TSH 1.60 06/07/2015   Lab Results  Component Value Date   WBC 8.3 04/29/2018   HGB 12.9 04/29/2018   HCT 38.1 04/29/2018   MCV 90.5 04/29/2018   PLT 317 04/29/2018   Lab Results  Component Value Date   NA 138 04/29/2018   K 4.0 04/29/2018   CO2 22 04/29/2018   GLUCOSE 107 (H) 04/29/2018   BUN 15 04/29/2018   CREATININE 1.00 04/29/2018   BILITOT 0.3 02/12/2018   ALKPHOS 51 09/24/2015   AST 19 02/12/2018   ALT 27 02/12/2018   PROT 6.0 (L) 02/12/2018   ALBUMIN 3.9 09/24/2015   CALCIUM 8.6 04/29/2018   Lab Results  Component Value Date   CHOL 265 (H) 08/24/2017   Lab Results  Component Value Date   HDL 47 (L) 08/24/2017   Lab Results  Component Value Date   LDLCALC 183 (H) 08/24/2017   Lab Results  Component Value Date   TRIG 193 (H) 08/24/2017   Lab Results  Component Value Date   CHOLHDL 5.6 (H) 08/24/2017   Lab Results  Component Value Date   HGBA1C 5.3 01/01/2014      Assessment & Plan:   Problem List Items Addressed This Visit      Cardiovascular and Mediastinum   Migraine headache    She is doing really well with control of her migraines right now.  No recent flares or exacerbations.      Relevant Medications   cyclobenzaprine (FLEXERIL) 10 MG tablet   nabumetone (RELAFEN) 500 MG tablet   propranolol ER (INDERAL LA) 60 MG 24 hr capsule   SUMAtriptan (IMITREX) 100 MG tablet   topiramate (TOPAMAX) 50 MG tablet   DULoxetine (CYMBALTA) 30 MG capsule     Nervous and Auditory   Essential tremor    Doing well with propranolol.  Continue current regimen.        Other   Neck pain   Relevant Medications   DULoxetine (CYMBALTA) 30 MG capsule   Fibromyalgia - Primary    For some reason Cymbalta which is  duloxetine was deleted from her medication list so she actually has not been taking it.  We will go ahead and restart the medication.  She was previously on 60 mg but will start with 30 since she has been off of it.  New prescription sent to pharmacy.  Think this will be helpful.  We also discussed the importance of doing graded exercise if she is gone to get back into some type of routine so I would suggest starting with a 10-minute low paste walk initially and then gradually building up.      Relevant Medications   cyclobenzaprine (FLEXERIL) 10 MG tablet   nabumetone (RELAFEN) 500 MG tablet   topiramate (TOPAMAX) 50 MG tablet   DULoxetine (CYMBALTA) 30 MG capsule    Other Visit Diagnoses    Acute left-sided low back pain without sciatica       Relevant Medications   cyclobenzaprine (FLEXERIL) 10 MG tablet   nabumetone (RELAFEN) 500 MG tablet   Seasonal allergic rhinitis, unspecified trigger       Relevant Medications   fluticasone (FLONASE) 50 MCG/ACT nasal spray   Abnormal weight gain       BMI 24.0-24.9, adult       Bilateral hip pain  Abnormal weight gain-discussed starting phentermine again.  We will start with 15 mg and then plan to follow-up in 4 weeks.  Encouraged her to do a 10-minute stretching routine daily and if she really does want to start a walking program do no more than 10 minutes as I do not want to flare her fibromyalgia.  We also discussed cutting back on sweets which is her weakness and really trying to increase her fruits and vegetables in her diet.  She felt like these would be good goals that she could work on over the next 4 weeks.  Left-sided low back pain-recommend follow-up with sports medicine.  Did refill Flexeril as well as Relafen.  Bilateral hip pain most consistent with trochanteric bursitis-she has had multiple injections over the years so would like to consider getting her in with sports medicine specifically for this to see if there is anything  different that we can do to really help her.  She says she has been doing her home stretches.  Meds ordered this encounter  Medications  . cyclobenzaprine (FLEXERIL) 10 MG tablet    Sig: TAKE 1/2 TO 1 TABLET BY MOUTH THREE TIMES DAILY AS NEEDED FOR MUSCLE SPASMS    Dispense:  45 tablet    Refill:  1  . fluticasone (FLONASE) 50 MCG/ACT nasal spray    Sig: One spray in each nostril twice a day, use left hand for right nostril, and right hand for left nostril.    Dispense:  48 g    Refill:  3  . linaclotide (LINZESS) 290 MCG CAPS capsule    Sig: TAKE 1 CAPSULE BY MOUTH DAILY 30 MINUTES BEFORE FIRST MEAL OF THE DAY    Dispense:  90 capsule    Refill:  1  . metFORMIN (GLUCOPHAGE) 500 MG tablet    Sig: Take 1 tablet (500 mg total) by mouth 2 (two) times daily with a meal.    Dispense:  180 tablet    Refill:  1  . nabumetone (RELAFEN) 500 MG tablet    Sig: TAKE 1 TABLET(500 MG) BY MOUTH AT BEDTIME AS NEEDED    Dispense:  90 tablet    Refill:  1  . propranolol ER (INDERAL LA) 60 MG 24 hr capsule    Sig: Take 1 capsule (60 mg total) by mouth daily.    Dispense:  90 capsule    Refill:  1  . SUMAtriptan (IMITREX) 100 MG tablet    Sig: TAKE 1 TABLET BY MOUTH EVERY 2 HOURS IF NEEDED FOR MIGRAINES AS DIRECTED    Dispense:  10 tablet    Refill:  11  . topiramate (TOPAMAX) 50 MG tablet    Sig: TAKE 1 TABLET(50 MG) BY MOUTH AT BEDTIME    Dispense:  90 tablet    Refill:  3  . phentermine 15 MG capsule    Sig: Take 1 capsule (15 mg total) by mouth every morning.    Dispense:  30 capsule    Refill:  0  . DULoxetine (CYMBALTA) 30 MG capsule    Sig: Take 1 capsule (30 mg total) by mouth daily.    Dispense:  90 capsule    Refill:  1    Follow-up: Return in about 4 weeks (around 10/18/2018) for weight check fo rmedication- ok for in office or virtual .    Beatrice Lecher, MD

## 2018-09-20 NOTE — Assessment & Plan Note (Signed)
For some reason Cymbalta which is duloxetine was deleted from her medication list so she actually has not been taking it.  We will go ahead and restart the medication.  She was previously on 60 mg but will start with 30 since she has been off of it.  New prescription sent to pharmacy.  Think this will be helpful.  We also discussed the importance of doing graded exercise if she is gone to get back into some type of routine so I would suggest starting with a 10-minute low paste walk initially and then gradually building up.

## 2018-09-20 NOTE — Assessment & Plan Note (Signed)
She is doing really well with control of her migraines right now.  No recent flares or exacerbations.

## 2018-09-20 NOTE — Assessment & Plan Note (Signed)
Doing well with propranolol.  Continue current regimen.

## 2018-09-25 ENCOUNTER — Ambulatory Visit (INDEPENDENT_AMBULATORY_CARE_PROVIDER_SITE_OTHER): Payer: BC Managed Care – PPO | Admitting: Family Medicine

## 2018-09-25 ENCOUNTER — Encounter: Payer: Self-pay | Admitting: Family Medicine

## 2018-09-25 VITALS — BP 132/82 | HR 73 | Ht 65.0 in | Wt 150.0 lb

## 2018-09-25 DIAGNOSIS — M79604 Pain in right leg: Secondary | ICD-10-CM

## 2018-09-25 DIAGNOSIS — M7061 Trochanteric bursitis, right hip: Secondary | ICD-10-CM

## 2018-09-25 DIAGNOSIS — M797 Fibromyalgia: Secondary | ICD-10-CM | POA: Diagnosis not present

## 2018-09-25 DIAGNOSIS — M7062 Trochanteric bursitis, left hip: Secondary | ICD-10-CM

## 2018-09-25 DIAGNOSIS — R0989 Other specified symptoms and signs involving the circulatory and respiratory systems: Secondary | ICD-10-CM | POA: Diagnosis not present

## 2018-09-25 DIAGNOSIS — M79605 Pain in left leg: Secondary | ICD-10-CM

## 2018-09-25 MED ORDER — DICLOFENAC SODIUM 1 % TD GEL: 4.0000 g | Freq: Four times a day (QID) | TRANSDERMAL | 11 refills | Status: DC

## 2018-09-25 NOTE — Progress Notes (Signed)
Subjective:    I'm seeing this patient as a consultation for:  Agapito GamesMetheney, Catherine D, MD   CC: hip pain  HPI: Patient has a history of bilateral leg pain for 3 to 4 months.  She notes that it is slowly been worsening.  She denies any injury.  She notes her pain is primarily located at the lateral hips left worse than right.  She notes pain is worse with prolonged standing and activity as well as when she lays on her left side.  She is tried some home exercise program for a few days which have not helped yet.  Additionally she is tried Tylenol which helps a little.  She is tried heating packs which helps some.  She also however has pain radiating down her left leg to the anterior shin and calf.  She has this is worse with activity.  It worsens when she walks and gets better when she rests.  She denies any fevers chills nausea vomiting or diarrhea.   Of note patient does have a history of fibromyalgia.  She was seen by her PCP few days prior who started Cymbalta. Olegario MessierKathy thinks it may have started helping some.  Past medical history, Surgical history, Family history not pertinant except as noted below, Social history, Allergies, and medications have been entered into the medical record, reviewed, and no changes needed.   Review of Systems: No headache, visual changes, nausea, vomiting, diarrhea, constipation, dizziness, abdominal pain, skin rash, fevers, chills, night sweats, weight loss, swollen lymph nodes, body aches, joint swelling, muscle aches, chest pain, shortness of breath, mood changes, visual or auditory hallucinations.   Objective:    Vitals:   09/25/18 1538  BP: 132/82  Pulse: 73   General: Well Developed, well nourished, and in no acute distress.  Neuro/Psych: Alert and oriented x3, extra-ocular muscles intact, able to move all 4 extremities, sensation grossly intact. Skin: Warm and dry, no rashes noted.  Respiratory: Not using accessory muscles, speaking in full sentences,  trachea midline.  Cardiovascular: Palpable dorsal pedis and posterior tibialis pulse right leg.  Diminished or nonpalpable posterior tibialis dorsal pedis pulse left foot., no extremity edema. Abdomen: Does not appear distended. MSK:  L-spine: Nontender to spinal midline normal lumbar motion. Left hip: Normal-appearing normal motion.  Tender palpation greater trochanter.  Pain with FADIR test. Hip abduction strength diminished 4/5 with pain.  Hip external rotation strength very painful with 3/5 strength.  Internal rotation and adduction strength normal.  Right hip: Normal-appearing normal motion.  Mildly tender to palpation greater trochanter.  Hip abduction strength diminished and mildly painful 4/5.  Normal external rotation strength and internal rotation and adduction strength.  Lower leg bilaterally nontender with no palpable abnormalities or tender spots.  No masses palpated.  Normal foot and ankle motion.  Pulses diminished left foot compared to right as noted above.  Lab and Radiology Results EXAM: CT ABDOMEN AND PELVIS WITHOUT CONTRAST   TECHNIQUE: Multidetector CT imaging of the abdomen and pelvis was performed following the standard protocol without IV contrast.   COMPARISON:  CT abdomen pelvis dated May 15, 2017.   FINDINGS: Lower chest: No acute abnormality.   Hepatobiliary: No focal liver abnormality is seen. No gallstones, gallbladder wall thickening, or biliary dilatation.   Pancreas: Unremarkable. No pancreatic ductal dilatation or surrounding inflammatory changes.   Spleen: Normal in size without focal abnormality.   Adrenals/Urinary Tract: Adrenal glands are unremarkable. Kidneys are normal, without renal calculi, focal lesion, or hydronephrosis. Bladder is  decompressed.   Stomach/Bowel: Stomach is within normal limits. Appendix is surgically absent. No evidence of bowel wall thickening, distention, or inflammatory changes.   Vascular/Lymphatic: No  significant vascular findings are present. No enlarged abdominal or pelvic lymph nodes.   Reproductive: Uterus and bilateral adnexa are unremarkable.   Other: Unchanged tiny fat containing umbilical hernia. No free fluid or pneumoperitoneum.   Musculoskeletal: No acute or significant osseous findings.   IMPRESSION: 1.  No acute intra-abdominal process.  No urolithiasis.     Electronically Signed   By: Titus Dubin M.D.   On: 04/29/2018 11:10 I personally (independently) visualized and performed the interpretation of the images attached in this note.   I specifically focused on bony structures of the hips and pelvis bilaterally.  I find no severe degenerative changes or concerning abnormalities.  Additionally no significant aortic or iliac atherosclerosis changes.   Impression and Recommendations:    Assessment and Plan: 53 y.o. female with Bilateral hip pain left worse than right associated with weakness to hip abduction and external rotation.  Very likely trochanteric bursitis. This is complicated by fibromyalgia.  I am concerned that Phoua will not be able to rehab her way out of this on her own given her overall pain state.  Plan to refer to physical therapy but also work on home exercise program focus on stretching and strengthening.  Additionally use diclofenac gel.  Continue Cymbalta.  This is a great idea if not sufficient could increase Cymbalta or add Lyrica which may also help.  Patient will check back with me in about a week via my chart message and we can adjust medications as needed.  Additionally patient does have some pain in her left lower leg typically worse with walking or exertion.  I cannot easily palpate pulses in her left foot and am somewhat concerned for claudication.  She does not have a lot of risk factors for this but I do think it is worth evaluating with ABI and vascular ultrasound.  Recheck back with me in a few weeks.   Orders Placed This  Encounter  Procedures  . Ambulatory referral to Physical Therapy    Referral Priority:   Routine    Referral Type:   Physical Medicine    Referral Reason:   Specialty Services Required    Requested Specialty:   Physical Therapy   Meds ordered this encounter  Medications  . diclofenac sodium (VOLTAREN) 1 % GEL    Sig: Apply 4 g topically 4 (four) times daily. To affected joint.    Dispense:  100 g    Refill:  11    Discussed warning signs or symptoms. Please see discharge instructions. Patient expresses understanding.

## 2018-09-25 NOTE — Patient Instructions (Signed)
Thank you for coming in today. Attend PT.  Apply the diclofenac gel to the hips 4x daily as needed for pain.  Lets see how cymbalta does.  If needed we can increase dose and or add Lyrica.  Check back with me via mychart message in 1 week.   Home exercises.  Cross over stretch  Figure 4 stretch Standing IT band stretch. Remember to bend the back knee.   Exercises: 10-30 reps 2x daily Side leg raises.  Side leg raises up and back a bit.   You will also hear about ABI test to evaluate the artery in the left leg.  Let me know if you do not hear from physical therapy or the vascular clinic.   Recheck with me in 4 weeks or sooner if needed.  We can do more invasive or aggressive treatment.    Hip Bursitis  Hip bursitis is inflammation of a fluid-filled sac (bursa) in the hip joint. The bursa prevents the bones in the hip joint from rubbing against each other. Hip bursitis can cause mild to moderate pain, and symptoms often come and go over time. What are the causes? This condition may be caused by:  Injury to the hip.  Overuse of the muscles that surround the hip joint.  Previous injury or surgery of the hip.  Arthritis or gout.  Diabetes.  Thyroid disease.  Infection. In some cases, the cause may not be known. What are the signs or symptoms? Symptoms of this condition include:  Mild or moderate pain in the hip area. Pain may get worse with movement.  Tenderness and swelling of the hip, especially on the outer side of the hip.  In rare cases, the bursa may become infected. This may cause a fever, as well as warmth and redness in the area. Symptoms may come and go. How is this diagnosed? This condition may be diagnosed based on:  A physical exam.  Your medical history.  X-rays.  Removal of fluid from your inflamed bursa for testing (biopsy). You may be sent to a health care provider who specializes in bone diseases (orthopedist) or a provider who specializes in  joint inflammation (rheumatologist). How is this treated? This condition is treated by resting, icing, applying pressure (compression), and raising (elevating) the injured area. This is called RICE treatment. In some cases, this may be enough to make your symptoms go away. Treatment may also include:  Using crutches.  Draining fluid out of the bursa to help relieve swelling.  Injecting medicine that helps to reduce inflammation (cortisone).  Additional medicines if the bursa is infected. Follow these instructions at home: Managing pain, stiffness, and swelling   If directed, put ice on the painful area. ? Put ice in a plastic bag. ? Place a towel between your skin and the bag. ? Leave the ice on for 20 minutes, 2-3 times a day. ? Raise (elevate) your hip above the level of your heart as much as you can without pain. To do this, try putting a pillow under your hips while you lie down. Activity  Return to your normal activities as told by your health care provider. Ask your health care provider what activities are safe for you.  Rest and protect your hip as much as possible until your pain and swelling get better. General instructions  Take over-the-counter and prescription medicines only as told by your health care provider.  Wear compression wraps only as told by your health care provider.  Do not  use your hip to support your body weight until your health care provider says that you can. Use crutches as told by your health care provider.  Gently massage and stretch your injured area as often as is comfortable.  Keep all follow-up visits as told by your health care provider. This is important. How is this prevented?  Exercise regularly, as told by your health care provider.  Warm up and stretch before being active.  Cool down and stretch after being active.  If an activity irritates your hip or causes pain, avoid the activity as much as possible.  Avoid sitting down for  long periods at a time. Contact a health care provider if you:  Have a fever.  Develop new symptoms.  Have difficulty walking or doing everyday activities.  Have pain that gets worse or does not get better with medicine.  Develop red skin or a feeling of warmth in your hip area. Get help right away if you:  Cannot move your hip.  Have severe pain. Summary  Hip bursitis is inflammation of a fluid-filled sac (bursa) in the hip joint.  Hip bursitis can cause mild to moderate pain, and symptoms often come and go over time.  This condition is treated with rest, ice, compression, elevation, and medicines. This information is not intended to replace advice given to you by your health care provider. Make sure you discuss any questions you have with your health care provider. Document Released: 09/02/2001 Document Revised: 11/19/2017 Document Reviewed: 11/19/2017 Elsevier Patient Education  2020 [place].    Peripheral Vascular Disease  Peripheral vascular disease (PVD) is a disease of the blood vessels that are not part of your heart and brain. A simple term for PVD is poor circulation. In most cases, PVD narrows the blood vessels that carry blood from your heart to the rest of your body. This can reduce the supply of blood to your arms, legs, and internal organs, like your stomach or kidneys. However, PVD most often affects a person's lower legs and feet. Without treatment, PVD tends to get worse. PVD can also lead to acute ischemic limb. This is when an arm or leg suddenly cannot get enough blood. This is a medical emergency. Follow these instructions at home: Lifestyle  Do not use any products that contain nicotine or tobacco, such as cigarettes and e-cigarettes. If you need help quitting, ask your doctor.  Lose weight if you are overweight. Or, stay at a healthy weight as told by your doctor.  Eat a diet that is low in fat and cholesterol. If you need help, ask your doctor.   Exercise regularly. Ask your doctor for activities that are right for you. General instructions  Take over-the-counter and prescription medicines only as told by your doctor.  Take good care of your feet: ? Wear comfortable shoes that fit well. ? Check your feet often for any cuts or sores.  Keep all follow-up visits as told by your doctor This is important. Contact a doctor if:  You have cramps in your legs when you walk.  You have leg pain when you are at rest.  You have coldness in a leg or foot.  Your skin changes.  You are unable to get or have an erection (erectile dysfunction).  You have cuts or sores on your feet that do not heal. Get help right away if:  Your arm or leg turns cold, numb, and blue.  Your arms or legs become red, warm, swollen, painful,  or numb.  You have chest pain.  You have trouble breathing.  You suddenly have weakness in your face, arm, or leg.  You become very confused or you cannot speak.  You suddenly have a very bad headache.  You suddenly cannot see. Summary  Peripheral vascular disease (PVD) is a disease of the blood vessels.  A simple term for PVD is poor circulation. Without treatment, PVD tends to get worse.  Treatment may include exercise, low fat and low cholesterol diet, and quitting smoking. This information is not intended to replace advice given to you by your health care provider. Make sure you discuss any questions you have with your health care provider. Document Released: 06/07/2009 Document Revised: 02/23/2017 Document Reviewed: 04/20/2016 Elsevier Patient Education  2020 [employer]

## 2018-09-26 ENCOUNTER — Encounter: Payer: Self-pay | Admitting: Family Medicine

## 2018-10-04 ENCOUNTER — Ambulatory Visit: Payer: BC Managed Care – PPO | Admitting: Rehabilitative and Restorative Service Providers"

## 2018-10-04 ENCOUNTER — Ambulatory Visit (HOSPITAL_BASED_OUTPATIENT_CLINIC_OR_DEPARTMENT_OTHER)
Admission: RE | Admit: 2018-10-04 | Discharge: 2018-10-04 | Disposition: A | Discharge status: Home / Self Care | Payer: BC Managed Care – PPO | Source: Ambulatory Visit | Attending: Family Medicine | Admitting: Family Medicine

## 2018-10-04 ENCOUNTER — Other Ambulatory Visit: Payer: Self-pay

## 2018-10-04 DIAGNOSIS — R0989 Other specified symptoms and signs involving the circulatory and respiratory systems: Secondary | ICD-10-CM | POA: Insufficient documentation

## 2018-10-04 DIAGNOSIS — M79604 Pain in right leg: Secondary | ICD-10-CM | POA: Insufficient documentation

## 2018-10-04 DIAGNOSIS — M79605 Pain in left leg: Secondary | ICD-10-CM | POA: Insufficient documentation

## 2018-10-04 NOTE — Progress Notes (Signed)
Bilateral ABI Doppler performed   10/04/18 Carol Diaz RDCS, RVT 

## 2018-10-07 ENCOUNTER — Encounter: Payer: Self-pay | Admitting: Family Medicine

## 2018-10-07 DIAGNOSIS — R6889 Other general symptoms and signs: Secondary | ICD-10-CM | POA: Insufficient documentation

## 2018-10-18 ENCOUNTER — Encounter: Payer: Self-pay | Admitting: Family Medicine

## 2018-10-18 ENCOUNTER — Telehealth (INDEPENDENT_AMBULATORY_CARE_PROVIDER_SITE_OTHER): Payer: BC Managed Care – PPO | Admitting: Family Medicine

## 2018-10-18 VITALS — BP 128/82 | Ht 66.0 in | Wt 143.6 lb

## 2018-10-18 DIAGNOSIS — R6889 Other general symptoms and signs: Secondary | ICD-10-CM

## 2018-10-18 DIAGNOSIS — R0989 Other specified symptoms and signs involving the circulatory and respiratory systems: Secondary | ICD-10-CM | POA: Diagnosis not present

## 2018-10-18 DIAGNOSIS — Z6823 Body mass index (BMI) 23.0-23.9, adult: Secondary | ICD-10-CM

## 2018-10-18 DIAGNOSIS — M797 Fibromyalgia: Secondary | ICD-10-CM

## 2018-10-18 MED ORDER — PHENTERMINE HCL 15 MG PO CAPS: 15.0000 mg | ORAL_CAPSULE | ORAL | 0 refills | Status: DC

## 2018-10-18 MED ORDER — AMOXICILLIN-POT CLAVULANATE 875-125 MG PO TABS: 1.0000 | ORAL_TABLET | Freq: Two times a day (BID) | ORAL | 0 refills | Status: DC

## 2018-10-18 NOTE — Progress Notes (Signed)
Virtual Visit via Video Note  I connected with Carol Diaz on 10/18/18 at  3:20 PM EDT by a video enabled telemedicine application and verified that I am speaking with the correct person using two identifiers.   I discussed the limitations of evaluation and management by telemedicine and the availability of in person appointments. The patient expressed understanding and agreed to proceed.  Pt was at home and I was in my office for the virtual visit.      Established Patient Office Visit  Subjective:  Patient ID: Carol Diaz, female    DOB: 11-14-1965  Age: 53 y.o. MRN: [id number]  CC:  Chief Complaint  Patient presents with  . abnormal weight gain    HPI Carol Diaz presents for   Follow-up fibromyalgia.  We restarted her Cymbalta.  At the last visit we realized that at some point it had disappeared off her med list and she was no longer taking it.  She is actually been up to 60 mg at one point but is currently taking 30 mg and says within about 3 days of being on the medication she noticed a big difference in the pain in her legs that she was experiencing and so wants to continue with that and feels like it has been really helpful.  Also wants to follow-up on the phentermine.  She has been working hard to try to control her weight she has made some dietary changes and is trying to do some graded exercise.  She is actually down 7 pounds in the last 4 weeks.  She has been feeling good on the medication and denies any chest pain, shortness of breath palpitations etc.  Had 10 a tooth extracted about 10 days ago.  She says then a couple of days ago she was trying to swish and noticed that the fluid was actually going up into her nose.  She called the dentist office where she had the tooth extracted and they said it is probably a whole going into her sinuses.  They said that it should likely heal within 2 to 3 weeks but she is concerned because over the last 24 hours she is started to  notice a foul taste and smell and is worried that she is getting a sinus infection.     Past Medical History:  Diagnosis Date  . Chronic fatigue syndrome   . Hypertension   . Migraines   . Palpitations     Past Surgical History:  Procedure Laterality Date  . CESAREAN SECTION    . ivp    . LAPAROSCOPIC APPENDECTOMY N/A 05/17/2017   Procedure: DIAGNOSTIC LAPAROSCOPY APPENDECTOMY;  Surgeon: Romie Leveehomas, Alicia, MD;  Location: [facility];  Service: General;  Laterality: N/A;  . TUBAL LIGATION      Family History  Problem Relation Age of Onset  . Emphysema Mother   . Hypertension Father   . Heart disease Brother 350       mi    Social History   Socioeconomic History  . Marital status: Married    Spouse name: Not on file  . Number of children: 2  . Years of education: Not on file  . Highest education level: Not on file  Occupational History    Employer: [employer]  Social Needs  . Financial resource strain: Not on file  . Food insecurity    Worry: Not on file    Inability: Not on file  . Transportation needs  Medical: Not on file    Non-medical: Not on file  Tobacco Use  . Smoking status: Never Smoker  . Smokeless tobacco: Never Used  Substance and Sexual Activity  . Alcohol use: No  . Drug use: No  . Sexual activity: Yes    Partners: Male    Birth control/protection: Pill  Lifestyle  . Physical activity    Days per week: Not on file    Minutes per session: Not on file  . Stress: Not on file  Relationships  . Social Musicianconnections    Talks on phone: Not on file    Gets together: Not on file    Attends religious service: Not on file    Active member of club or organization: Not on file    Attends meetings of clubs or organizations: Not on file    Relationship status: Not on file  . Intimate partner violence    Fear of current or ex partner: Not on file    Emotionally abused: Not on file    Physically abused: Not on file    Forced sexual activity: Not  on file  Other Topics Concern  . Not on file  Social History Narrative   Some exercise.     Outpatient Medications Prior to Visit  Medication Sig Dispense Refill  . acetaminophen (TYLENOL) 325 MG tablet Two tablets every 4 hours as needed for pain.  You can buy over the counter at any drug store.    . cyclobenzaprine (FLEXERIL) 10 MG tablet TAKE 1/2 TO 1 TABLET BY MOUTH THREE TIMES DAILY AS NEEDED FOR MUSCLE SPASMS 45 tablet 1  . diclofenac sodium (VOLTAREN) 1 % GEL Apply 4 g topically 4 (four) times daily. To affected joint. 100 g 11  . DULoxetine (CYMBALTA) 30 MG capsule Take 1 capsule (30 mg total) by mouth daily. 90 capsule 1  . fluticasone (FLONASE) 50 MCG/ACT nasal spray One spray in each nostril twice a day, use left hand for right nostril, and right hand for left nostril. 48 g 3  . INTROVALE 0.15-0.03 MG tablet TAKE 1 TABLET BY MOUTH DAILY 91 tablet 3  . linaclotide (LINZESS) 290 MCG CAPS capsule TAKE 1 CAPSULE BY MOUTH DAILY 30 MINUTES BEFORE FIRST MEAL OF THE DAY 90 capsule 1  . metFORMIN (GLUCOPHAGE) 500 MG tablet Take 1 tablet (500 mg total) by mouth 2 (two) times daily with a meal. 180 tablet 1  . nabumetone (RELAFEN) 500 MG tablet TAKE 1 TABLET(500 MG) BY MOUTH AT BEDTIME AS NEEDED 90 tablet 1  . propranolol ER (INDERAL LA) 60 MG 24 hr capsule Take 1 capsule (60 mg total) by mouth daily. 90 capsule 1  . SUMAtriptan (IMITREX) 100 MG tablet TAKE 1 TABLET BY MOUTH EVERY 2 HOURS IF NEEDED FOR MIGRAINES AS DIRECTED 10 tablet 11  . topiramate (TOPAMAX) 50 MG tablet TAKE 1 TABLET(50 MG) BY MOUTH AT BEDTIME 90 tablet 3  . phentermine 15 MG capsule Take 1 capsule (15 mg total) by mouth every morning. 30 capsule 0   No facility-administered medications prior to visit.     Allergies  Allergen Reactions  . Fluoxetine Other (See Comments)    Made her tremor worse   . Lexapro [Escitalopram Oxalate] Other (See Comments)    Headache  . Morphine Nausea And Vomiting  . Sertraline Other  (See Comments)    Headache     ROS Review of Systems    Objective:    Physical Exam  BP 128/82  Ht 5\' 6"  (1.676 m)   Wt 143 lb 9.6 oz (65.1 kg)   BMI 23.18 kg/m  Wt Readings from Last 3 Encounters:  10/18/18 143 lb 9.6 oz (65.1 kg)  09/25/18 150 lb (68 kg)  09/20/18 149 lb (67.6 kg)     Health Maintenance Due  Topic Date Due  . HIV Screening  03/21/1981  . MAMMOGRAM  04/27/2016    There are no preventive care reminders to display for this patient.  Lab Results  Component Value Date   TSH 1.60 06/07/2015   Lab Results  Component Value Date   WBC 8.3 04/29/2018   HGB 12.9 04/29/2018   HCT 38.1 04/29/2018   MCV 90.5 04/29/2018   PLT 317 04/29/2018   Lab Results  Component Value Date   NA 138 04/29/2018   K 4.0 04/29/2018   CO2 22 04/29/2018   GLUCOSE 107 (H) 04/29/2018   BUN 15 04/29/2018   CREATININE 1.00 04/29/2018   BILITOT 0.3 02/12/2018   ALKPHOS 51 09/24/2015   AST 19 02/12/2018   ALT 27 02/12/2018   PROT 6.0 (L) 02/12/2018   ALBUMIN 3.9 09/24/2015   CALCIUM 8.6 04/29/2018   Lab Results  Component Value Date   CHOL 265 (H) 08/24/2017   Lab Results  Component Value Date   HDL 47 (L) 08/24/2017   Lab Results  Component Value Date   LDLCALC 183 (H) 08/24/2017   Lab Results  Component Value Date   TRIG 193 (H) 08/24/2017   Lab Results  Component Value Date   CHOLHDL 5.6 (H) 08/24/2017   Lab Results  Component Value Date   HGBA1C 5.3 01/01/2014      Assessment & Plan:   Problem List Items Addressed This Visit      Other   Fibromyalgia - Primary    Doing well on Cymbalta 30 mg of again a continue with that current dose for now.  If at any point she is getting worse or has a flare then we can always consider going up to 60 mg as needed.  Otherwise we will plan to follow-up on her fibromyalgia in about 3 months.       Other Visit Diagnoses    Abnormal weight       BMI 23.0-23.9, adult       Sinus symptom           Abnormal weight gain-her BMI is now down to 23 she is doing really great.  Her goal is to get down to about 130 pounds.  So she would like to continue with the phentermine she has not had any symptoms on it and blood pressure looks well controlled.  Follow-up in 1 month for virtual visit.  Sinus symptoms I am concerned if he she truly has a hole where there was a tooth extracted going up into her sinuses.  I am concerned that she is now noticing a foul taste and smell that started about 24 hours ago some to go ahead and place her on Augmentin through the weekend.  Would encourage her to follow-up with her dentist to make sure that the hole is closing and that she is improving.  Evidently her dentist was out of the office and had to do a virtual consultation with another provider in the office.  Meds ordered this encounter  Medications  . phentermine 15 MG capsule    Sig: Take 1 capsule (15 mg total) by mouth every morning.    Dispense:  30 capsule    Refill:  0  . amoxicillin-clavulanate (AUGMENTIN) 875-125 MG tablet    Sig: Take 1 tablet by mouth 2 (two) times daily.    Dispense:  14 tablet    Refill:  0    Follow-up: Return in about 4 weeks (around 11/15/2018), or if symptoms worsen or fail to improve, for weight losss/Phentermine.   I discussed the assessment and treatment plan with the patient. The patient was provided an opportunity to ask questions and all were answered. The patient agreed with the plan and demonstrated an understanding of the instructions.   The patient was advised to call back or seek an in-person evaluation if the symptoms worsen or if the condition fails to improve as anticipated.  Beatrice Lecher, MD

## 2018-10-18 NOTE — Progress Notes (Signed)
Patient doing well on appetite suppressant.  Denies problems with insomnia, heart palpitations or tremors. Satisfied with weight loss thus far and is working on [employer] and regular exercise.   She is also doing well on the Cymbalta. Carol Diaz, Carol Diaz, CMA

## 2018-10-18 NOTE — Assessment & Plan Note (Signed)
Doing well on Cymbalta 30 mg of again a continue with that current dose for now.  If at any point she is getting worse or has a flare then we can always consider going up to 60 mg as needed.  Otherwise we will plan to follow-up on her fibromyalgia in about 3 months.

## 2018-10-26 ENCOUNTER — Other Ambulatory Visit: Payer: Self-pay | Admitting: Family Medicine

## 2018-10-28 ENCOUNTER — Ambulatory Visit: Payer: BC Managed Care – PPO | Admitting: Family Medicine

## 2018-11-15 ENCOUNTER — Telehealth (INDEPENDENT_AMBULATORY_CARE_PROVIDER_SITE_OTHER): Payer: BC Managed Care – PPO | Admitting: Family Medicine

## 2018-11-15 ENCOUNTER — Encounter: Payer: Self-pay | Admitting: Family Medicine

## 2018-11-15 VITALS — BP 128/80 | Temp 97.7°F | Ht 66.0 in | Wt 140.6 lb

## 2018-11-15 DIAGNOSIS — R6889 Other general symptoms and signs: Secondary | ICD-10-CM

## 2018-11-15 DIAGNOSIS — Z6822 Body mass index (BMI) 22.0-22.9, adult: Secondary | ICD-10-CM

## 2018-11-15 DIAGNOSIS — R0989 Other specified symptoms and signs involving the circulatory and respiratory systems: Secondary | ICD-10-CM | POA: Diagnosis not present

## 2018-11-15 MED ORDER — PHENTERMINE HCL 15 MG PO CAPS: 15.0000 mg | ORAL_CAPSULE | ORAL | 0 refills | Status: DC

## 2018-11-15 NOTE — Progress Notes (Signed)
Pt reported that her dentist started her on Amoxicillin 500 mg for 7 days.Marland KitchenMarland KitchenElouise Munroe, [place]

## 2018-11-15 NOTE — Progress Notes (Signed)
Virtual Visit via Video Note  I connected with Carol Diaz on 11/15/18 at  2:20 PM EDT by a video enabled telemedicine application and verified that I am speaking with the correct person using two identifiers.   I discussed the limitations of evaluation and management by telemedicine and the availability of in person appointments. The patient expressed understanding and agreed to proceed.    Established Patient Office Visit  Subjective:  Patient ID: Carol Diaz, female    DOB: 1965/07/07  Age: 53 y.o. MRN: [id number]  CC:  Chief Complaint  Patient presents with  . Weight Check    HPI Carol Diaz presents for abnormal weight gain-her last weight was 144 pounds on July 24.  She is currently on phentermine and has been on it for close to 4 weeks.  She is on 15 mg dose.  She is actually lost about 3-1/2 pounds.  But she is also been dealing with a hole in the top of her mouth going into her sinus cavity after having a tooth extracted.  Unfortunately it still has not healed and has been there for almost 2 months.  So next week she has an appointment with the oral surgeon for consultation in the meantime she is taking Augmentin through her dentist.    In regards to the weight loss she is actually doing well.  She says she really has not been able to exercise consistently because a lot of head movement actually causes a lot of pressure in her sinuses but she has tried to stay active and has been trying to watch what she is eating.    Past Medical History:  Diagnosis Date  . Chronic fatigue syndrome   . Hypertension   . Migraines   . Palpitations     Past Surgical History:  Procedure Laterality Date  . CESAREAN SECTION    . ivp    . LAPAROSCOPIC APPENDECTOMY N/A 05/17/2017   Procedure: DIAGNOSTIC LAPAROSCOPY APPENDECTOMY;  Surgeon: Romie Leveehomas, Alicia, MD;  Location: [facility];  Service: General;  Laterality: N/A;  . TUBAL LIGATION      Family History  Problem Relation Age of Onset   . Emphysema Mother   . Hypertension Father   . Heart disease Brother 3050       mi    Social History   Socioeconomic History  . Marital status: Married    Spouse name: Not on file  . Number of children: 2  . Years of education: Not on file  . Highest education level: Not on file  Occupational History    Employer: [employer]  Social Needs  . Financial resource strain: Not on file  . Food insecurity    Worry: Not on file    Inability: Not on file  . Transportation needs    Medical: Not on file    Non-medical: Not on file  Tobacco Use  . Smoking status: Never Smoker  . Smokeless tobacco: Never Used  Substance and Sexual Activity  . Alcohol use: No  . Drug use: No  . Sexual activity: Yes    Partners: Male    Birth control/protection: Pill  Lifestyle  . Physical activity    Days per week: Not on file    Minutes per session: Not on file  . Stress: Not on file  Relationships  . Social Musicianconnections    Talks on phone: Not on file    Gets together: Not on file    Attends religious  service: Not on file    Active member of club or organization: Not on file    Attends meetings of clubs or organizations: Not on file    Relationship status: Not on file  . Intimate partner violence    Fear of current or ex partner: Not on file    Emotionally abused: Not on file    Physically abused: Not on file    Forced sexual activity: Not on file  Other Topics Concern  . Not on file  Social History Narrative   Some exercise.     Outpatient Medications Prior to Visit  Medication Sig Dispense Refill  . acetaminophen (TYLENOL) 325 MG tablet Two tablets every 4 hours as needed for pain.  You can buy over the counter at any drug store.    . cyclobenzaprine (FLEXERIL) 10 MG tablet TAKE 1/2 TO 1 TABLET BY MOUTH THREE TIMES DAILY AS NEEDED FOR MUSCLE SPASMS 45 tablet 1  . diclofenac sodium (VOLTAREN) 1 % GEL Apply 4 g topically 4 (four) times daily. To affected joint. 100 g 11  .  DULoxetine (CYMBALTA) 30 MG capsule Take 1 capsule (30 mg total) by mouth daily. 90 capsule 1  . fluticasone (FLONASE) 50 MCG/ACT nasal spray One spray in each nostril twice a day, use left hand for right nostril, and right hand for left nostril. 48 g 3  . INTROVALE 0.15-0.03 MG tablet TAKE 1 TABLET BY MOUTH DAILY 91 tablet 0  . linaclotide (LINZESS) 290 MCG CAPS capsule TAKE 1 CAPSULE BY MOUTH DAILY 30 MINUTES BEFORE FIRST MEAL OF THE DAY 90 capsule 1  . metFORMIN (GLUCOPHAGE) 500 MG tablet Take 1 tablet (500 mg total) by mouth 2 (two) times daily with a meal. 180 tablet 1  . nabumetone (RELAFEN) 500 MG tablet TAKE 1 TABLET(500 MG) BY MOUTH AT BEDTIME AS NEEDED 90 tablet 1  . propranolol ER (INDERAL LA) 60 MG 24 hr capsule Take 1 capsule (60 mg total) by mouth daily. 90 capsule 1  . SUMAtriptan (IMITREX) 100 MG tablet TAKE 1 TABLET BY MOUTH EVERY 2 HOURS IF NEEDED FOR MIGRAINES AS DIRECTED 10 tablet 11  . topiramate (TOPAMAX) 50 MG tablet TAKE 1 TABLET(50 MG) BY MOUTH AT BEDTIME 90 tablet 3  . phentermine 15 MG capsule Take 1 capsule (15 mg total) by mouth every morning. 30 capsule 0  . amoxicillin-clavulanate (AUGMENTIN) 875-125 MG tablet Take 1 tablet by mouth 2 (two) times daily. 14 tablet 0   No facility-administered medications prior to visit.     Allergies  Allergen Reactions  . Fluoxetine Other (See Comments)    Made her tremor worse   . Lexapro [Escitalopram Oxalate] Other (See Comments)    Headache  . Morphine Nausea And Vomiting  . Sertraline Other (See Comments)    Headache     ROS Review of Systems    Objective:    Physical Exam  BP 128/80   Temp 97.7 F (36.5 C)   Ht 5\' 6"  (1.676 m)   Wt 140 lb 9.6 oz (63.8 kg)   BMI 22.69 kg/m  Wt Readings from Last 3 Encounters:  11/15/18 140 lb 9.6 oz (63.8 kg)  10/18/18 143 lb 9.6 oz (65.1 kg)  09/25/18 150 lb (68 kg)     Health Maintenance Due  Topic Date Due  . HIV Screening  03/21/1981  . MAMMOGRAM  04/27/2016   . INFLUENZA VACCINE  10/26/2018    There are no preventive care reminders to display for  this patient.  Lab Results  Component Value Date   TSH 1.60 06/07/2015   Lab Results  Component Value Date   WBC 8.3 04/29/2018   HGB 12.9 04/29/2018   HCT 38.1 04/29/2018   MCV 90.5 04/29/2018   PLT 317 04/29/2018   Lab Results  Component Value Date   NA 138 04/29/2018   K 4.0 04/29/2018   CO2 22 04/29/2018   GLUCOSE 107 (H) 04/29/2018   BUN 15 04/29/2018   CREATININE 1.00 04/29/2018   BILITOT 0.3 02/12/2018   ALKPHOS 51 09/24/2015   AST 19 02/12/2018   ALT 27 02/12/2018   PROT 6.0 (L) 02/12/2018   ALBUMIN 3.9 09/24/2015   CALCIUM 8.6 04/29/2018   Lab Results  Component Value Date   CHOL 265 (H) 08/24/2017   Lab Results  Component Value Date   HDL 47 (L) 08/24/2017   Lab Results  Component Value Date   LDLCALC 183 (H) 08/24/2017   Lab Results  Component Value Date   TRIG 193 (H) 08/24/2017   Lab Results  Component Value Date   CHOLHDL 5.6 (H) 08/24/2017   Lab Results  Component Value Date   HGBA1C 5.3 01/01/2014      Assessment & Plan:   Problem List Items Addressed This Visit    None    Visit Diagnoses    Abnormal weight    -  Primary   BMI 22.0-22.9, adult       Sinus complaint         Abnormal weight gain-she is actually doing really well on the phentermine.  Down 3-1/2 pounds.  She has not been able to exercise consistently because of the sinus issue.  Just encouraged her to try to do her stretches etc.  Did refill the medication today.  Goal weight: 130 pounds Previous weight: 144 pounds Current weight 140.6 pounds Change in weight: Down 3-1/2 pounds Medication: Phentermine 15 mg daily Nutrition change: Follow-up: 4 weeks nurse visit  Complications from oral surgery-now on Augmentin and Mucinex D to try to prevent infection until she is able to get in with oral surgeon.  Hopefully things will go well.  If she needs anything from us please let  us know.  Meds ordered this encounter  Medications  . phentermine 15 MG capsule    Sig: Take 1 capsule (15 mg total) by mouth every morning.    Dispense:  30 capsule    Refill:  0    Follow-up: Return in about 4 weeks (around 12/13/2018) for Nurse visit for weight loss. .    I discussed the assessment and treatment plan with the patient. The patient was provided an opportunity to ask questions and all were answered. The patient agreed with the plan and demonstrated an understanding of the instructions.   The patient was advised to call back or seek an in-person evaluation if the symptoms worsen or if the condition fails to improve as anticipated.   Nani Gasseratherine Metheney, MD

## 2018-12-17 ENCOUNTER — Other Ambulatory Visit: Payer: Self-pay | Admitting: Family Medicine

## 2018-12-17 DIAGNOSIS — M545 Low back pain, unspecified: Secondary | ICD-10-CM

## 2018-12-20 ENCOUNTER — Telehealth (INDEPENDENT_AMBULATORY_CARE_PROVIDER_SITE_OTHER): Payer: BC Managed Care – PPO | Admitting: Family Medicine

## 2018-12-20 ENCOUNTER — Encounter: Payer: Self-pay | Admitting: Family Medicine

## 2018-12-20 VITALS — BP 128/82 | HR 78 | Temp 97.3°F | Wt 139.2 lb

## 2018-12-20 DIAGNOSIS — R6889 Other general symptoms and signs: Secondary | ICD-10-CM | POA: Diagnosis not present

## 2018-12-20 MED ORDER — PHENTERMINE HCL 15 MG PO CAPS: 15.0000 mg | ORAL_CAPSULE | ORAL | 0 refills | Status: DC

## 2018-12-20 NOTE — Progress Notes (Signed)
Virtual Visit via Video Note  I connected with Carol Diaz on 12/20/18 at  4:00 PM EDT by a video enabled telemedicine application and verified that I am speaking with the correct person using two identifiers.   I discussed the limitations of evaluation and management by telemedicine and the availability of in person appointments. The patient expressed understanding and agreed to proceed.    Established Patient Office Visit  Subjective:  Patient ID: Carol Diaz, female    DOB: [birth year]  Age: 53 y.o. MRN: [id number]  CC:  Chief Complaint  Patient presents with  . Weight Check    HPI Carol Diaz presents for a visit for abnormal weight gain.  Last visit was about 4 weeks ago.  She is currently on phentermine 15 mg.  She lost 3-1/2 pounds at the previous visit.  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  She been on antibiotics recently.  She has noticed now they are actually planning on putting a stent in place to see if they can help it heal.  She only lost 1 pound since her last office visit.  She says she has been trying to walk twice a week but her diet has been somewhat limited because she is only been able to eat soft foods.  She is hoping that once they put the stent in she will be able to eat a little bit more normally.  She says she also plans on starting to take her daughters refit class once a week.  Past Medical History:  Diagnosis Date  . Chronic fatigue syndrome   . Hypertension   . Migraines   . Palpitations     Past Surgical History:  Procedure Laterality Date  . CESAREAN SECTION    . ivp    . LAPAROSCOPIC APPENDECTOMY N/A 05/17/2017   Procedure: DIAGNOSTIC LAPAROSCOPY APPENDECTOMY;  Surgeon: Romie Leveehomas, Alicia, MD;  Location: [facility];  Service: General;  Laterality: N/A;  . TUBAL LIGATION      Family History  Problem Relation Age of Onset  . Emphysema Mother   .  Hypertension Father   . Heart disease Brother 8050       mi    Social History   Socioeconomic History  . Marital status: Married    Spouse name: Not on file  . Number of children: 2  . Years of education: Not on file  . Highest education level: Not on file  Occupational History    Employer: [employer]  Social Needs  . Financial resource strain: Not on file  . Food insecurity    Worry: Not on file    Inability: Not on file  . Transportation needs    Medical: Not on file    Non-medical: Not on file  Tobacco Use  . Smoking status: Never Smoker  . Smokeless tobacco: Never Used  Substance and Sexual Activity  . Alcohol use: No  . Drug use: No  . Sexual activity: Yes    Partners: Male    Birth control/protection: Pill  Lifestyle  . Physical activity    Days per week: Not on file    Minutes per session: Not on file  . Stress: Not on file  Relationships  . Social Musicianconnections    Talks on phone: Not on file    Gets together: Not on file    Attends  religious service: Not on file    Active member of club or organization: Not on file    Attends meetings of clubs or organizations: Not on file    Relationship status: Not on file  . Intimate partner violence    Fear of current or ex partner: Not on file    Emotionally abused: Not on file    Physically abused: Not on file    Forced sexual activity: Not on file  Other Topics Concern  . Not on file  Social History Narrative   Some exercise.     Outpatient Medications Prior to Visit  Medication Sig Dispense Refill  . acetaminophen (TYLENOL) 325 MG tablet Two tablets every 4 hours as needed for pain.  You can buy over the counter at any drug store.    . cyclobenzaprine (FLEXERIL) 10 MG tablet TAKE 1/2 TO 1 TABLET BY MOUTH THREE TIMES DAILY AS NEEDED FOR MUSCLE SPASMS 45 tablet 1  . diclofenac sodium (VOLTAREN) 1 % GEL Apply 4 g topically 4 (four) times daily. To affected joint. 100 g 11  . DULoxetine (CYMBALTA) 30  MG capsule Take 1 capsule (30 mg total) by mouth daily. 90 capsule 1  . fluticasone (FLONASE) 50 MCG/ACT nasal spray One spray in each nostril twice a day, use left hand for right nostril, and right hand for left nostril. 48 g 3  . INTROVALE 0.15-0.03 MG tablet TAKE 1 TABLET BY MOUTH DAILY 91 tablet 0  . linaclotide (LINZESS) 290 MCG CAPS capsule TAKE 1 CAPSULE BY MOUTH DAILY 30 MINUTES BEFORE FIRST MEAL OF THE DAY 90 capsule 1  . metFORMIN (GLUCOPHAGE) 500 MG tablet Take 1 tablet (500 mg total) by mouth 2 (two) times daily with a meal. 180 tablet 1  . nabumetone (RELAFEN) 500 MG tablet TAKE 1 TABLET(500 MG) BY MOUTH AT BEDTIME AS NEEDED 90 tablet 1  . propranolol ER (INDERAL LA) 60 MG 24 hr capsule Take 1 capsule (60 mg total) by mouth daily. 90 capsule 1  . SUMAtriptan (IMITREX) 100 MG tablet TAKE 1 TABLET BY MOUTH EVERY 2 HOURS IF NEEDED FOR MIGRAINES AS DIRECTED 10 tablet 11  . topiramate (TOPAMAX) 50 MG tablet TAKE 1 TABLET(50 MG) BY MOUTH AT BEDTIME 90 tablet 3  . phentermine 15 MG capsule Take 1 capsule (15 mg total) by mouth every morning. 30 capsule 0   No facility-administered medications prior to visit.     Allergies  Allergen Reactions  . Fluoxetine Other (See Comments)    Made her tremor worse   . Lexapro [Escitalopram Oxalate] Other (See Comments)    Headache  . Morphine Nausea And Vomiting  . Sertraline Other (See Comments)    Headache     ROS Review of Systems    Objective:    Physical Exam  Constitutional: She is oriented to person, place, and time. She appears well-developed and well-nourished.  HENT:  Head: Normocephalic and atraumatic.  Eyes: Conjunctivae are normal.  Pulmonary/Chest: Effort normal.  Neurological: She is alert and oriented to person, place, and time.  Skin: Skin is dry. No pallor.  Psychiatric: She has a normal mood and affect. Her behavior is normal.  Vitals reviewed.   BP 128/82   Pulse 78   Temp (!) 97.3 F (36.3 C)   Wt 139 lb  3.2 oz (63.1 kg)   BMI 22.47 kg/m  Wt Readings from Last 3 Encounters:  12/20/18 139 lb 3.2 oz (63.1 kg)  11/15/18 140 lb 9.6 oz (63.8  kg)  10/18/18 143 lb 9.6 oz (65.1 kg)     Health Maintenance Due  Topic Date Due  . HIV Screening  03/21/1981  . MAMMOGRAM  04/27/2016  . INFLUENZA VACCINE  10/26/2018  . PAP SMEAR-Modifier  01/27/2019    There are no preventive care reminders to display for this patient.  Lab Results  Component Value Date   TSH 1.60 06/07/2015   Lab Results  Component Value Date   WBC 8.3 04/29/2018   HGB 12.9 04/29/2018   HCT 38.1 04/29/2018   MCV 90.5 04/29/2018   PLT 317 04/29/2018   Lab Results  Component Value Date   NA 138 04/29/2018   K 4.0 04/29/2018   CO2 22 04/29/2018   GLUCOSE 107 (H) 04/29/2018   BUN 15 04/29/2018   CREATININE 1.00 04/29/2018   BILITOT 0.3 02/12/2018   ALKPHOS 51 09/24/2015   AST 19 02/12/2018   ALT 27 02/12/2018   PROT 6.0 (L) 02/12/2018   ALBUMIN 3.9 09/24/2015   CALCIUM 8.6 04/29/2018   Lab Results  Component Value Date   CHOL 265 (H) 08/24/2017   Lab Results  Component Value Date   HDL 47 (L) 08/24/2017   Lab Results  Component Value Date   LDLCALC 183 (H) 08/24/2017   Lab Results  Component Value Date   TRIG 193 (H) 08/24/2017   Lab Results  Component Value Date   CHOLHDL 5.6 (H) 08/24/2017   Lab Results  Component Value Date   HGBA1C 5.3 01/01/2014      Assessment & Plan:   Problem List Items Addressed This Visit      Other   Abnormal weight - Primary    Overall she is done well though she is only lost 1 pound in the last month.  We discussed options.  We will continue phentermine for 1 more month she is going to try to increase her activity levels is a little bit limited because of her fibromyalgia so she is not able to push herself hard.  If she does then she does not feel well.  We also discussed setting some goals for this next month.   Goal weight: 130 pounds Previous weight:  140 pounds Current weight 139 pounds Change in weight: Down 1 pounds Medication: Phentermine 15 mg daily Nutrition change: Hoping to try to get more vegetables in as long as she does not have to continue with soft diet. Exercise goal: 3 days/week from 2 days/week Follow-up: 4 weeks nurse visit      Relevant Medications   phentermine 15 MG capsule      Meds ordered this encounter  Medications  . phentermine 15 MG capsule    Sig: Take 1 capsule (15 mg total) by mouth every morning.    Dispense:  30 capsule    Refill:  0    Follow-up: Return in about 4 weeks (around 01/17/2019) for Nurse visit.     I discussed the assessment and treatment plan with the patient. The patient was provided an opportunity to ask questions and all were answered. The patient agreed with the plan and demonstrated an understanding of the instructions.   The patient was advised to call back or seek an in-person evaluation if the symptoms worsen or if the condition fails to improve as anticipated.   Nani Gasser, MD

## 2018-12-20 NOTE — Assessment & Plan Note (Addendum)
Overall she is done well though she is only lost 1 pound in the last month.  We discussed options.  We will continue phentermine for 1 more month she is going to try to increase her activity levels is a little bit limited because of her fibromyalgia so she is not able to push herself hard.  If she does then she does not feel well.  We also discussed setting some goals for this next month.   Goal weight: 130 pounds Previous weight: 140 pounds Current weight 139 pounds Change in weight: Down 1 pounds Medication: Phentermine 15 mg daily Nutrition change: Hoping to try to get more vegetables in as long as she does not have to continue with soft diet. Exercise goal: 3 days/week from 2 days/week Follow-up: 4 weeks nurse visit

## 2018-12-27 ENCOUNTER — Telehealth: Payer: Self-pay | Admitting: *Deleted

## 2018-12-27 NOTE — Telephone Encounter (Signed)
error 

## 2019-01-10 ENCOUNTER — Other Ambulatory Visit: Payer: Self-pay | Admitting: Family Medicine

## 2019-01-10 DIAGNOSIS — Z1231 Encounter for screening mammogram for malignant neoplasm of breast: Secondary | ICD-10-CM

## 2019-01-15 ENCOUNTER — Telehealth: Payer: Self-pay | Admitting: Family Medicine

## 2019-01-15 NOTE — Telephone Encounter (Signed)
Error: original appt was 01/16/19 at 8:10AM Rescheduled apt for 01/20/19 at 8:30AM

## 2019-01-15 NOTE — Telephone Encounter (Signed)
Patient called at 3:13PM to reschedule her appt for 01/17/2019 at 8:10AM due to a meeting at work... rescheduled for 01/20/2019 at 8:30 AM, still kept as a [place] video visit.

## 2019-01-16 ENCOUNTER — Telehealth: Payer: BC Managed Care – PPO | Admitting: Family Medicine

## 2019-01-20 ENCOUNTER — Encounter: Payer: Self-pay | Admitting: Family Medicine

## 2019-01-20 ENCOUNTER — Telehealth (INDEPENDENT_AMBULATORY_CARE_PROVIDER_SITE_OTHER): Payer: BC Managed Care – PPO | Admitting: Family Medicine

## 2019-01-20 VITALS — BP 121/76 | HR 76 | Temp 98.2°F | Ht 66.0 in | Wt 138.8 lb

## 2019-01-20 DIAGNOSIS — Z3009 Encounter for other general counseling and advice on contraception: Secondary | ICD-10-CM

## 2019-01-20 DIAGNOSIS — R6889 Other general symptoms and signs: Secondary | ICD-10-CM | POA: Diagnosis not present

## 2019-01-20 MED ORDER — PHENTERMINE HCL 15 MG PO CAPS: 15.0000 mg | ORAL_CAPSULE | ORAL | 0 refills | Status: DC

## 2019-01-20 MED ORDER — LEVONORGEST-ETH ESTRAD 91-DAY 0.15-0.03 MG PO TABS: 1.0000 | ORAL_TABLET | Freq: Every day | ORAL | 3 refills | Status: DC

## 2019-01-20 NOTE — Assessment & Plan Note (Signed)
Down 1 more pound since last office visit.  Exercise is occasional but not consistent.  Continue to work on [facility].  Goal weight: 130 pounds Previous weight: 139 pounds Current weight 138 pounds Change in weight: Down 1 pounds Medication: Phentermine 15 mg daily Nutrition change: Hoping to try to get more vegetables in as long as she does not have to continue with soft diet. Exercise goal: 3 days/week from 2 days/week Follow-up: 4 weeks nurse visit

## 2019-01-20 NOTE — Progress Notes (Signed)
Virtual Visit via Video Note  I connected with Carol AltKathy M Bresee on 01/20/19 at  8:30 AM EDT by a video enabled telemedicine application and verified that I am speaking with the correct person using two identifiers.   I discussed the limitations of evaluation and management by telemedicine and the availability of in person appointments. The patient expressed understanding and agreed to proceed.   Established Patient Office Visit  Subjective:  Patient ID: Carol Diaz, female    DOB: Mar 26, 1966  Age: 53 y.o. MRN: [id number]  CC:  Chief Complaint  Patient presents with  . Weight Check    HPI Carol AltKathy M Deans presents for Patient doing well on appetite suppressant. She is doing a virtual visit, weight, BP, HR check. Denies problems with insomnia, heart palpitations or tremors. Satisfied with weight loss thus far and is working on [employer] diet and regular exercise.  Been trying to take her daughters exercise class a few times.  And spending time with her grandkids chasing them.  She her diet has been somewhat limited she now has an oral appliance because she has a hole in the roof of her mouth that they are trying to get to heal.  Infection is a follow-up on that today so hopefully it is improving she thinks that it is.  She has scheduled her mammogram for November 4.  He has not had her flu shot yet this year they did not offer it at school.  She would like a refill on her birth control today.  She is happy with her current regimen.  Past Medical History:  Diagnosis Date  . Chronic fatigue syndrome   . Hypertension   . Migraines   . Palpitations     Past Surgical History:  Procedure Laterality Date  . CESAREAN SECTION    . ivp    . LAPAROSCOPIC APPENDECTOMY N/A 05/17/2017   Procedure: DIAGNOSTIC LAPAROSCOPY APPENDECTOMY;  Surgeon: Romie Leveehomas, Alicia, MD;  Location: [facility];  Service: General;  Laterality: N/A;  . TUBAL LIGATION      Family History  Problem Relation Age of Onset  .  Emphysema Mother   . Hypertension Father   . Heart disease Brother 1350       mi    Social History   Socioeconomic History  . Marital status: Married    Spouse name: Not on file  . Number of children: 2  . Years of education: Not on file  . Highest education level: Not on file  Occupational History    Employer: [employer]  Social Needs  . Financial resource strain: Not on file  . Food insecurity    Worry: Not on file    Inability: Not on file  . Transportation needs    Medical: Not on file    Non-medical: Not on file  Tobacco Use  . Smoking status: Never Smoker  . Smokeless tobacco: Never Used  Substance and Sexual Activity  . Alcohol use: No  . Drug use: No  . Sexual activity: Yes    Partners: Male    Birth control/protection: Pill  Lifestyle  . Physical activity    Days per week: Not on file    Minutes per session: Not on file  . Stress: Not on file  Relationships  . Social Musicianconnections    Talks on phone: Not on file    Gets together: Not on file    Attends religious service: Not on file    Active member of club  or organization: Not on file    Attends meetings of clubs or organizations: Not on file    Relationship status: Not on file  . Intimate partner violence    Fear of current or ex partner: Not on file    Emotionally abused: Not on file    Physically abused: Not on file    Forced sexual activity: Not on file  Other Topics Concern  . Not on file  Social History Narrative   Some exercise.     Outpatient Medications Prior to Visit  Medication Sig Dispense Refill  . acetaminophen (TYLENOL) 325 MG tablet Two tablets every 4 hours as needed for pain.  You can buy over the counter at any drug store.    . cetirizine (ZYRTEC) 10 MG tablet TK 1 T PO Q 24 H    . cyclobenzaprine (FLEXERIL) 10 MG tablet TAKE 1/2 TO 1 TABLET BY MOUTH THREE TIMES DAILY AS NEEDED FOR MUSCLE SPASMS 45 tablet 1  . diclofenac sodium (VOLTAREN) 1 % GEL Apply 4 g topically 4  (four) times daily. To affected joint. 100 g 11  . DULoxetine (CYMBALTA) 30 MG capsule Take 1 capsule (30 mg total) by mouth daily. 90 capsule 1  . fluticasone (FLONASE) 50 MCG/ACT nasal spray One spray in each nostril twice a day, use left hand for right nostril, and right hand for left nostril. 48 g 3  . linaclotide (LINZESS) 290 MCG CAPS capsule TAKE 1 CAPSULE BY MOUTH DAILY 30 MINUTES BEFORE FIRST MEAL OF THE DAY 90 capsule 1  . metFORMIN (GLUCOPHAGE) 500 MG tablet Take 1 tablet (500 mg total) by mouth 2 (two) times daily with a meal. 180 tablet 1  . nabumetone (RELAFEN) 500 MG tablet TAKE 1 TABLET(500 MG) BY MOUTH AT BEDTIME AS NEEDED 90 tablet 1  . propranolol ER (INDERAL LA) 60 MG 24 hr capsule Take 1 capsule (60 mg total) by mouth daily. 90 capsule 1  . SUMAtriptan (IMITREX) 100 MG tablet TAKE 1 TABLET BY MOUTH EVERY 2 HOURS IF NEEDED FOR MIGRAINES AS DIRECTED 10 tablet 11  . topiramate (TOPAMAX) 50 MG tablet TAKE 1 TABLET(50 MG) BY MOUTH AT BEDTIME 90 tablet 3  . INTROVALE 0.15-0.03 MG tablet TAKE 1 TABLET BY MOUTH DAILY 91 tablet 0  . phentermine 15 MG capsule Take 1 capsule (15 mg total) by mouth every morning. 30 capsule 0   No facility-administered medications prior to visit.     Allergies  Allergen Reactions  . Fluoxetine Other (See Comments)    Made her tremor worse   . Lexapro [Escitalopram Oxalate] Other (See Comments)    Headache  . Morphine Nausea And Vomiting  . Sertraline Other (See Comments)    Headache     ROS Review of Systems    Objective:    Physical Exam  Constitutional: She is oriented to person, place, and time. She appears well-developed and well-nourished.  HENT:  Head: Normocephalic and atraumatic.  Eyes: Conjunctivae are normal.  Pulmonary/Chest: Effort normal.  Neurological: She is alert and oriented to person, place, and time.  Skin: Skin is dry. No pallor.  Psychiatric: She has a normal mood and affect. Her behavior is normal.  Vitals  reviewed.   BP 121/76   Pulse 76   Temp 98.2 F (36.8 C)   Ht 5\' 6"  (1.676 m)   Wt 138 lb 12.8 oz (63 kg)   BMI 22.40 kg/m  Wt Readings from Last 3 Encounters:  01/20/19 138 lb 12.8  oz (63 kg)  12/20/18 139 lb 3.2 oz (63.1 kg)  11/15/18 140 lb 9.6 oz (63.8 kg)     Health Maintenance Due  Topic Date Due  . HIV Screening  03/21/1981  . MAMMOGRAM  04/27/2016  . PAP SMEAR-Modifier  01/27/2019    There are no preventive care reminders to display for this patient.  Lab Results  Component Value Date   TSH 1.60 06/07/2015   Lab Results  Component Value Date   WBC 8.3 04/29/2018   HGB 12.9 04/29/2018   HCT 38.1 04/29/2018   MCV 90.5 04/29/2018   PLT 317 04/29/2018   Lab Results  Component Value Date   NA 138 04/29/2018   K 4.0 04/29/2018   CO2 22 04/29/2018   GLUCOSE 107 (H) 04/29/2018   BUN 15 04/29/2018   CREATININE 1.00 04/29/2018   BILITOT 0.3 02/12/2018   ALKPHOS 51 09/24/2015   AST 19 02/12/2018   ALT 27 02/12/2018   PROT 6.0 (L) 02/12/2018   ALBUMIN 3.9 09/24/2015   CALCIUM 8.6 04/29/2018   Lab Results  Component Value Date   CHOL 265 (H) 08/24/2017   Lab Results  Component Value Date   HDL 47 (L) 08/24/2017   Lab Results  Component Value Date   LDLCALC 183 (H) 08/24/2017   Lab Results  Component Value Date   TRIG 193 (H) 08/24/2017   Lab Results  Component Value Date   CHOLHDL 5.6 (H) 08/24/2017   Lab Results  Component Value Date   HGBA1C 5.3 01/01/2014      Assessment & Plan:   Problem List Items Addressed This Visit      Other   Abnormal weight    Down 1 more pound since last office visit.  Exercise is occasional but not consistent.  Continue to work on [employer].  Goal weight: 130 pounds Previous weight: 139 pounds Current weight 138 pounds Change in weight: Down 1 pounds Medication: Phentermine 15 mg daily Nutrition change: Hoping to try to get more vegetables in as long as she does not have to continue with soft  diet. Exercise goal: 3 days/week from 2 days/week Follow-up: 4 weeks nurse visit      Relevant Medications   phentermine 15 MG capsule    Other Visit Diagnoses    Counseling for birth control, oral contraceptives    -  Primary      Meds ordered this encounter  Medications  . levonorgestrel-ethinyl estradiol (INTROVALE) 0.15-0.03 MG tablet    Sig: Take 1 tablet by mouth daily.    Dispense:  91 tablet    Refill:  3  . phentermine 15 MG capsule    Sig: Take 1 capsule (15 mg total) by mouth every morning.    Dispense:  30 capsule    Refill:  0    Follow-up: Return in about 4 weeks (around 02/17/2019), or if symptoms worsen or fail to improve, for weight check, nurse visit. .     I discussed the assessment and treatment plan with the patient. The patient was provided an opportunity to ask questions and all were answered. The patient agreed with the plan and demonstrated an understanding of the instructions.   The patient was advised to call back or seek an in-person evaluation if the symptoms worsen or if the condition fails to improve as anticipated.   Nani Gasser, MD

## 2019-01-20 NOTE — Progress Notes (Signed)
Patient doing well on appetite suppressant. She is doing a virtual visit, weight, BP, HR check. Denies problems with insomnia, heart palpitations or tremors. Satisfied with weight loss thus far and is working on [employer] and regular exercise.  Carol Diaz, Lahoma Crocker, CMA

## 2019-01-29 ENCOUNTER — Ambulatory Visit (INDEPENDENT_AMBULATORY_CARE_PROVIDER_SITE_OTHER): Payer: BC Managed Care – PPO

## 2019-01-29 ENCOUNTER — Other Ambulatory Visit: Payer: Self-pay

## 2019-01-29 DIAGNOSIS — Z1231 Encounter for screening mammogram for malignant neoplasm of breast: Secondary | ICD-10-CM | POA: Diagnosis not present

## 2019-02-10 IMAGING — CT CT ABD-PELV W/ CM
2 of 5 series · 16 of 46 positions shown, 18 images · IV contrast (APPLIED)
Comparison: None.

CLINICAL DATA: Acute right lower quadrant abdominal pain.

EXAM:
CT ABDOMEN AND PELVIS WITH CONTRAST
TECHNIQUE: Multidetector CT imaging of the abdomen and pelvis was performed
using the standard protocol following bolus administration of
intravenous contrast.
CONTRAST:  100mL JAQLKY-DQQ IOPAMIDOL (JAQLKY-DQQ) INJECTION 61%

[Series 2: axial st · axial · 0.80mm/px · z∈[-430,-15]mm · 13 of 93 slices shown, 15 images]
[im 5/93  soft-tissue]
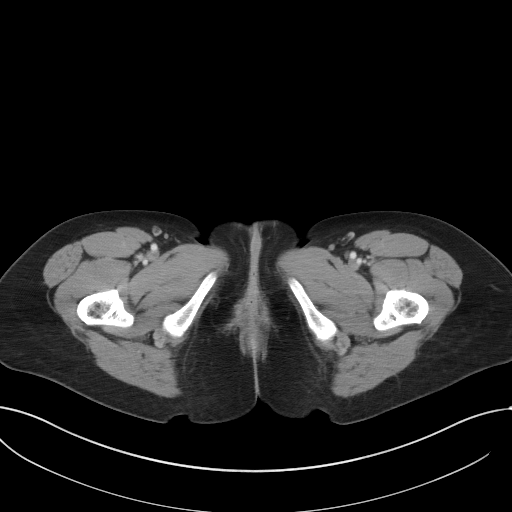
[im 5/93  bone]
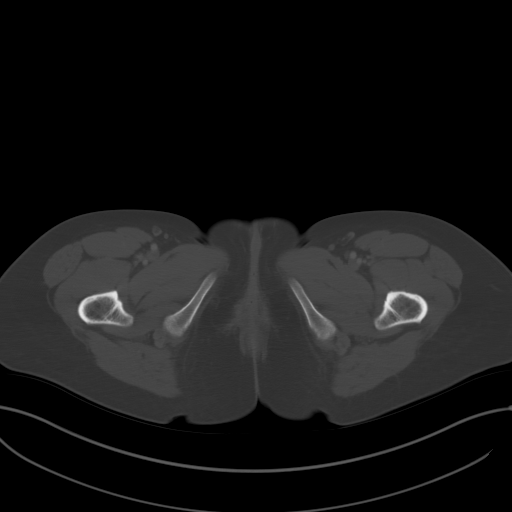
[im 15/93  soft-tissue]
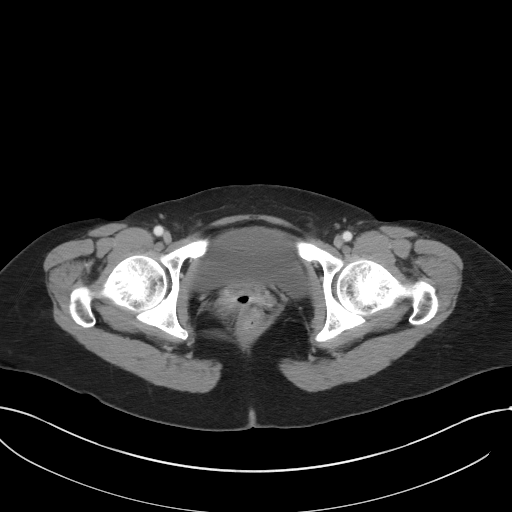
[im 20/93  soft-tissue]
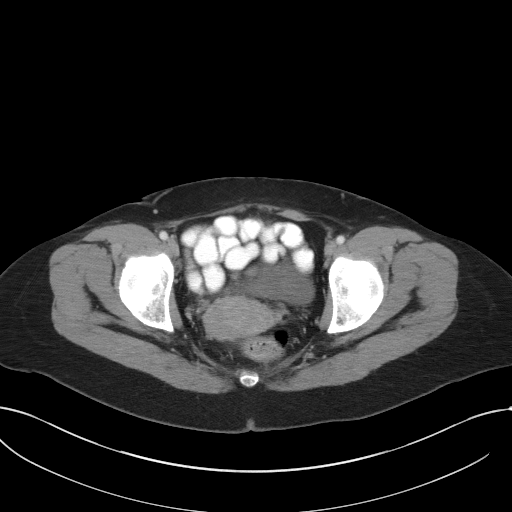
[im 25/93  soft-tissue]
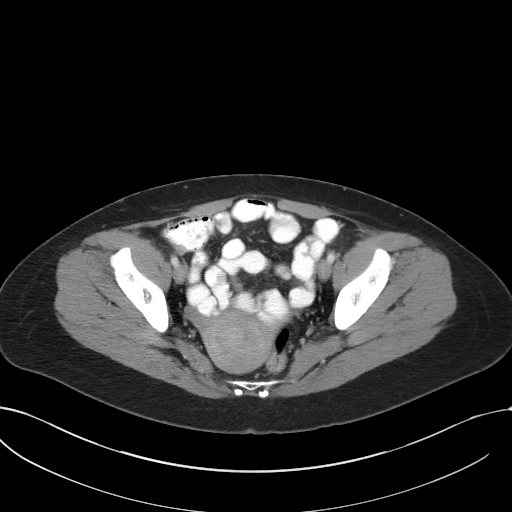
[im 34/93  soft-tissue]
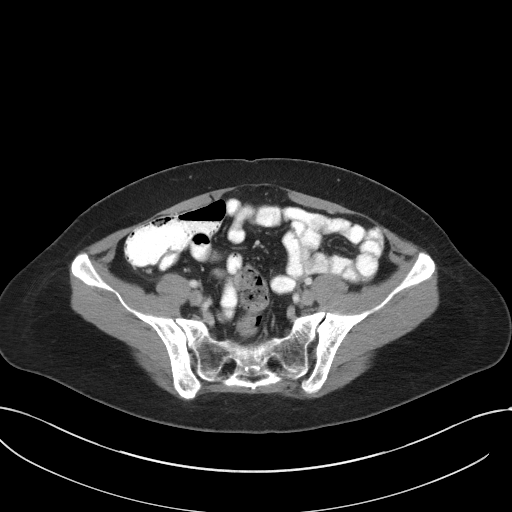
[im 39/93  soft-tissue]
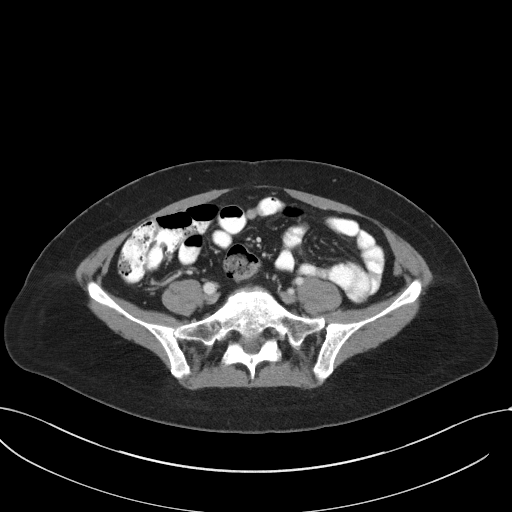
[im 49/93  soft-tissue]
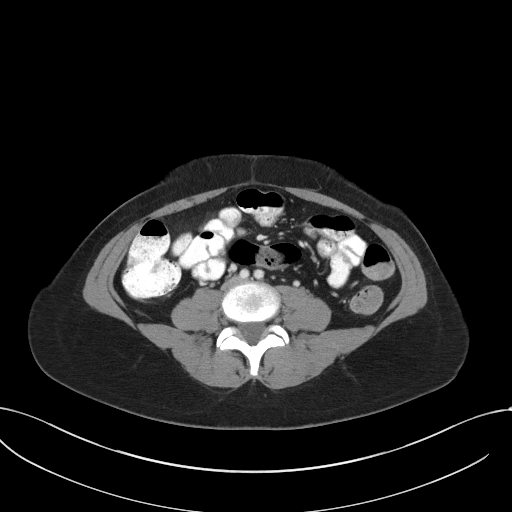
[im 54/93  soft-tissue]
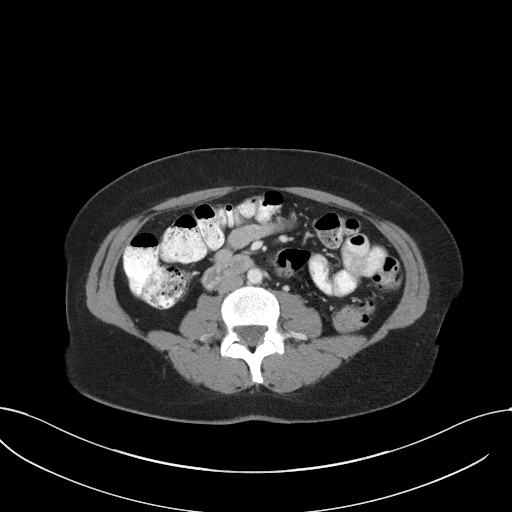
[im 59/93  soft-tissue]
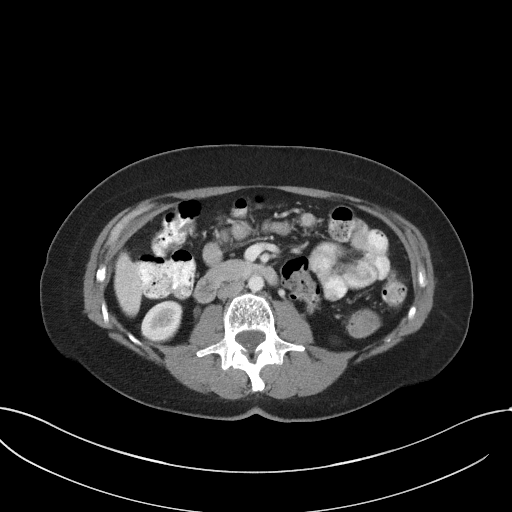
[im 59/93  bone]
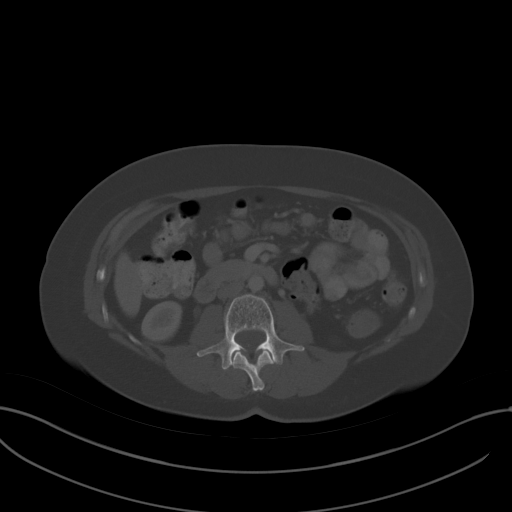
[im 68/93  soft-tissue]
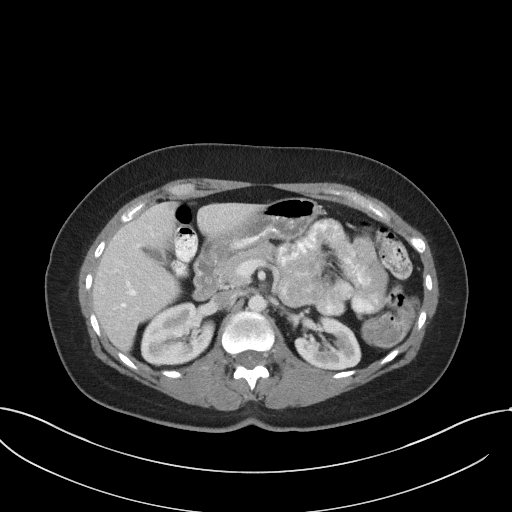
[im 73/93  soft-tissue]
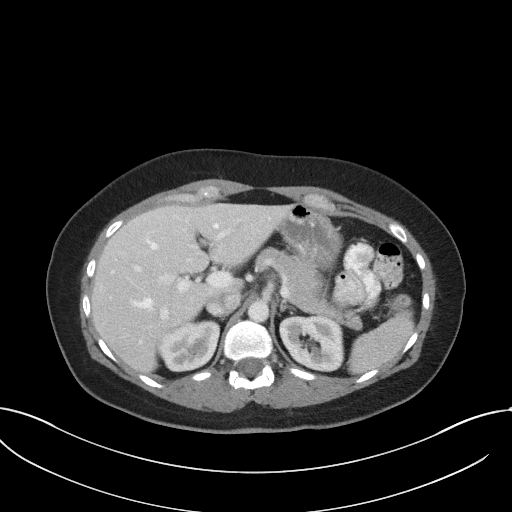
[im 78/93  soft-tissue]
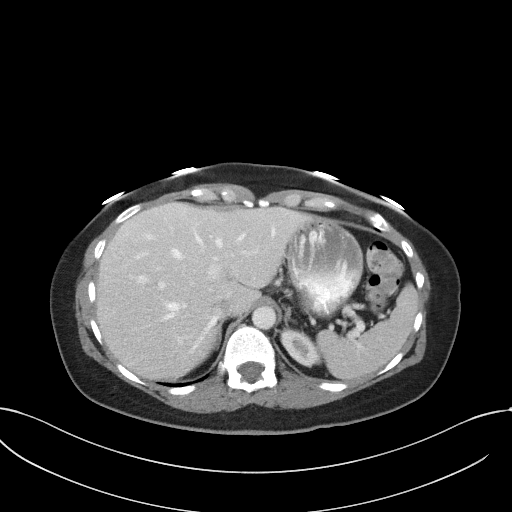
[im 88/93  soft-tissue]
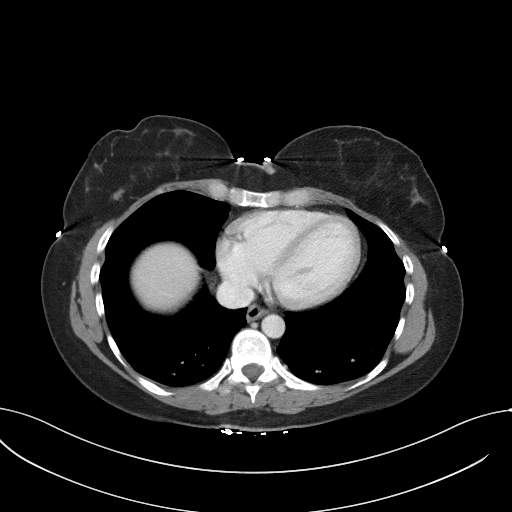

[Series 5: coronal st · coronal · 0.72mm/px · 3 of 81 slices shown]
[im 27/81  soft-tissue]
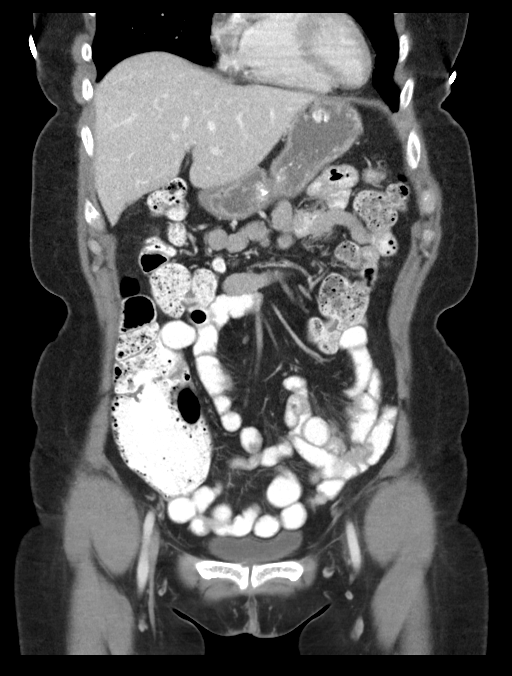
[im 36/81  soft-tissue]
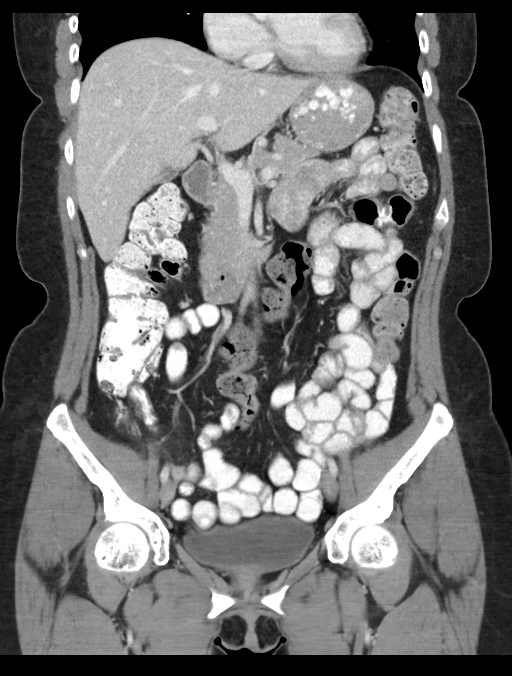
[im 45/81  soft-tissue]
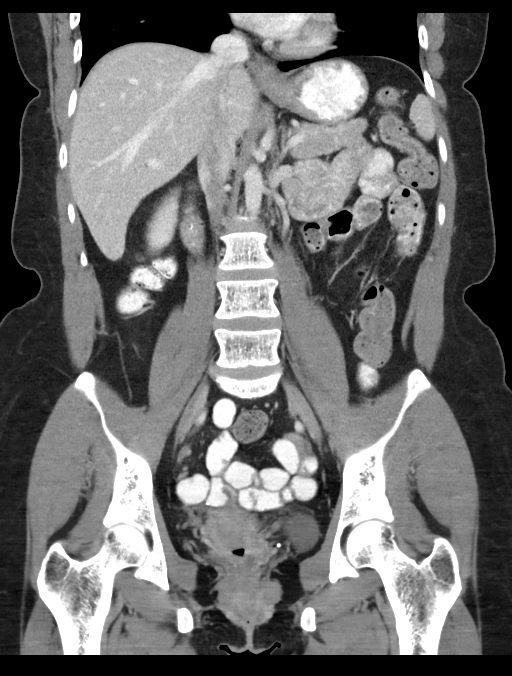

[16 of 46 positions shown; findings below may reference images not displayed]

FINDINGS: Lower chest: No acute abnormality.

Hepatobiliary: No focal liver abnormality is seen. No gallstones,
gallbladder wall thickening, or biliary dilatation.

Pancreas: Unremarkable. No pancreatic ductal dilatation or
surrounding inflammatory changes.

Spleen: Normal in size without focal abnormality.

Adrenals/Urinary Tract: Adrenal glands are unremarkable. Kidneys are
normal, without renal calculi, focal lesion, or hydronephrosis.
Bladder is unremarkable.

Stomach/Bowel: The stomach appears normal. There is no evidence of
bowel obstruction. The appendix is thick-walled and enlarged at 9
mm, although no significant surrounding inflammation is noted. It is
retrocecal in position.

Vascular/Lymphatic: No significant vascular findings are present. No
enlarged abdominal or pelvic lymph nodes.

Reproductive: Uterus and bilateral adnexa are unremarkable.

Other: No abdominal wall hernia or abnormality. No abdominopelvic
ascites.

Musculoskeletal: No acute or significant osseous findings.
IMPRESSION: The appendix is thick-walled and enlarged at 9 mm in diameter,
although no surrounding inflammation is noted. These findings are
equivocal for acute appendicitis, and correlation with laboratory
and clinical findings is recommended.

## 2019-02-17 ENCOUNTER — Other Ambulatory Visit: Payer: Self-pay | Admitting: Family Medicine

## 2019-02-17 ENCOUNTER — Other Ambulatory Visit: Payer: Self-pay

## 2019-02-17 ENCOUNTER — Encounter: Payer: Self-pay | Admitting: Family Medicine

## 2019-02-17 ENCOUNTER — Telehealth (INDEPENDENT_AMBULATORY_CARE_PROVIDER_SITE_OTHER): Payer: BC Managed Care – PPO | Admitting: Family Medicine

## 2019-02-17 DIAGNOSIS — R6889 Other general symptoms and signs: Secondary | ICD-10-CM | POA: Diagnosis not present

## 2019-02-17 DIAGNOSIS — M797 Fibromyalgia: Secondary | ICD-10-CM | POA: Diagnosis not present

## 2019-02-17 MED ORDER — PHENTERMINE HCL 15 MG PO CAPS: 15.0000 mg | ORAL_CAPSULE | ORAL | 0 refills | Status: DC

## 2019-02-17 NOTE — Progress Notes (Signed)
Virtual Visit via Video Note  I connected with Carol Diaz on 02/17/19 at  2:20 PM EST by a video enabled telemedicine application and verified that I am speaking with the correct person using two identifiers.   I discussed the limitations of evaluation and management by telemedicine and the availability of in person appointments. The patient expressed understanding and agreed to proceed.  Subjective:    CC: Fibromyalgia  And weight gain.    HPI: F/U fibromyalgia -overall she has been struggling a little bit more with her fibromyalgia.  She says since the weather got a little colder she is had a little bit more pain particularly in her neck and upper shoulders.  She uses her Flexeril occasionally.  She has been trying to consistently do her stretches.  She is currently on Cymbalta 30 mg.  Abnormal weight gain -says her weight has still plateaued at 138 pounds she cannot seem to get past that but she has not gained any weight.  She says she really feels like she is doing good with her dietary choices.  No side effects of the medication.  No chest pain, shortness of breath, palpitations.  She is not currently doing a calorie counter.   Past medical history, Surgical history, Family history not pertinant except as noted below, Social history, Allergies, and medications have been entered into the medical record, reviewed, and corrections made.   Review of Systems: No fevers, chills, night sweats, weight loss, chest pain, or shortness of breath.   Objective:    General: Speaking clearly in complete sentences without any shortness of breath.  Alert and oriented x3.  Normal judgment. No apparent acute distress.    Impression and Recommendations:    Abnormal weight gain -discussed options including working with a nutritionist.  She said she would definitely be open to that maybe after the holidays in the meantime she says she thinks she will try using my fitness pal which she has done in the  past I think that would be a great idea and would help her at least maintain.  We will go ahead and refill phentermine for 1 more month for now.   Fibromyalgia -discussed options including increasing her Cymbalta if needed.  She said for now we will stick with the 30 mg and use her Flexeril as needed.  Encouraged her to make sure that she is getting adequate sleep and rest and hydration and continue with her regular stretches.   I discussed the assessment and treatment plan with the patient. The patient was provided an opportunity to ask questions and all were answered. The patient agreed with the plan and demonstrated an understanding of the instructions.   The patient was advised to call back or seek an in-person evaluation if the symptoms worsen or if the condition fails to improve as anticipated.   Beatrice Lecher, MD

## 2019-02-19 ENCOUNTER — Ambulatory Visit: Payer: BC Managed Care – PPO

## 2019-03-15 ENCOUNTER — Other Ambulatory Visit: Payer: Self-pay | Admitting: Family Medicine

## 2019-03-17 ENCOUNTER — Other Ambulatory Visit: Payer: Self-pay | Admitting: *Deleted

## 2019-03-17 ENCOUNTER — Other Ambulatory Visit: Payer: Self-pay | Admitting: Family Medicine

## 2019-03-17 DIAGNOSIS — M545 Low back pain, unspecified: Secondary | ICD-10-CM

## 2019-03-17 DIAGNOSIS — R6889 Other general symptoms and signs: Secondary | ICD-10-CM

## 2019-03-17 MED ORDER — PHENTERMINE HCL 15 MG PO CAPS: 15.0000 mg | ORAL_CAPSULE | ORAL | 0 refills | Status: DC

## 2019-03-17 MED ORDER — CYCLOBENZAPRINE HCL 10 MG PO TABS: ORAL_TABLET | ORAL | 1 refills | Status: DC

## 2019-03-17 NOTE — Telephone Encounter (Signed)
RX last written 11/23  Weight check scheduled 03/24/19  RX pended

## 2019-03-24 ENCOUNTER — Telehealth: Payer: BC Managed Care – PPO | Admitting: Family Medicine

## 2019-03-27 ENCOUNTER — Telehealth (INDEPENDENT_AMBULATORY_CARE_PROVIDER_SITE_OTHER): Payer: BC Managed Care – PPO | Admitting: Family Medicine

## 2019-03-27 ENCOUNTER — Encounter: Payer: Self-pay | Admitting: Family Medicine

## 2019-03-27 VITALS — Ht 66.0 in | Wt 138.0 lb

## 2019-03-27 DIAGNOSIS — R6889 Other general symptoms and signs: Secondary | ICD-10-CM

## 2019-03-27 DIAGNOSIS — R635 Abnormal weight gain: Secondary | ICD-10-CM

## 2019-03-27 DIAGNOSIS — R040 Epistaxis: Secondary | ICD-10-CM | POA: Diagnosis not present

## 2019-03-27 DIAGNOSIS — M542 Cervicalgia: Secondary | ICD-10-CM

## 2019-03-27 DIAGNOSIS — M797 Fibromyalgia: Secondary | ICD-10-CM

## 2019-03-27 MED ORDER — DULOXETINE HCL 60 MG PO CPEP: 60.0000 mg | ORAL_CAPSULE | Freq: Every day | ORAL | 2 refills | Status: DC

## 2019-03-27 MED ORDER — PHENTERMINE HCL 37.5 MG PO CAPS: 37.5000 mg | ORAL_CAPSULE | ORAL | 0 refills | Status: DC

## 2019-03-27 NOTE — Progress Notes (Signed)
Virtual Visit via Video Note  I connected with Carol Diaz on 03/27/19 at  2:20 PM EST by a video enabled telemedicine application and verified that I am speaking with the correct person using two identifiers.   I discussed the limitations of evaluation and management by telemedicine and the availability of in person appointments. The patient expressed understanding and agreed to proceed.  Subjective:    CC: F/U Fibromyalgia  HPI:  Follow-up fibromyalgia-she does feel like her symptoms have been a little bit worse.  She had mentioned at the last office visit.  But we were going to see how she did over time but she feels like it is getting progressively worse.  She feels like her stress levels are actually really good right now she is on a break from work as she works for one of the [employer].  She has been taking her half a tab of cyclobenzaprine at bedtime and still having a lot of muscle aches and pains particularly in her neck shoulders and upper chest area.  She is also felt more fatigued.  Abnormal weight gain-over the holidays she was able to maintain her weight she did not lose.  She would like to consider increasing her phentermine if possible she has previously taken the 37.5 mg and did well with it did not have any chest pain or heart flutters.  She still having some problems on and off with her sinuses.  She unfortunately had a hole in the roof of her mouth that has been healing and she has been following with her dentist.  In fact we had actually treated her for some sinus infection about 3 weeks ago.  She occasionally just notices some blood.   Past medical history, Surgical history, Family history not pertinant except as noted below, Social history, Allergies, and medications have been entered into the medical record, reviewed, and corrections made.   Review of Systems: No fevers, chills, night sweats, weight loss, chest pain, or shortness of breath.    Objective:    General: Speaking clearly in complete sentences without any shortness of breath.  Alert and oriented x3.  Normal judgment. No apparent acute distress.    Impression and Recommendations:    Fibromyalgia We discussed options.  We will try increasing her Cymbalta to 60 mg.  Plan to follow-up in 2 to 3 months.  10 you work on keeping stress levels under control and trying to get adequate sleep at night.  Okay to continue half a tab of Flexeril at bedtime as well.  Abnormal weight We will increase phentermine to 37.5 mg for 30 days.  Will need to follow-up for nurse visit in 1 month.  Blood from the nose-we discussed running a humidifier at night to help keep the nasal passages moisturized.  If she feels like it is getting worse then please let us know.      I discussed the assessment and treatment plan with the patient. The patient was provided an opportunity to ask questions and all were answered. The patient agreed with the plan and demonstrated an understanding of the instructions.   The patient was advised to call back or seek an in-person evaluation if the symptoms worsen or if the condition fails to improve as anticipated.   Beatrice Lecher, MD

## 2019-03-27 NOTE — Assessment & Plan Note (Signed)
We discussed options.  We will try increasing her Cymbalta to 60 mg.  Plan to follow-up in 2 to 3 months.  10 you work on keeping stress levels under control and trying to get adequate sleep at night.  Okay to continue half a tab of Flexeril at bedtime as well.

## 2019-03-27 NOTE — Assessment & Plan Note (Signed)
We will increase phentermine to 37.5 mg for 30 days.  Will need to follow-up for nurse visit in 1 month.

## 2019-03-27 NOTE — Progress Notes (Signed)
Pt reports that she would like an increase of the cymbalta due to having more fibromyalgia flares.

## 2019-04-26 ENCOUNTER — Other Ambulatory Visit: Payer: Self-pay | Admitting: Family Medicine

## 2019-04-26 DIAGNOSIS — R6889 Other general symptoms and signs: Secondary | ICD-10-CM

## 2019-04-28 NOTE — Telephone Encounter (Signed)
Called patient and notified she needs to make appointment to get this medication refilled. Patient agrees and sent to front office to schedule nurse visit. KG LPN

## 2019-04-28 NOTE — Telephone Encounter (Signed)
Has to have a nurse visit for weight check before refilling phentermine.

## 2019-05-07 ENCOUNTER — Ambulatory Visit: Payer: BC Managed Care – PPO

## 2019-05-07 ENCOUNTER — Telehealth: Payer: BC Managed Care – PPO | Admitting: Family Medicine

## 2019-05-10 ENCOUNTER — Other Ambulatory Visit: Payer: Self-pay | Admitting: Family Medicine

## 2019-05-27 ENCOUNTER — Other Ambulatory Visit: Payer: Self-pay | Admitting: Family Medicine

## 2019-05-30 ENCOUNTER — Ambulatory Visit: Payer: BC Managed Care – PPO | Admitting: Family Medicine

## 2019-05-30 ENCOUNTER — Encounter: Payer: Self-pay | Admitting: Family Medicine

## 2019-05-30 ENCOUNTER — Other Ambulatory Visit: Payer: Self-pay

## 2019-05-30 VITALS — BP 128/81 | HR 81 | Ht 66.0 in | Wt 156.0 lb

## 2019-05-30 DIAGNOSIS — T50Z95A Adverse effect of other vaccines and biological substances, initial encounter: Secondary | ICD-10-CM | POA: Diagnosis not present

## 2019-05-30 DIAGNOSIS — R52 Pain, unspecified: Secondary | ICD-10-CM | POA: Diagnosis not present

## 2019-05-30 DIAGNOSIS — G43009 Migraine without aura, not intractable, without status migrainosus: Secondary | ICD-10-CM | POA: Diagnosis not present

## 2019-05-30 MED ORDER — KETOROLAC TROMETHAMINE 60 MG/2ML IM SOLN
60.0000 mg | Freq: Once | INTRAMUSCULAR | Status: AC
  Administered 2019-05-30: 60 mg via INTRAMUSCULAR

## 2019-05-30 NOTE — Progress Notes (Signed)
Established Patient Office Visit  Subjective:  Patient ID: Carol Diaz, female    DOB: [birth year]  Age: 54 y.o. MRN: [id number]  CC:  Chief Complaint  Patient presents with  . Headache    HPI ARNETTE DRIGGS presents for Headaches.    Carol Diaz says Carol Diaz received her first Covid vaccine on Saturday morning and by about 8 PM that night Carol Diaz started to not feel well Carol Diaz started to develop some body aches and fatigue.  The next day Carol Diaz ran a low-grade temperature.  The muscle aches were quite severe at least initially and Carol Diaz had severe mid back pain in the bra strap area.  The back pain has gradually improved and the body aches are not as severe but they are still present.  Carol Diaz is also had more frequent headaches.  Carol Diaz said Carol Diaz felt a little better on Wednesday and even went to work Thursday but by midday Thursday had to go home.  And then missed today as well Carol Diaz has been using Tylenol and then tried her Imitrex today which did help but Carol Diaz says when it wears off it seems to come back.    Past Medical History:  Diagnosis Date  . Chronic fatigue syndrome   . Hypertension   . Migraines   . Palpitations     Past Surgical History:  Procedure Laterality Date  . CESAREAN SECTION    . ivp    . LAPAROSCOPIC APPENDECTOMY N/A 05/17/2017   Procedure: DIAGNOSTIC LAPAROSCOPY APPENDECTOMY;  Surgeon: Romie Levee, MD;  Location: [facility];  Service: General;  Laterality: N/A;  . TUBAL LIGATION      Family History  Problem Relation Age of Onset  . Emphysema Mother   . Hypertension Father   . Heart disease Brother 25       mi    Social History   Socioeconomic History  . Marital status: Married    Spouse name: Not on file  . Number of children: 2  . Years of education: Not on file  . Highest education level: Not on file  Occupational History    Employer: [employer]  Tobacco Use  . Smoking status: Never Smoker  . Smokeless tobacco: Never Used  Substance and Sexual Activity   . Alcohol use: No  . Drug use: No  . Sexual activity: Yes    Partners: Male    Birth control/protection: Pill  Other Topics Concern  . Not on file  Social History Narrative   Some exercise.    Social Determinants of Health   Financial Resource Strain:   . Difficulty of Paying Living Expenses: Not on file  Food Insecurity:   . Worried About Programme researcher, broadcasting/film/video in the Last Year: Not on file  . Ran Out of Food in the Last Year: Not on file  Transportation Needs:   . Lack of Transportation (Medical): Not on file  . Lack of Transportation (Non-Medical): Not on file  Physical Activity:   . Days of Exercise per Week: Not on file  . Minutes of Exercise per Session: Not on file  Stress:   . Feeling of Stress : Not on file  Social Connections:   . Frequency of Communication with Friends and Family: Not on file  . Frequency of Social Gatherings with Friends and Family: Not on file  . Attends Religious Services: Not on file  . Active Member of Clubs or Organizations: Not on file  . Attends Banker Meetings:  Not on file  . Marital Status: Not on file  Intimate Partner Violence:   . Fear of Current or Ex-Partner: Not on file  . Emotionally Abused: Not on file  . Physically Abused: Not on file  . Sexually Abused: Not on file    Outpatient Medications Prior to Visit  Medication Sig Dispense Refill  . acetaminophen (TYLENOL) 325 MG tablet Two tablets every 4 hours as needed for pain.  You can buy over the counter at any drug store.    . cetirizine (ZYRTEC) 10 MG tablet TK 1 T PO Q 24 H    . cyclobenzaprine (FLEXERIL) 10 MG tablet TAKE 1/2 TO 1 TABLET BY MOUTH THREE TIMES DAILY AS NEEDED FOR MUSCLE SPASMS 45 tablet 1  . diclofenac sodium (VOLTAREN) 1 % GEL Apply 4 g topically 4 (four) times daily. To affected joint. 100 g 11  . DULoxetine (CYMBALTA) 60 MG capsule Take 1 capsule (60 mg total) by mouth daily. 30 capsule 2  . fluticasone (FLONASE) 50 MCG/ACT nasal spray One  spray in each nostril twice a day, use left hand for right nostril, and right hand for left nostril. 48 g 3  . levonorgestrel-ethinyl estradiol (INTROVALE) 0.15-0.03 MG tablet Take 1 tablet by mouth daily. 91 tablet 3  . LINZESS 290 MCG CAPS capsule TAKE ONE CAPSULE BY MOUTH DAILY, 30 MINUTES BEFORE FIRST MEAL OF THE DAY 90 capsule 1  . metFORMIN (GLUCOPHAGE) 500 MG tablet TAKE 1 TABLET BY MOUTH TWICE DAILY WITH A MEAL 180 tablet 1  . nabumetone (RELAFEN) 500 MG tablet TAKE 1 TABLET BY MOUTH AT BEDTIME AS NEEDED 90 tablet 1  . propranolol ER (INDERAL LA) 60 MG 24 hr capsule TAKE ONE CAPSULE BY MOUTH EVERY DAY 90 capsule 1  . SUMAtriptan (IMITREX) 100 MG tablet TAKE 1 TABLET BY MOUTH EVERY 2 HOURS IF NEEDED FOR MIGRAINES AS DIRECTED 10 tablet 11  . topiramate (TOPAMAX) 50 MG tablet TAKE 1 TABLET(50 MG) BY MOUTH AT BEDTIME 90 tablet 3  . phentermine 37.5 MG capsule Take 1 capsule (37.5 mg total) by mouth every morning. 30 capsule 0   No facility-administered medications prior to visit.    Allergies  Allergen Reactions  . Fluoxetine Other (See Comments)    Made her tremor worse   . Lexapro [Escitalopram Oxalate] Other (See Comments)    Headache  . Morphine Nausea And Vomiting  . Sertraline Other (See Comments)    Headache     ROS Review of Systems    Objective:    Physical Exam  Constitutional: Carol Diaz is oriented to person, place, and time. Carol Diaz appears well-developed and well-nourished.  HENT:  Head: Normocephalic and atraumatic.  Right Ear: External ear normal.  Left Ear: External ear normal.  Nose: Nose normal.  Mouth/Throat: Oropharynx is clear and moist.  TMs and canals are clear.   Eyes: Pupils are equal, round, and reactive to light. Conjunctivae and EOM are normal.  Neck: No thyromegaly present.  Cardiovascular: Normal rate, regular rhythm and normal heart sounds.  Pulmonary/Chest: Effort normal and breath sounds normal. Carol Diaz has no wheezes.  Abdominal: Soft. Bowel sounds  are normal.  Musculoskeletal:     Cervical back: Neck supple.  Lymphadenopathy:    Carol Diaz has no cervical adenopathy.  Neurological: Carol Diaz is alert and oriented to person, place, and time.  Skin: Skin is warm and dry.  Psychiatric: Carol Diaz has a normal mood and affect.    BP 128/81   Pulse 81   Ht  5\' 6"  (1.676 m)   Wt 156 lb (70.8 kg)   SpO2 100%   BMI 25.18 kg/m  Wt Readings from Last 3 Encounters:  05/30/19 156 lb (70.8 kg)  03/27/19 138 lb (62.6 kg)  01/20/19 138 lb 12.8 oz (63 kg)     There are no preventive care reminders to display for this patient.  There are no preventive care reminders to display for this patient.  Lab Results  Component Value Date   TSH 1.60 06/07/2015   Lab Results  Component Value Date   WBC 8.3 04/29/2018   HGB 12.9 04/29/2018   HCT 38.1 04/29/2018   MCV 90.5 04/29/2018   PLT 317 04/29/2018   Lab Results  Component Value Date   NA 138 04/29/2018   K 4.0 04/29/2018   CO2 22 04/29/2018   GLUCOSE 107 (H) 04/29/2018   BUN 15 04/29/2018   CREATININE 1.00 04/29/2018   BILITOT 0.3 02/12/2018   ALKPHOS 51 09/24/2015   AST 19 02/12/2018   ALT 27 02/12/2018   PROT 6.0 (L) 02/12/2018   ALBUMIN 3.9 09/24/2015   CALCIUM 8.6 04/29/2018   Lab Results  Component Value Date   CHOL 265 (H) 08/24/2017   Lab Results  Component Value Date   HDL 47 (L) 08/24/2017   Lab Results  Component Value Date   LDLCALC 183 (H) 08/24/2017   Lab Results  Component Value Date   TRIG 193 (H) 08/24/2017   Lab Results  Component Value Date   CHOLHDL 5.6 (H) 08/24/2017   Lab Results  Component Value Date   HGBA1C 5.3 01/01/2014      Assessment & Plan:   Problem List Items Addressed This Visit      Cardiovascular and Mediastinum   Migraine headache - Primary    Unfortunately, it sounds like the Covid vaccine did trigger her migraines.  Were going to give her a Toradol injection today.  We discussed avoiding prednisone because I do not want to blunt  her response to the vaccine itself.  Okay to use Imitrex.  Recommend that Carol Diaz just rest and hydrate well this weekend.  Okay to use ice.       Other Visit Diagnoses    Body aches       Vaccine reaction, initial encounter           Vaccination reaction-unfortunately I think her symptoms are related to side effects from the vaccine.  No indication of any type of allergic reaction.  Typical muscle aches headaches and back pain that has been seen.  The more often with the second vaccination.  I did let her know that Carol Diaz could have very similar symptoms the second time around but they were not necessarily be worse.  Work note provided for the days that Carol Diaz missed.  Just encourage her to hydrate and rest this weekend.  It does sound like her body aches are not nearly as severe and the back pain has resolved which is good.  I do think this probably flared her fibromyalgia.  As well as triggering her migraines.   Meds ordered this encounter  Medications  . ketorolac (TORADOL) injection 60 mg    Follow-up: Return if symptoms worsen or fail to improve.    03/03/2014, MD

## 2019-05-30 NOTE — Assessment & Plan Note (Signed)
Unfortunately, it sounds like the Covid vaccine did trigger her migraines.  Were going to give her a Toradol injection today.  We discussed avoiding prednisone because I do not want to blunt her response to the vaccine itself.  Okay to use Imitrex.  Recommend that she just rest and hydrate well this weekend.  Okay to use ice.

## 2019-06-04 ENCOUNTER — Encounter: Payer: Self-pay | Admitting: Family Medicine

## 2019-06-04 ENCOUNTER — Other Ambulatory Visit: Payer: Self-pay

## 2019-06-04 ENCOUNTER — Ambulatory Visit (INDEPENDENT_AMBULATORY_CARE_PROVIDER_SITE_OTHER): Payer: BC Managed Care – PPO | Admitting: Family Medicine

## 2019-06-04 VITALS — BP 124/59 | HR 76 | Ht 66.0 in | Wt 156.0 lb

## 2019-06-04 DIAGNOSIS — K121 Other forms of stomatitis: Secondary | ICD-10-CM

## 2019-06-04 DIAGNOSIS — G43009 Migraine without aura, not intractable, without status migrainosus: Secondary | ICD-10-CM | POA: Diagnosis not present

## 2019-06-04 DIAGNOSIS — K130 Diseases of lips: Secondary | ICD-10-CM | POA: Diagnosis not present

## 2019-06-04 DIAGNOSIS — R21 Rash and other nonspecific skin eruption: Secondary | ICD-10-CM

## 2019-06-04 DIAGNOSIS — R230 Cyanosis: Secondary | ICD-10-CM

## 2019-06-04 DIAGNOSIS — R197 Diarrhea, unspecified: Secondary | ICD-10-CM

## 2019-06-04 MED ORDER — PREDNISONE 10 MG PO TABS: ORAL_TABLET | ORAL | 0 refills | Status: DC

## 2019-06-04 NOTE — Progress Notes (Signed)
She states that the headache is about the same. She did get some relief after receiving the Toradol injection.   She has had diarrhea x3 days. Last episode was this morning.

## 2019-06-04 NOTE — Progress Notes (Signed)
Acute Office Visit  Subjective:    Patient ID: Carol Diaz, female    DOB: 1965/04/12, 54 y.o.   MRN: [id number]  Chief Complaint  Patient presents with  . Migraine    HPI Patient is in today for persistent migraine headache.  I actually just saw her about 5 days ago her status migrainous.  She had actually received the Covid vaccine on Saturday morning and by that evening started to feel achy and fatigued and gradually started to develop a headache over the next couple of days.  When I saw her I felt like the Covid vaccine may have actually triggered her migraines we gave her Toradol injection.  She did get some temporary relief but just did not improve consider consistently.  Since then she has had a few new developments.  She says her lips are now dry and peeling she is had a couple of mucosal mouth ulcers pop up.  She has had them before but not frequently.  She is also had significantly decreased appetite as well as some nausea but no vomiting. reports that she started having some diarrhea about 3 days ago.  This is really unusual for her as she normally battles chronic constipation.  She is not having any facial pain or pressure or nasal congestion.  Reports that the body aches have actually gotten a little bit better.  She denies any sinus congestion or facial pressure or pain.  Now most of the headaches are in the back of her head.  She also says that her daughter noticed that her face particularly over her facial cheeks have been looking more red this week.  And she said a couple times she is noticed that her toes have looked blue.  Just feels like something is not right.  She does have muscle cramps at night which she takes her muscle relaxer every night.  She also complains of increased sensitivity of her skin particularly in her arms and legs.  Past Medical History:  Diagnosis Date  . Chronic fatigue syndrome   . Hypertension   . Migraines   . Palpitations     Past Surgical  History:  Procedure Laterality Date  . CESAREAN SECTION    . ivp    . LAPAROSCOPIC APPENDECTOMY N/A 05/17/2017   Procedure: DIAGNOSTIC LAPAROSCOPY APPENDECTOMY;  Surgeon: Leighton Ruff, MD;  Location: [facility];  Service: General;  Laterality: N/A;  . TUBAL LIGATION      Family History  Problem Relation Age of Onset  . Emphysema Mother   . Hypertension Father   . Heart disease Brother 48       mi    Social History   Socioeconomic History  . Marital status: Married    Spouse name: Not on file  . Number of children: 2  . Years of education: Not on file  . Highest education level: Not on file  Occupational History    Employer: [employer]  Tobacco Use  . Smoking status: Never Smoker  . Smokeless tobacco: Never Used  Substance and Sexual Activity  . Alcohol use: No  . Drug use: No  . Sexual activity: Yes    Partners: Male    Birth control/protection: Pill  Other Topics Concern  . Not on file  Social History Narrative   Some exercise.    Social Determinants of Health   Financial Resource Strain:   . Difficulty of Paying Living Expenses: Not on file  Food Insecurity:   .  Worried About Charity fundraiser in the Last Year: Not on file  . Ran Out of Food in the Last Year: Not on file  Transportation Needs:   . Lack of Transportation (Medical): Not on file  . Lack of Transportation (Non-Medical): Not on file  Physical Activity:   . Days of Exercise per Week: Not on file  . Minutes of Exercise per Session: Not on file  Stress:   . Feeling of Stress : Not on file  Social Connections:   . Frequency of Communication with Friends and Family: Not on file  . Frequency of Social Gatherings with Friends and Family: Not on file  . Attends Religious Services: Not on file  . Active Member of Clubs or Organizations: Not on file  . Attends Archivist Meetings: Not on file  . Marital Status: Not on file  Intimate Partner Violence:   . Fear of Current or  Ex-Partner: Not on file  . Emotionally Abused: Not on file  . Physically Abused: Not on file  . Sexually Abused: Not on file    Outpatient Medications Prior to Visit  Medication Sig Dispense Refill  . acetaminophen (TYLENOL) 325 MG tablet Two tablets every 4 hours as needed for pain.  You can buy over the counter at any drug store.    . cetirizine (ZYRTEC) 10 MG tablet TK 1 T PO Q 24 H    . cyclobenzaprine (FLEXERIL) 10 MG tablet TAKE 1/2 TO 1 TABLET BY MOUTH THREE TIMES DAILY AS NEEDED FOR MUSCLE SPASMS 45 tablet 1  . diclofenac sodium (VOLTAREN) 1 % GEL Apply 4 g topically 4 (four) times daily. To affected joint. 100 g 11  . DULoxetine (CYMBALTA) 60 MG capsule Take 1 capsule (60 mg total) by mouth daily. 30 capsule 2  . fluticasone (FLONASE) 50 MCG/ACT nasal spray One spray in each nostril twice a day, use left hand for right nostril, and right hand for left nostril. 48 g 3  . levonorgestrel-ethinyl estradiol (INTROVALE) 0.15-0.03 MG tablet Take 1 tablet by mouth daily. 91 tablet 3  . LINZESS 290 MCG CAPS capsule TAKE ONE CAPSULE BY MOUTH DAILY, 30 MINUTES BEFORE FIRST MEAL OF THE DAY 90 capsule 1  . metFORMIN (GLUCOPHAGE) 500 MG tablet TAKE 1 TABLET BY MOUTH TWICE DAILY WITH A MEAL 180 tablet 1  . nabumetone (RELAFEN) 500 MG tablet TAKE 1 TABLET BY MOUTH AT BEDTIME AS NEEDED 90 tablet 1  . propranolol ER (INDERAL LA) 60 MG 24 hr capsule TAKE ONE CAPSULE BY MOUTH EVERY DAY 90 capsule 1  . SUMAtriptan (IMITREX) 100 MG tablet TAKE 1 TABLET BY MOUTH EVERY 2 HOURS IF NEEDED FOR MIGRAINES AS DIRECTED 10 tablet 11  . topiramate (TOPAMAX) 50 MG tablet TAKE 1 TABLET(50 MG) BY MOUTH AT BEDTIME 90 tablet 3   No facility-administered medications prior to visit.    Allergies  Allergen Reactions  . Fluoxetine Other (See Comments)    Made her tremor worse   . Lexapro [Escitalopram Oxalate] Other (See Comments)    Headache  . Morphine Nausea And Vomiting  . Sertraline Other (See Comments)     Headache     Review of Systems     Objective:    Physical Exam Constitutional:      Appearance: She is well-developed.  HENT:     Head: Normocephalic and atraumatic.     Comments: Mild erythema over her facial cheeks and nasal bridge. No palpable lesions.  Right Ear: External ear normal.     Left Ear: External ear normal.     Nose: Nose normal.  Eyes:     Conjunctiva/sclera: Conjunctivae normal.     Pupils: Pupils are equal, round, and reactive to light.  Neck:     Thyroid: No thyromegaly.  Cardiovascular:     Rate and Rhythm: Normal rate and regular rhythm.     Heart sounds: Normal heart sounds.  Pulmonary:     Effort: Pulmonary effort is normal.     Breath sounds: Normal breath sounds. No wheezing.  Abdominal:     General: Abdomen is flat. Bowel sounds are normal. There is no distension.     Palpations: Abdomen is soft. There is no mass.     Tenderness: There is no guarding.     Comments: Mild tenderness in the LLQ   Musculoskeletal:     Cervical back: Neck supple.  Lymphadenopathy:     Cervical: No cervical adenopathy.  Skin:    General: Skin is warm and dry.  Neurological:     Mental Status: She is alert and oriented to person, place, and time.     BP (!) 124/59   Pulse 76   Ht _0  (1.676 m)   Wt 156 lb (70.8 kg)   SpO2 100%   BMI 25.18 kg/m  Wt Readings from Last 3 Encounters:  06/04/19 156 lb (70.8 kg)  05/30/19 156 lb (70.8 kg)  03/27/19 138 lb (62.6 kg)    There are no preventive care reminders to display for this patient.  There are no preventive care reminders to display for this patient.   Lab Results  Component Value Date   TSH 1.60 06/07/2015   Lab Results  Component Value Date   WBC 8.6 06/04/2019   HGB 12.8 06/04/2019   HCT 39.1 06/04/2019   MCV 92.2 06/04/2019   PLT 416 (H) 06/04/2019   Lab Results  Component Value Date   NA 138 04/29/2018   K 4.0 04/29/2018   CO2 22 04/29/2018   GLUCOSE 107 (H) 04/29/2018   BUN 15  04/29/2018   CREATININE 1.00 04/29/2018   BILITOT 0.3 02/12/2018   ALKPHOS 51 09/24/2015   AST 19 02/12/2018   ALT 27 02/12/2018   PROT 6.0 (L) 02/12/2018   ALBUMIN 3.9 09/24/2015   CALCIUM 8.6 04/29/2018   Lab Results  Component Value Date   CHOL 265 (H) 08/24/2017   Lab Results  Component Value Date   HDL 47 (L) 08/24/2017   Lab Results  Component Value Date   LDLCALC 183 (H) 08/24/2017   Lab Results  Component Value Date   TRIG 193 (H) 08/24/2017   Lab Results  Component Value Date   CHOLHDL 5.6 (H) 08/24/2017   Lab Results  Component Value Date   HGBA1C 5.3 01/01/2014       Assessment & Plan:   Problem List Items Addressed This Visit      Cardiovascular and Mediastinum   Migraine headache - Primary    Persistent migraine headache.  Initially thought it was secondary to the Covid vaccine but has continued to persist it is mostly in the back of her head and she is not really experiencing any sinus symptoms to indicate sinusitis or other problems.  She does have a myriad of other unusual symptoms such as dry mouth, ulcers, change in color of the toes of her feet that are coming and going etc.  We will give her a prednisone  taper at this point.  She has done well with this in the past.  We had initially held off because she had had her vaccination recently and I did not want to suppress any potential immunity for the Covid vaccine.      Relevant Medications   predniSONE (DELTASONE) 10 MG tablet   Other Relevant Orders   COMPLETE METABOLIC PANEL WITH GFR   Lipid panel   ANA   Sedimentation rate (Completed)   C-reactive protein   Magnesium   CK (Creatine Kinase)   CBC with Differential/Platelet (Completed)    Other Visit Diagnoses    Mouth ulcer       Relevant Orders   COMPLETE METABOLIC PANEL WITH GFR   Lipid panel   ANA   Sedimentation rate (Completed)   C-reactive protein   Magnesium   CK (Creatine Kinase)   CBC with Differential/Platelet  (Completed)   Dry lips       Relevant Orders   COMPLETE METABOLIC PANEL WITH GFR   Lipid panel   ANA   Sedimentation rate (Completed)   C-reactive protein   Magnesium   CK (Creatine Kinase)   CBC with Differential/Platelet (Completed)   Facial rash       Relevant Orders   COMPLETE METABOLIC PANEL WITH GFR   Lipid panel   ANA   Sedimentation rate (Completed)   C-reactive protein   Magnesium   CK (Creatine Kinase)   CBC with Differential/Platelet (Completed)   Blue toes       Relevant Orders   COMPLETE METABOLIC PANEL WITH GFR   Lipid panel   ANA   Sedimentation rate (Completed)   C-reactive protein   Magnesium   CK (Creatine Kinase)   CBC with Differential/Platelet (Completed)   Diarrhea, unspecified type         Mouth ulcers-mostly on the mucosa.  The oral prednisone which is mainly for the migraine should actually help with the mouth ulcers.  If not improving then please let us know.  Dry mouth/lips-unclear etiology just encourage hydration.  Diarrhea-it does not sound there are any specific triggers and she normally has constipation so it is a little bit unusual.  We will check a CBC with differential as well as liver enzymes.  Patient rash-evaluate for possible lupus and inflammatory markers.  Blue toes-start certainly could be Raynaud's that at this point we will work this up a little bit further before full diagnosis.  Meds ordered this encounter  Medications  . predniSONE (DELTASONE) 10 MG tablet    Sig: 8 tabs PO Day 1, 6 tabs Day 2, 4 tabs Day 3, 2 tabs Day 4, 1 tab Day 5    Dispense:  21 tablet    Refill:  0     Beatrice Lecher, MD

## 2019-06-04 NOTE — Assessment & Plan Note (Signed)
Persistent migraine headache.  Initially thought it was secondary to the Covid vaccine but has continued to persist it is mostly in the back of her head and she is not really experiencing any sinus symptoms to indicate sinusitis or other problems.  She does have a myriad of other unusual symptoms such as dry mouth, ulcers, change in color of the toes of her feet that are coming and going etc.  We will give her a prednisone taper at this point.  She has done well with this in the past.  We had initially held off because she had had her vaccination recently and I did not want to suppress any potential immunity for the Covid vaccine.

## 2019-06-05 LAB — LIPID PANEL
Cholesterol: 230 mg/dL — ABNORMAL HIGH (ref <200)
HDL: 53 mg/dL (ref >=50)
LDL Cholesterol (Calc): 141 mg/dL (calc) — ABNORMAL HIGH
Non-HDL Cholesterol (Calc): 177 mg/dL (calc) — ABNORMAL HIGH (ref <130)
Total CHOL/HDL Ratio: 4.3 (calc) (ref <5.0)
Triglycerides: 222 mg/dL — ABNORMAL HIGH (ref <150)

## 2019-06-05 LAB — COMPLETE METABOLIC PANEL WITH GFR
AG Ratio: 1.4 (calc) (ref 1.0–2.5)
ALT: 28 U/L (ref 6–29)
AST: 18 U/L (ref 10–35)
Albumin: 3.7 g/dL (ref 3.6–5.1)
Alkaline phosphatase (APISO): 57 U/L (ref 37–153)
BUN/Creatinine Ratio: 18 (calc) (ref 6–22)
BUN: 20 mg/dL (ref 7–25)
CO2: 22 mmol/L (ref 20–32)
Calcium: 9.5 mg/dL (ref 8.6–10.4)
Chloride: 108 mmol/L (ref 98–110)
Creat: 1.1 mg/dL — ABNORMAL HIGH (ref 0.50–1.05)
GFR, Est African American: 66 mL/min/{1.73_m2} (ref >=60)
GFR, Est Non African American: 57 mL/min/{1.73_m2} — ABNORMAL LOW (ref >=60)
Globulin: 2.6 g/dL (calc) (ref 1.9–3.7)
Glucose, Bld: 74 mg/dL (ref 65–99)
Potassium: 4.4 mmol/L (ref 3.5–5.3)
Sodium: 138 mmol/L (ref 135–146)
Total Bilirubin: 0.2 mg/dL (ref 0.2–1.2)
Total Protein: 6.3 g/dL (ref 6.1–8.1)

## 2019-06-05 LAB — CBC WITH DIFFERENTIAL/PLATELET
Absolute Monocytes: 636 cells/uL (ref 200–950)
Basophils Absolute: 26 cells/uL (ref 0–200)
Basophils Relative: 0.3 %
Eosinophils Absolute: 146 cells/uL (ref 15–500)
Eosinophils Relative: 1.7 %
HCT: 39.1 % (ref 35.0–45.0)
Hemoglobin: 12.8 g/dL (ref 11.7–15.5)
Lymphs Abs: 2700 cells/uL (ref 850–3900)
MCH: 30.2 pg (ref 27.0–33.0)
MCHC: 32.7 g/dL (ref 32.0–36.0)
MCV: 92.2 fL (ref 80.0–100.0)
MPV: 9 fL (ref 7.5–12.5)
Monocytes Relative: 7.4 %
Neutro Abs: 5091 cells/uL (ref 1500–7800)
Neutrophils Relative %: 59.2 %
Platelets: 416 10*3/uL — ABNORMAL HIGH (ref 140–400)
RBC: 4.24 10*6/uL (ref 3.80–5.10)
RDW: 13 % (ref 11.0–15.0)
Total Lymphocyte: 31.4 %
WBC: 8.6 10*3/uL (ref 3.8–10.8)

## 2019-06-05 LAB — ANA: Anti Nuclear Antibody (ANA): NEGATIVE

## 2019-06-05 LAB — C-REACTIVE PROTEIN: CRP: 13.7 mg/L — ABNORMAL HIGH (ref <8.0)

## 2019-06-05 LAB — CK: Total CK: 55 U/L (ref 29–143)

## 2019-06-05 LAB — MAGNESIUM: Magnesium: 2.1 mg/dL (ref 1.5–2.5)

## 2019-06-05 LAB — SEDIMENTATION RATE: Sed Rate: 6 mm/h (ref 0–30)

## 2019-06-13 ENCOUNTER — Encounter: Payer: Self-pay | Admitting: Physician Assistant

## 2019-06-13 ENCOUNTER — Ambulatory Visit (INDEPENDENT_AMBULATORY_CARE_PROVIDER_SITE_OTHER): Payer: BC Managed Care – PPO

## 2019-06-13 ENCOUNTER — Other Ambulatory Visit: Payer: Self-pay

## 2019-06-13 ENCOUNTER — Ambulatory Visit: Payer: BC Managed Care – PPO

## 2019-06-13 ENCOUNTER — Ambulatory Visit: Payer: BC Managed Care – PPO | Admitting: Physician Assistant

## 2019-06-13 VITALS — BP 121/62 | HR 87 | Ht 66.0 in | Wt 159.0 lb

## 2019-06-13 DIAGNOSIS — M542 Cervicalgia: Secondary | ICD-10-CM

## 2019-06-13 DIAGNOSIS — M79652 Pain in left thigh: Secondary | ICD-10-CM

## 2019-06-13 DIAGNOSIS — G43009 Migraine without aura, not intractable, without status migrainosus: Secondary | ICD-10-CM

## 2019-06-13 DIAGNOSIS — R5383 Other fatigue: Secondary | ICD-10-CM | POA: Diagnosis not present

## 2019-06-13 DIAGNOSIS — R531 Weakness: Secondary | ICD-10-CM | POA: Diagnosis not present

## 2019-06-13 DIAGNOSIS — R000 Tachycardia, unspecified: Secondary | ICD-10-CM | POA: Insufficient documentation

## 2019-06-13 MED ORDER — TRAMADOL HCL 50 MG PO TABS: 50.0000 mg | ORAL_TABLET | Freq: Four times a day (QID) | ORAL | 0 refills | Status: AC | PRN

## 2019-06-13 NOTE — Progress Notes (Signed)
Subjective:    Patient ID: Carol Diaz, female    DOB: 04-07-1965, 54 y.o.   MRN: [id number]  HPI  Carol Diaz is a 54 year old female who presents the clinic with her daughter with chief complaint of weakness and left leg swelling.  Patient and daughter states she has not been same since her Covid vaccine February 27.  She has seen Dr. Charise Diaz twice for migraine headache and upper mid back pain as well as posterior school.  Per Dr. Charise Diaz unclear etiology but thought it could be some immune response to the vaccine.  Initially she did not treat however on 30/10/21 she was given a prednisone taper and ordered some blood work.  No significant findings on blood work.  Patient finished prednisone on Sunday.  She continues to have the burning in the back of her head she is now more weak and shaky.  She does have a diagnosis of fibromyalgia and chronic fatigue.  She is most concerned with her left leg swelling and weakness.  Her daughter she said her leg turned blue last night but then is fine this morning. No back pain. No numbness and tingling down left leg. Just weakness.   Her tremors have been worse but that happens when she is stressed. Her migraine continues.    .. Active Ambulatory Problems    Diagnosis Date Noted  . HYPOGLYCEMIA, UNSPECIFIED 11/20/2008  . Iron deficiency anemia 09/22/2009  . DEPRESSION, MAJOR, RECURRENT 01/02/2006  . Migraine headache 01/02/2006  . IRRITABLE BOWEL SYNDROME 01/02/2006  . MENORRHAGIA 09/05/2007  . INSOMNIA NOS 01/02/2006  . FATIGUE 08/31/2009  . PALPITATIONS 09/22/2009  . GROSS HEMATURIA 04/06/2010  . Hyperlipidemia 12/15/2011  . Essential tremor 02/02/2012  . Obesity 09/25/2013  . Neck pain 06/10/2015  . CKD (chronic kidney disease) stage 2, GFR 60-89 ml/min 09/27/2015  . Fibromyalgia 07/17/2016  . Abnormal ankle brachial index (ABI) 10/07/2018  . Abnormal weight 12/20/2018  . Tachycardia 06/13/2019   Resolved Ambulatory Problems    Diagnosis  Date Noted  . Essential hypertension, benign 12/11/2008  . JOINT STIFFNESS, HAND 03/10/2009  . SYNCOPE 11/13/2008  . TREMOR 03/13/2007  . CHEST PAIN, LEFT 08/31/2009  . VIRAL INFECTION 04/06/2010  . Contusion of right foot 01/01/2014  . Acute frontal sinusitis 02/10/2014  . Acute abdominal pain in right lower quadrant 05/15/2017  . Abdominal pain 05/15/2017   Past Medical History:  Diagnosis Date  . Chronic fatigue syndrome   . Hypertension   . Migraines     Review of Systems See HPI.     Objective:   Physical Exam Vitals reviewed.  Constitutional:      Appearance: She is well-developed.  Cardiovascular:     Rate and Rhythm: Normal rate and regular rhythm.     Heart sounds: Normal heart sounds.  Abdominal:     General: Bowel sounds are normal.     Palpations: Abdomen is soft.  Musculoskeletal:       Legs:  Neurological:     Mental Status: She is alert.     Motor: Weakness present.     Comments: Essential tremor of bilateral hands.   Psychiatric:        Mood and Affect: Mood is anxious.           Assessment & Plan:  Marland KitchenMarland KitchenJasiyah was seen today for extremity weakness, fatigue and headache.  Diagnoses and all orders for this visit:  Left thigh pain -     US Venous Img Lower Unilateral Left -  B. burgdorfi antibodies  Neck pain -     DG Cervical Spine Complete -     traMADol (ULTRAM) 50 MG tablet; Take 1 tablet (50 mg total) by mouth every 6 (six) hours as needed for up to 5 days.  Weakness -     CMV abs, IgG+IgM (cytomegalovirus) -     Epstein-Barr virus VCA antibody panel -     C-reactive protein -     Sed Rate (ESR) -     TSH -     B. burgdorfi antibodies -     SPECIMEN COMPROMISED  Fatigue, unspecified type -     CMV abs, IgG+IgM (cytomegalovirus) -     Epstein-Barr virus VCA antibody panel -     C-reactive protein -     Sed Rate (ESR) -     TSH -     B. burgdorfi antibodies -     SPECIMEN COMPROMISED  Migraine without aura and without  status migrainosus, not intractable   Unclear etiology of symptoms.  Certainly the vaccine could have initiated a fibromyalgia flare.  On cymbalta, flexeril, relafen. We will give some tramadol to use as needed for pain.  Encouraged hydration and good nutrition.   ..PDMP reviewed during this encounter.    Since leg pain is unilateral and she is having weakness we will get an ultrasound of the left leg. Stat ultrasound was negative for any blood clot. Reassurance given.   I will repeat labs today adding some EBV, CMV, Lyme's to look for any reason for fatigue.  Due to neck pain will get x-ray of cervical spine.  Certainly if there is something musculoskeletal going on we could consider appropriate injections etc.  Request referral to neurology for headaches.

## 2019-06-13 NOTE — Progress Notes (Signed)
Called patient and gave negative DVT results. Will make referral to neurology.

## 2019-06-15 ENCOUNTER — Other Ambulatory Visit: Payer: Self-pay | Admitting: Family Medicine

## 2019-06-16 NOTE — Progress Notes (Signed)
Zoye,   No significant arthritis or changes on xray. Disc spaces appear unremarkable.

## 2019-06-16 NOTE — Progress Notes (Signed)
Carol Diaz,   Elevated CRP but better than 12 days ago.  Sed rate normal.  Thyroid normal.   Some labs still pending.

## 2019-06-17 ENCOUNTER — Encounter: Payer: Self-pay | Admitting: Physician Assistant

## 2019-06-17 LAB — SEDIMENTATION RATE: Sed Rate: 9 mm/h (ref 0–30)

## 2019-06-17 LAB — SPECIMEN COMPROMISED

## 2019-06-17 LAB — C-REACTIVE PROTEIN: CRP: 12.8 mg/L — ABNORMAL HIGH (ref <8.0)

## 2019-06-17 LAB — CMV ABS, IGG+IGM (CYTOMEGALOVIRUS)
CMV IgM: <30 AU/mL
Cytomegalovirus Ab-IgG: 3.2 U/mL — ABNORMAL HIGH

## 2019-06-17 LAB — EPSTEIN-BARR VIRUS VCA ANTIBODY PANEL
EBV NA IgG: 125 U/mL — ABNORMAL HIGH
EBV VCA IgG: >750 U/mL — ABNORMAL HIGH
EBV VCA IgM: <36 U/mL

## 2019-06-17 LAB — TSH: TSH: 1.14 mIU/L

## 2019-06-17 NOTE — Progress Notes (Signed)
Hx of CMV but no recent infection.

## 2019-06-17 NOTE — Progress Notes (Signed)
Carol Diaz,   No recent mono infection.

## 2019-06-18 ENCOUNTER — Other Ambulatory Visit: Payer: Self-pay | Admitting: *Deleted

## 2019-06-18 DIAGNOSIS — R928 Other abnormal and inconclusive findings on diagnostic imaging of breast: Secondary | ICD-10-CM

## 2019-06-20 ENCOUNTER — Encounter: Payer: Self-pay | Admitting: Physician Assistant

## 2019-06-20 NOTE — Telephone Encounter (Signed)
The referral was put it on Tuesday I sent it to [facility] [place] but the fax kept failing I have refaxed today and received confirmation she should get a call next week to schedule - CF

## 2019-06-23 ENCOUNTER — Encounter: Payer: Self-pay | Admitting: Family Medicine

## 2019-06-24 ENCOUNTER — Ambulatory Visit (INDEPENDENT_AMBULATORY_CARE_PROVIDER_SITE_OTHER): Payer: BC Managed Care – PPO

## 2019-06-24 ENCOUNTER — Ambulatory Visit (INDEPENDENT_AMBULATORY_CARE_PROVIDER_SITE_OTHER): Payer: BC Managed Care – PPO | Admitting: Family Medicine

## 2019-06-24 ENCOUNTER — Other Ambulatory Visit: Payer: Self-pay

## 2019-06-24 ENCOUNTER — Encounter: Payer: Self-pay | Admitting: Family Medicine

## 2019-06-24 VITALS — BP 124/76 | HR 78 | Temp 98.3°F | Ht 66.14 in | Wt 162.0 lb

## 2019-06-24 DIAGNOSIS — R0602 Shortness of breath: Secondary | ICD-10-CM

## 2019-06-24 DIAGNOSIS — R8290 Unspecified abnormal findings in urine: Secondary | ICD-10-CM

## 2019-06-24 DIAGNOSIS — R5383 Other fatigue: Secondary | ICD-10-CM

## 2019-06-24 DIAGNOSIS — R0601 Orthopnea: Secondary | ICD-10-CM

## 2019-06-24 DIAGNOSIS — G250 Essential tremor: Secondary | ICD-10-CM | POA: Diagnosis not present

## 2019-06-24 DIAGNOSIS — N3001 Acute cystitis with hematuria: Secondary | ICD-10-CM

## 2019-06-24 LAB — POCT URINALYSIS DIP (CLINITEK)
Bilirubin, UA: NEGATIVE
Glucose, UA: NEGATIVE mg/dL
Ketones, POC UA: NEGATIVE mg/dL
Leukocytes, UA: NEGATIVE
Nitrite, UA: NEGATIVE
POC PROTEIN,UA: NEGATIVE
Spec Grav, UA: >=1.03 — AB (ref 1.010–1.025)
Urobilinogen, UA: 0.2 E.U./dL
pH, UA: 6 (ref 5.0–8.0)

## 2019-06-24 MED ORDER — CEPHALEXIN 500 MG PO CAPS: 500.0000 mg | ORAL_CAPSULE | Freq: Two times a day (BID) | ORAL | 0 refills | Status: DC

## 2019-06-24 NOTE — Assessment & Plan Note (Addendum)
Does have an appointment coming up with neurology.  We discussed her treatments for essential tremor, they just are not cures.  But there are medications that can be quite effective.  She does have an occasional head tremor as well.  He is wanting to consult with neurology.

## 2019-06-24 NOTE — Progress Notes (Signed)
Established Patient Office Visit  Subjective:  Patient ID: Carol Diaz, female    DOB: 04/02/1965  Age: 54 y.o. MRN: [id number]  CC: No chief complaint on file.   HPI Carol Diaz presents for cloudy urine and pain between shoulder blades.  She reports that starting yesterday she noticed that she was experiencing some cloudy urine.  No blood in the urine.  She is also had some urgency and frequency since then but no dysuria.  She also reports that she has felt very swollen and bloated particularly in her abdomen and in her hands and feet.  She says that has actually been going on for a couple of weeks.  She feels like her bowels are moving normally and she is having a bowel movement daily she does have a history of chronic constipation.  She also reports that she has been waking up with a lot of congestion and mucus in her throat and chest and will cough a little bit first thing in the morning she says that been going on for about 2 months over the last couple of weeks she is actually not been able to lie down and sleep flat.  She is had to get up in the recliner because she feels like she cannot breathe if she lies flat.  She feels like all of her symptoms started around the time that she received her Covid vaccine.  She denies any fevers chills or sweats.  Tremor-she does feel like her tremor has gotten worse over time.  He is even noticed a little head bubble.  Past Medical History:  Diagnosis Date  . Chronic fatigue syndrome   . Hypertension   . Migraines   . Palpitations     Past Surgical History:  Procedure Laterality Date  . CESAREAN SECTION    . ivp    . LAPAROSCOPIC APPENDECTOMY N/A 05/17/2017   Procedure: DIAGNOSTIC LAPAROSCOPY APPENDECTOMY;  Surgeon: Romie Levee, MD;  Location: [facility];  Service: General;  Laterality: N/A;  . TUBAL LIGATION      Family History  Problem Relation Age of Onset  . Emphysema Mother   . Hypertension Father   . Heart disease  Brother 19       mi    Social History   Socioeconomic History  . Marital status: Married    Spouse name: Not on file  . Number of children: 2  . Years of education: Not on file  . Highest education level: Not on file  Occupational History    Employer: [employer]  Tobacco Use  . Smoking status: Never Smoker  . Smokeless tobacco: Never Used  Substance and Sexual Activity  . Alcohol use: No  . Drug use: No  . Sexual activity: Yes    Partners: Male    Birth control/protection: Pill  Other Topics Concern  . Not on file  Social History Narrative   Some exercise.    Social Determinants of Health   Financial Resource Strain:   . Difficulty of Paying Living Expenses:   Food Insecurity:   . Worried About Programme researcher, broadcasting/film/video in the Last Year:   . Barista in the Last Year:   Transportation Needs:   . Freight forwarder (Medical):   Marland Kitchen Lack of Transportation (Non-Medical):   Physical Activity:   . Days of Exercise per Week:   . Minutes of Exercise per Session:   Stress:   . Feeling of Stress :  Social Connections:   . Frequency of Communication with Friends and Family:   . Frequency of Social Gatherings with Friends and Family:   . Attends Religious Services:   . Active Member of Clubs or Organizations:   . Attends Archivist Meetings:   Marland Kitchen Marital Status:   Intimate Partner Violence:   . Fear of Current or Ex-Partner:   . Emotionally Abused:   Marland Kitchen Physically Abused:   . Sexually Abused:     Outpatient Medications Prior to Visit  Medication Sig Dispense Refill  . acetaminophen (TYLENOL) 325 MG tablet Two tablets every 4 hours as needed for pain.  You can buy over the counter at any drug store.    . cetirizine (ZYRTEC) 10 MG tablet TK 1 T PO Q 24 H    . cyclobenzaprine (FLEXERIL) 10 MG tablet TAKE 1/2 TO 1 TABLET BY MOUTH THREE TIMES DAILY AS NEEDED FOR MUSCLE SPASMS 45 tablet 1  . diclofenac sodium (VOLTAREN) 1 % GEL Apply 4 g topically  4 (four) times daily. To affected joint. 100 g 11  . DULoxetine (CYMBALTA) 60 MG capsule Take 1 capsule (60 mg total) by mouth daily. 30 capsule 2  . fluticasone (FLONASE) 50 MCG/ACT nasal spray One spray in each nostril twice a day, use left hand for right nostril, and right hand for left nostril. 48 g 3  . levonorgestrel-ethinyl estradiol (INTROVALE) 0.15-0.03 MG tablet Take 1 tablet by mouth daily. 91 tablet 3  . LINZESS 290 MCG CAPS capsule TAKE ONE CAPSULE BY MOUTH DAILY, 30 MINUTES BEFORE FIRST MEAL OF THE DAY 90 capsule 1  . metFORMIN (GLUCOPHAGE) 500 MG tablet TAKE 1 TABLET BY MOUTH TWICE DAILY WITH A MEAL 180 tablet 1  . nabumetone (RELAFEN) 500 MG tablet TAKE 1 TABLET BY MOUTH AT BEDTIME AS NEEDED 90 tablet 1  . propranolol ER (INDERAL LA) 60 MG 24 hr capsule TAKE ONE CAPSULE BY MOUTH EVERY DAY 90 capsule 1  . SUMAtriptan (IMITREX) 100 MG tablet TAKE 1 TABLET BY MOUTH EVERY 2 HOURS IF NEEDED FOR MIGRAINES AS DIRECTED 10 tablet 11  . topiramate (TOPAMAX) 50 MG tablet TAKE 1 TABLET(50 MG) BY MOUTH AT BEDTIME 90 tablet 3   No facility-administered medications prior to visit.    Allergies  Allergen Reactions  . Fluoxetine Other (See Comments)    Made her tremor worse   . Lexapro [Escitalopram Oxalate] Other (See Comments)    Headache  . Morphine Nausea And Vomiting  . Sertraline Other (See Comments)    Headache     ROS Review of Systems    Objective:    Physical Exam  Constitutional: She is oriented to person, place, and time. She appears well-developed and well-nourished.  HENT:  Head: Normocephalic and atraumatic.  Cardiovascular: Normal rate, regular rhythm and normal heart sounds.  Pulmonary/Chest: Effort normal and breath sounds normal.  Abdominal: Soft. Bowel sounds are normal. She exhibits no distension and no mass. There is no abdominal tenderness. There is no rebound and no guarding.  Musculoskeletal:        General: No edema.  Neurological: She is alert and  oriented to person, place, and time. No cranial nerve deficit.  Bilateral hand tremor.  Better at rest.  Increased with intention.  Skin: Skin is warm and dry.  Psychiatric: She has a normal mood and affect. Her behavior is normal.    BP 124/76 (BP Location: Left Arm, Patient Position: Sitting, Cuff Size: Normal)   Pulse 78  Temp 98.3 F (36.8 C) (Oral)   Ht 5' 6.14" (1.68 m)   Wt 162 lb (73.5 kg)   LMP 04/22/2018 (Exact Date)   SpO2 100%   BMI 26.04 kg/m  Wt Readings from Last 3 Encounters:  06/24/19 162 lb (73.5 kg)  06/13/19 159 lb (72.1 kg)  06/04/19 156 lb (70.8 kg)     There are no preventive care reminders to display for this patient.  There are no preventive care reminders to display for this patient.  Lab Results  Component Value Date   TSH 1.14 06/13/2019   Lab Results  Component Value Date   WBC 8.6 06/04/2019   HGB 12.8 06/04/2019   HCT 39.1 06/04/2019   MCV 92.2 06/04/2019   PLT 416 (H) 06/04/2019   Lab Results  Component Value Date   NA 138 06/04/2019   K 4.4 06/04/2019   CO2 22 06/04/2019   GLUCOSE 74 06/04/2019   BUN 20 06/04/2019   CREATININE 1.10 (H) 06/04/2019   BILITOT 0.2 06/04/2019   ALKPHOS 51 09/24/2015   AST 18 06/04/2019   ALT 28 06/04/2019   PROT 6.3 06/04/2019   ALBUMIN 3.9 09/24/2015   CALCIUM 9.5 06/04/2019   Lab Results  Component Value Date   CHOL 230 (H) 06/04/2019   Lab Results  Component Value Date   HDL 53 06/04/2019   Lab Results  Component Value Date   LDLCALC 141 (H) 06/04/2019   Lab Results  Component Value Date   TRIG 222 (H) 06/04/2019   Lab Results  Component Value Date   CHOLHDL 4.3 06/04/2019   Lab Results  Component Value Date   HGBA1C 5.3 01/01/2014      Assessment & Plan:   Problem List Items Addressed This Visit      Nervous and Auditory   Essential tremor    Does have an appointment coming up with neurology.  We discussed her treatments for essential tremor, they just are not  cures.  But there are medications that can be quite effective.  She does have an occasional head tremor as well.  He is wanting to consult with neurology.       Other Visit Diagnoses    Cloudy urine    -  Primary   Relevant Orders   POCT URINALYSIS DIP (CLINITEK) (Completed)   Urine Culture   SOB (shortness of breath)       Relevant Orders   DG Chest 2 View   D-dimer, quantitative (not at [facility])   ECHOCARDIOGRAM COMPLETE   Orthopnea       Relevant Orders   DG Chest 2 View   D-dimer, quantitative (not at [facility])   ECHOCARDIOGRAM COMPLETE   Acute cystitis with hematuria       Fatigue, unspecified type          SOB with orthopnea - will get CXR and check d-dimer. Will schedule for Echo to evaluate for carditis and or cardiomyopathy.  UTI -urinalysis positive for blood and she is having urgency and frequency.  We will go ahead and treat with Keflex.  Urine culture urine culture sent.  Will call with results once available.  Fatigue-she is been having extreme fatigue more so than she even typically experiences with her fibromyalgia.  And the persistence of the symptoms is also much longer than she typically experiences with her fibromyalgia.  We actually just did blood work about 2-1/2 weeks ago everything came back reassuring.  Meds ordered this encounter  Medications  .  cephALEXin (KEFLEX) 500 MG capsule    Sig: Take 1 capsule (500 mg total) by mouth 2 (two) times daily.    Dispense:  10 capsule    Refill:  0    Follow-up: No follow-ups on file.    Nani Gasser, MD

## 2019-06-25 LAB — URINE CULTURE
MICRO NUMBER:: 10307422
SPECIMEN QUALITY:: ADEQUATE

## 2019-06-25 LAB — D-DIMER, QUANTITATIVE: D-Dimer, Quant: 0.48 mcg/mL FEU (ref <0.50)

## 2019-07-01 ENCOUNTER — Other Ambulatory Visit: Payer: Self-pay

## 2019-07-01 ENCOUNTER — Ambulatory Visit (HOSPITAL_BASED_OUTPATIENT_CLINIC_OR_DEPARTMENT_OTHER)
Admission: RE | Admit: 2019-07-01 | Discharge: 2019-07-01 | Disposition: A | Discharge status: Home / Self Care | Payer: BC Managed Care – PPO | Source: Ambulatory Visit | Attending: Family Medicine | Admitting: Family Medicine

## 2019-07-01 ENCOUNTER — Other Ambulatory Visit: Payer: Self-pay | Admitting: *Deleted

## 2019-07-01 ENCOUNTER — Telehealth: Payer: Self-pay

## 2019-07-01 DIAGNOSIS — R0601 Orthopnea: Secondary | ICD-10-CM | POA: Insufficient documentation

## 2019-07-01 DIAGNOSIS — R0602 Shortness of breath: Secondary | ICD-10-CM | POA: Diagnosis present

## 2019-07-01 DIAGNOSIS — M542 Cervicalgia: Secondary | ICD-10-CM

## 2019-07-01 MED ORDER — DULOXETINE HCL 60 MG PO CPEP: 60.0000 mg | ORAL_CAPSULE | Freq: Every day | ORAL | 2 refills | Status: DC

## 2019-07-01 NOTE — Telephone Encounter (Signed)
[facility] called requesting most recent lab results for pt to attach to referral. Pls send copy of labs to [phone]. Thanks.

## 2019-07-01 NOTE — Progress Notes (Signed)
  Echocardiogram 2D Echocardiogram has been performed.  Carol Diaz 07/01/2019, 2:36 PM

## 2019-07-01 NOTE — Telephone Encounter (Signed)
Faxed Labs to the number [place] directed

## 2019-07-29 ENCOUNTER — Encounter: Payer: Self-pay | Admitting: Family Medicine

## 2019-07-29 DIAGNOSIS — R21 Rash and other nonspecific skin eruption: Secondary | ICD-10-CM

## 2019-07-29 DIAGNOSIS — R3129 Other microscopic hematuria: Secondary | ICD-10-CM

## 2019-07-29 DIAGNOSIS — M7989 Other specified soft tissue disorders: Secondary | ICD-10-CM

## 2019-07-29 MED ORDER — FUROSEMIDE 20 MG PO TABS: 20.0000 mg | ORAL_TABLET | Freq: Every day | ORAL | 0 refills | Status: DC | PRN

## 2019-07-29 NOTE — Telephone Encounter (Signed)
Already had doppler and referral to neurology in March. Please advise

## 2019-07-29 NOTE — Telephone Encounter (Signed)
Check a CBC as well as a metabolic panel.  She also had some blood in her urine when she was here last time so I definitely want a recheck that.  States that over prescription for Lasix to take daily to see if we can pull some of the fluid off and if that gets her more comfortable and if she seems to be responding to that therapy.  Try to get her scheduled at the end of this week.

## 2019-07-29 NOTE — Telephone Encounter (Signed)
Patient advised. Will get labs done tomorrow morning and will start new medication. Patient scheduled on Friday for re-check. Will send me a note of any new medications neurology started. Will work on getting office notes from neurology.

## 2019-07-30 NOTE — Telephone Encounter (Signed)
I have updated medications on her chart.   FYI to you on new meds

## 2019-07-31 LAB — CBC WITH DIFFERENTIAL/PLATELET
Absolute Monocytes: 524 cells/uL (ref 200–950)
Basophils Absolute: 54 cells/uL (ref 0–200)
Basophils Relative: 0.7 %
Eosinophils Absolute: 239 cells/uL (ref 15–500)
Eosinophils Relative: 3.1 %
HCT: 41.2 % (ref 35.0–45.0)
Hemoglobin: 13.7 g/dL (ref 11.7–15.5)
Lymphs Abs: 2064 cells/uL (ref 850–3900)
MCH: 30.6 pg (ref 27.0–33.0)
MCHC: 33.3 g/dL (ref 32.0–36.0)
MCV: 92.2 fL (ref 80.0–100.0)
MPV: 9.3 fL (ref 7.5–12.5)
Monocytes Relative: 6.8 %
Neutro Abs: 4820 cells/uL (ref 1500–7800)
Neutrophils Relative %: 62.6 %
Platelets: 328 10*3/uL (ref 140–400)
RBC: 4.47 10*6/uL (ref 3.80–5.10)
RDW: 12.5 % (ref 11.0–15.0)
Total Lymphocyte: 26.8 %
WBC: 7.7 10*3/uL (ref 3.8–10.8)

## 2019-07-31 LAB — URINALYSIS, ROUTINE W REFLEX MICROSCOPIC
Bacteria, UA: NONE SEEN /HPF
Bilirubin Urine: NEGATIVE
Glucose, UA: NEGATIVE
Hyaline Cast: NONE SEEN /LPF
Ketones, ur: NEGATIVE
Leukocytes,Ua: NEGATIVE
Nitrite: NEGATIVE
Protein, ur: NEGATIVE
Specific Gravity, Urine: 1.019 (ref 1.001–1.03)
WBC, UA: NONE SEEN /HPF (ref 0–5)
pH: 5.5 (ref 5.0–8.0)

## 2019-07-31 LAB — COMPLETE METABOLIC PANEL WITH GFR
AG Ratio: 1.4 (calc) (ref 1.0–2.5)
ALT: 14 U/L (ref 6–29)
AST: 16 U/L (ref 10–35)
Albumin: 3.6 g/dL (ref 3.6–5.1)
Alkaline phosphatase (APISO): 53 U/L (ref 37–153)
BUN/Creatinine Ratio: 16 (calc) (ref 6–22)
BUN: 18 mg/dL (ref 7–25)
CO2: 23 mmol/L (ref 20–32)
Calcium: 9.5 mg/dL (ref 8.6–10.4)
Chloride: 106 mmol/L (ref 98–110)
Creat: 1.16 mg/dL — ABNORMAL HIGH (ref 0.50–1.05)
GFR, Est African American: 62 mL/min/{1.73_m2} (ref >=60)
GFR, Est Non African American: 54 mL/min/{1.73_m2} — ABNORMAL LOW (ref >=60)
Globulin: 2.6 g/dL (calc) (ref 1.9–3.7)
Glucose, Bld: 78 mg/dL (ref 65–139)
Potassium: 4.3 mmol/L (ref 3.5–5.3)
Sodium: 136 mmol/L (ref 135–146)
Total Bilirubin: 0.3 mg/dL (ref 0.2–1.2)
Total Protein: 6.2 g/dL (ref 6.1–8.1)

## 2019-08-01 ENCOUNTER — Encounter: Payer: Self-pay | Admitting: Family Medicine

## 2019-08-01 ENCOUNTER — Ambulatory Visit (INDEPENDENT_AMBULATORY_CARE_PROVIDER_SITE_OTHER): Payer: BC Managed Care – PPO | Admitting: Family Medicine

## 2019-08-01 ENCOUNTER — Other Ambulatory Visit: Payer: Self-pay

## 2019-08-01 VITALS — BP 122/77 | HR 82 | Ht 66.0 in | Wt 167.0 lb

## 2019-08-01 DIAGNOSIS — E538 Deficiency of other specified B group vitamins: Secondary | ICD-10-CM | POA: Diagnosis not present

## 2019-08-01 DIAGNOSIS — E519 Thiamine deficiency, unspecified: Secondary | ICD-10-CM

## 2019-08-01 DIAGNOSIS — R230 Cyanosis: Secondary | ICD-10-CM | POA: Diagnosis not present

## 2019-08-01 DIAGNOSIS — M7989 Other specified soft tissue disorders: Secondary | ICD-10-CM | POA: Insufficient documentation

## 2019-08-01 DIAGNOSIS — E559 Vitamin D deficiency, unspecified: Secondary | ICD-10-CM

## 2019-08-01 NOTE — Progress Notes (Signed)
She reports having bilateral leg swelling. The L leg is worse than the R. The pain lasts all day and she rates it 8/10 describes it as being dull/achy. Its bad after she's been standing and gets up to walk after sitting. She reports that last night she had a new pain in her L groin area that lasted for about 30 minutes.   Her daughter stated that her toes and the bottom of her feet turn purple and this gets better when she elevates her feet.

## 2019-08-01 NOTE — Patient Instructions (Addendum)
Ultrasound at [facility] 08/02/2019 at 11:00 am

## 2019-08-01 NOTE — Progress Notes (Signed)
Established Patient Office Visit  Subjective:  Patient ID: Carol Diaz, female    DOB: 02-03-1966  Age: 54 y.o. MRN: [id number]  CC:  Chief Complaint  Patient presents with  . Edema    HPI [name] presents for recent onset and increase in leg swelling bilat, worse on the left.  Area of worst pain is just above her left knee on that lower upper thigh anteriorly.  No injury or trauma.  She says the swelling in general started around a month ago and then the discoloration of her toes started about 2 weeks ago.  She describes the pain in her lower legs as dull and achey.   Using tramadol and heat and that helps.  Toes change color to purple.  She says it is more uncomfortable if she has been sitting for a little while or lying down and then tries to get up and move.  She says the only thing that seems to help is if she tries to elevate her feet and legs.  He says bending that left knee can be quite painful at times.  She did see the neurologist twice, Dr. Jolyn Lent at [facility].  We unfortunately do not have a copy of the blood work or notes.  But she said that they did do an MRI of her head and neck.  They did find that her B12 was extremely low as well as her B1 and vitamin D so she is on B12 shots weekly that her daughter is helping to administer.  And she is on oral supplement for B1 and vitamin D.  She is following up in about 3 more weeks with the neurologist to recheck those levels.  Interestingly her mother had a diagnosis of pernicious anemia.  Reports that her urine is still cloudy first thing in the morning.  Positive family history of sister with rheumatoid arthritis.  She reports having bilateral leg swelling. The L leg is worse than the R. The pain lasts all day and she rates it 8/10 describes it as being dull/achy. Its bad after she's been standing and gets up to walk after sitting. She reports that last night she had a new pain in her L groin area that lasted for  about 30 minutes.   Her daughter stated that her toes and the bottom of her feet turn purple and this gets better when she elevates her feet.   Past Medical History:  Diagnosis Date  . Chronic fatigue syndrome   . Hypertension   . Migraines   . Palpitations     Past Surgical History:  Procedure Laterality Date  . CESAREAN SECTION    . ivp    . LAPAROSCOPIC APPENDECTOMY N/A 05/17/2017   Procedure: DIAGNOSTIC LAPAROSCOPY APPENDECTOMY;  Surgeon: Romie Levee, MD;  Location: [facility];  Service: General;  Laterality: N/A;  . TUBAL LIGATION      Family History  Problem Relation Age of Onset  . Emphysema Mother   . Hypertension Father   . Heart disease Brother 64       mi    Social History   Socioeconomic History  . Marital status: Married    Spouse name: Not on file  . Number of children: 2  . Years of education: Not on file  . Highest education level: Not on file  Occupational History    Employer: [employer]  Tobacco Use  . Smoking status: Never Smoker  . Smokeless tobacco: Never Used  Substance and Sexual Activity  . Alcohol use: No  . Drug use: No  . Sexual activity: Yes    Partners: Male    Birth control/protection: Pill  Other Topics Concern  . Not on file  Social History Narrative   Some exercise.    Social Determinants of Health   Financial Resource Strain:   . Difficulty of Paying Living Expenses:   Food Insecurity:   . Worried About Programme researcher, broadcasting/film/video in the Last Year:   . Barista in the Last Year:   Transportation Needs:   . Freight forwarder (Medical):   Marland Kitchen Lack of Transportation (Non-Medical):   Physical Activity:   . Days of Exercise per Week:   . Minutes of Exercise per Session:   Stress:   . Feeling of Stress :   Social Connections:   . Frequency of Communication with Friends and Family:   . Frequency of Social Gatherings with Friends and Family:   . Attends Religious Services:   . Active Member of Clubs or  Organizations:   . Attends Banker Meetings:   Marland Kitchen Marital Status:   Intimate Partner Violence:   . Fear of Current or Ex-Partner:   . Emotionally Abused:   Marland Kitchen Physically Abused:   . Sexually Abused:     Outpatient Medications Prior to Visit  Medication Sig Dispense Refill  . acetaminophen (TYLENOL) 325 MG tablet Two tablets every 4 hours as needed for pain.  You can buy over the counter at any drug store.    . cetirizine (ZYRTEC) 10 MG tablet TK 1 T PO Q 24 H    . cyanocobalamin (,VITAMIN B-12,) 1000 MCG/ML injection Inject 1,000 mcg into the muscle once a week.    . cyclobenzaprine (FLEXERIL) 10 MG tablet TAKE 1/2 TO 1 TABLET BY MOUTH THREE TIMES DAILY AS NEEDED FOR MUSCLE SPASMS 45 tablet 1  . diclofenac sodium (VOLTAREN) 1 % GEL Apply 4 g topically 4 (four) times daily. To affected joint. 100 g 11  . DULoxetine (CYMBALTA) 60 MG capsule Take 1 capsule (60 mg total) by mouth daily. 30 capsule 2  . fluticasone (FLONASE) 50 MCG/ACT nasal spray One spray in each nostril twice a day, use left hand for right nostril, and right hand for left nostril. 48 g 3  . furosemide (LASIX) 20 MG tablet Take 1 tablet (20 mg total) by mouth daily as needed for edema. 30 tablet 0  . levonorgestrel-ethinyl estradiol (INTROVALE) 0.15-0.03 MG tablet Take 1 tablet by mouth daily. 91 tablet 3  . LINZESS 290 MCG CAPS capsule TAKE ONE CAPSULE BY MOUTH DAILY, 30 MINUTES BEFORE FIRST MEAL OF THE DAY 90 capsule 1  . metFORMIN (GLUCOPHAGE) 500 MG tablet TAKE 1 TABLET BY MOUTH TWICE DAILY WITH A MEAL 180 tablet 1  . nabumetone (RELAFEN) 500 MG tablet TAKE 1 TABLET BY MOUTH AT BEDTIME AS NEEDED 90 tablet 1  . primidone (MYSOLINE) 50 MG tablet Take 50 mg by mouth 2 (two) times daily.    . propranolol ER (INDERAL LA) 60 MG 24 hr capsule TAKE ONE CAPSULE BY MOUTH EVERY DAY 90 capsule 1  . SUMAtriptan (IMITREX) 100 MG tablet TAKE 1 TABLET BY MOUTH EVERY 2 HOURS IF NEEDED FOR MIGRAINES AS DIRECTED 10 tablet 11  .  Thiamine HCl (B-1) 100 MG TABS Take 1 tablet by mouth 2 (two) times daily.    Marland Kitchen topiramate (TOPAMAX) 50 MG tablet TAKE 1 TABLET(50 MG) BY MOUTH AT  BEDTIME 90 tablet 3  . traMADol (ULTRAM) 50 MG tablet Take 50 mg by mouth 3 (three) times daily as needed.    . Vitamin D, Ergocalciferol, (DRISDOL) 1.25 MG (50000 UNIT) CAPS capsule Take 50,000 Units by mouth once a week.    . cephALEXin (KEFLEX) 500 MG capsule Take 1 capsule (500 mg total) by mouth 2 (two) times daily. 10 capsule 0  . [START ON 08/15/2019] cyanocobalamin (,VITAMIN B-12,) 1000 MCG/ML injection Inject 1,000 mcg into the muscle every 30 (thirty) days.     No facility-administered medications prior to visit.    Allergies  Allergen Reactions  . Fluoxetine Other (See Comments)    Made her tremor worse   . Lexapro [Escitalopram Oxalate] Other (See Comments)    Headache  . Morphine Nausea And Vomiting  . Sertraline Other (See Comments)    Headache     ROS Review of Systems    Objective:    Physical Exam  Constitutional: She is oriented to person, place, and time. She appears well-developed and well-nourished.  HENT:  Head: Normocephalic and atraumatic.  Eyes: Conjunctivae and EOM are normal.  Cardiovascular: Normal rate.  Dorsal pedal pulses are 2+ bilaterally.  Pulmonary/Chest: Effort normal.  Musculoskeletal:     Comments: Trace ankle edema bilaterally.  She does have significant bluish discoloration of her toes bilaterally.  It comes up just a little bit higher on her left foot just to the midfoot.  The bottoms of both feet have a purplish discoloration as well.  Neurological: She is alert and oriented to person, place, and time.  Skin: Skin is dry. No pallor.  Psychiatric: She has a normal mood and affect. Her behavior is normal.  Vitals reviewed.   BP 122/77   Pulse 82   Ht 5\' 6"  (1.676 m)   Wt 167 lb (75.8 kg)   LMP 04/22/2018 (Exact Date)   SpO2 100%   BMI 26.95 kg/m  Wt Readings from Last 3 Encounters:   08/01/19 167 lb (75.8 kg)  06/24/19 162 lb (73.5 kg)  06/13/19 159 lb (72.1 kg)     There are no preventive care reminders to display for this patient.  There are no preventive care reminders to display for this patient.  Lab Results  Component Value Date   TSH 1.14 06/13/2019   Lab Results  Component Value Date   WBC 7.7 07/30/2019   HGB 13.7 07/30/2019   HCT 41.2 07/30/2019   MCV 92.2 07/30/2019   PLT 328 07/30/2019   Lab Results  Component Value Date   NA 136 07/30/2019   K 4.3 07/30/2019   CO2 23 07/30/2019   GLUCOSE 78 07/30/2019   BUN 18 07/30/2019   CREATININE 1.16 (H) 07/30/2019   BILITOT 0.3 07/30/2019   ALKPHOS 51 09/24/2015   AST 16 07/30/2019   ALT 14 07/30/2019   PROT 6.2 07/30/2019   ALBUMIN 3.9 09/24/2015   CALCIUM 9.5 07/30/2019   Lab Results  Component Value Date   CHOL 230 (H) 06/04/2019   Lab Results  Component Value Date   HDL 53 06/04/2019   Lab Results  Component Value Date   LDLCALC 141 (H) 06/04/2019   Lab Results  Component Value Date   TRIG 222 (H) 06/04/2019   Lab Results  Component Value Date   CHOLHDL 4.3 06/04/2019   Lab Results  Component Value Date   HGBA1C 5.3 01/01/2014      Assessment & Plan:   Problem List Items Addressed This  Visit      Other   Localized swelling of both lower extremities - Primary    She has trace ankle edema bilaterally but it does not look like it is pitting.  She has noticed maybe a little bit of improvement that she is been on the diuretic for the last 2 days we will continue that for now.  We need to recheck labs.      Relevant Orders   US Venous Img Lower Bilateral   Blue toes    Definitely has a bluish coloration to her toes today which have never seen before on her feet and it is bilateral mostly over the toes but going up a little bit towards the midfoot on her left foot I like to just rule out a DVT especially since she is having significant leg pain on the left just above  the knee probably schedule her for arterial evaluation.  Though dorsal pedal pulses are 2+.      Relevant Orders   US Venous Img Lower Bilateral    Other Visit Diagnoses    B12 deficiency       Vitamin B1 deficiency       Vitamin D deficiency         B12 deficiency-the last B12 level that we did on her was about 3 years ago but it was normal at that time.  Currently on injections plan to recheck levels with neurology.  He does feel like her energy levels have actually been better since starting the B12.  B1 and vitamin D deficiencies-currently on oral supplementation.  No orders of the defined types were placed in this encounter.   Follow-up: Return if symptoms worsen or fail to improve.    Beatrice Lecher, MD

## 2019-08-01 NOTE — Assessment & Plan Note (Signed)
Definitely has a bluish coloration to her toes today which have never seen before on her feet and it is bilateral mostly over the toes but going up a little bit towards the midfoot on her left foot I like to just rule out a DVT especially since she is having significant leg pain on the left just above the knee probably schedule her for arterial evaluation.  Though dorsal pedal pulses are 2+.

## 2019-08-01 NOTE — Assessment & Plan Note (Signed)
She has trace ankle edema bilaterally but it does not look like it is pitting.  She has noticed maybe a little bit of improvement that she is been on the diuretic for the last 2 days we will continue that for now.  We need to recheck labs.

## 2019-08-02 ENCOUNTER — Ambulatory Visit (HOSPITAL_BASED_OUTPATIENT_CLINIC_OR_DEPARTMENT_OTHER)
Admission: RE | Admit: 2019-08-02 | Discharge: 2019-08-02 | Disposition: A | Discharge status: Home / Self Care | Payer: BC Managed Care – PPO | Source: Ambulatory Visit | Attending: Family Medicine | Admitting: Family Medicine

## 2019-08-02 DIAGNOSIS — M7989 Other specified soft tissue disorders: Secondary | ICD-10-CM | POA: Insufficient documentation

## 2019-08-02 DIAGNOSIS — R230 Cyanosis: Secondary | ICD-10-CM | POA: Insufficient documentation

## 2019-08-04 ENCOUNTER — Encounter: Payer: Self-pay | Admitting: Family Medicine

## 2019-08-04 DIAGNOSIS — R230 Cyanosis: Secondary | ICD-10-CM

## 2019-08-04 DIAGNOSIS — M7989 Other specified soft tissue disorders: Secondary | ICD-10-CM

## 2019-08-04 NOTE — Telephone Encounter (Signed)
Referral placed.

## 2019-08-05 ENCOUNTER — Encounter: Payer: Self-pay | Admitting: Family Medicine

## 2019-08-05 NOTE — Telephone Encounter (Signed)
Please advise if OK.

## 2019-08-08 ENCOUNTER — Ambulatory Visit (INDEPENDENT_AMBULATORY_CARE_PROVIDER_SITE_OTHER): Payer: BC Managed Care – PPO | Admitting: Family Medicine

## 2019-08-08 ENCOUNTER — Encounter: Payer: Self-pay | Admitting: Family Medicine

## 2019-08-08 ENCOUNTER — Ambulatory Visit (INDEPENDENT_AMBULATORY_CARE_PROVIDER_SITE_OTHER): Payer: BC Managed Care – PPO

## 2019-08-08 ENCOUNTER — Other Ambulatory Visit: Payer: Self-pay

## 2019-08-08 VITALS — BP 125/77 | HR 82 | Ht 66.0 in | Wt 168.0 lb

## 2019-08-08 DIAGNOSIS — R531 Weakness: Secondary | ICD-10-CM

## 2019-08-08 DIAGNOSIS — G43009 Migraine without aura, not intractable, without status migrainosus: Secondary | ICD-10-CM

## 2019-08-08 DIAGNOSIS — M7989 Other specified soft tissue disorders: Secondary | ICD-10-CM

## 2019-08-08 DIAGNOSIS — R21 Rash and other nonspecific skin eruption: Secondary | ICD-10-CM

## 2019-08-08 DIAGNOSIS — R5383 Other fatigue: Secondary | ICD-10-CM

## 2019-08-08 DIAGNOSIS — R590 Localized enlarged lymph nodes: Secondary | ICD-10-CM | POA: Diagnosis not present

## 2019-08-08 DIAGNOSIS — I889 Nonspecific lymphadenitis, unspecified: Secondary | ICD-10-CM

## 2019-08-08 MED ORDER — KETOROLAC TROMETHAMINE 60 MG/2ML IM SOLN
60.0000 mg | Freq: Once | INTRAMUSCULAR | Status: AC
  Administered 2019-08-08: 60 mg via INTRAMUSCULAR

## 2019-08-08 MED ORDER — IOHEXOL 300 MG/ML  SOLN
100.0000 mL | Freq: Once | INTRAMUSCULAR | Status: AC | PRN
Start: 2019-08-08 — End: 2019-08-08
  Administered 2019-08-08: 100 mL via INTRAVENOUS

## 2019-08-08 MED ORDER — PREDNISONE 20 MG PO TABS
40.0000 mg | ORAL_TABLET | Freq: Every day | ORAL | 0 refills | Status: DC
Start: 2019-08-08 — End: 2019-09-10

## 2019-08-08 NOTE — Progress Notes (Signed)
Established Patient Office Visit  Subjective:  Patient ID: Carol Diaz, female    DOB: 05-04-1965  Age: 54 y.o. MRN: [id number]  CC:  Chief Complaint  Patient presents with  . Edema    HPI Carol Diaz presents for f/U of lower ext swelling.  Feel are still blue but her swelling is better. She has had a migraine x 2 days.  Took Imitrex yesterday but not today.  Some nausea.    For the last 2 months has had some left  axillary pain and is sore. Hard to lay on that side to sleep.  Has tried heat and massage.  Her mammogram is up-to-date and she denies any palpable masses etc.  Has noticed a swollen LN on the right side of her neck. X 2-3 mo.  + tenderness. No redness or rash.     + nausea and diarrhea x 3 days.   Past Medical History:  Diagnosis Date  . Chronic fatigue syndrome   . Hypertension   . Migraines   . Palpitations   . Renal insufficiency     Past Surgical History:  Procedure Laterality Date  . CESAREAN SECTION    . ivp    . LAPAROSCOPIC APPENDECTOMY N/A 05/17/2017   Procedure: DIAGNOSTIC LAPAROSCOPY APPENDECTOMY;  Surgeon: Romie Levee, MD;  Location: [facility];  Service: General;  Laterality: N/A;  . TUBAL LIGATION      Family History  Problem Relation Age of Onset  . Emphysema Mother   . Hypertension Father   . Heart disease Brother 68       mi    Social History   Socioeconomic History  . Marital status: Married    Spouse name: Not on file  . Number of children: 2  . Years of education: Not on file  . Highest education level: Not on file  Occupational History    Employer: [employer]  Tobacco Use  . Smoking status: Never Smoker  . Smokeless tobacco: Never Used  Substance and Sexual Activity  . Alcohol use: No  . Drug use: No  . Sexual activity: Yes    Partners: Male    Birth control/protection: Pill  Other Topics Concern  . Not on file  Social History Narrative   Some exercise.    Social Determinants of Health    Financial Resource Strain:   . Difficulty of Paying Living Expenses:   Food Insecurity:   . Worried About Programme researcher, broadcasting/film/video in the Last Year:   . Barista in the Last Year:   Transportation Needs:   . Freight forwarder (Medical):   Marland Kitchen Lack of Transportation (Non-Medical):   Physical Activity:   . Days of Exercise per Week:   . Minutes of Exercise per Session:   Stress:   . Feeling of Stress :   Social Connections:   . Frequency of Communication with Friends and Family:   . Frequency of Social Gatherings with Friends and Family:   . Attends Religious Services:   . Active Member of Clubs or Organizations:   . Attends Banker Meetings:   Marland Kitchen Marital Status:   Intimate Partner Violence:   . Fear of Current or Ex-Partner:   . Emotionally Abused:   Marland Kitchen Physically Abused:   . Sexually Abused:     Outpatient Medications Prior to Visit  Medication Sig Dispense Refill  . acetaminophen (TYLENOL) 325 MG tablet Two tablets every 4 hours as needed for  pain.  You can buy over the counter at any drug store.    . cetirizine (ZYRTEC) 10 MG tablet TK 1 T PO Q 24 H    . cyanocobalamin (,VITAMIN B-12,) 1000 MCG/ML injection Inject 1,000 mcg into the muscle once a week.    . cyclobenzaprine (FLEXERIL) 10 MG tablet TAKE 1/2 TO 1 TABLET BY MOUTH THREE TIMES DAILY AS NEEDED FOR MUSCLE SPASMS 45 tablet 1  . diclofenac sodium (VOLTAREN) 1 % GEL Apply 4 g topically 4 (four) times daily. To affected joint. 100 g 11  . DULoxetine (CYMBALTA) 60 MG capsule Take 1 capsule (60 mg total) by mouth daily. 30 capsule 2  . fluticasone (FLONASE) 50 MCG/ACT nasal spray One spray in each nostril twice a day, use left hand for right nostril, and right hand for left nostril. 48 g 3  . furosemide (LASIX) 20 MG tablet Take 1 tablet (20 mg total) by mouth daily as needed for edema. 30 tablet 0  . levonorgestrel-ethinyl estradiol (INTROVALE) 0.15-0.03 MG tablet Take 1 tablet by mouth daily. 91 tablet  3  . LINZESS 290 MCG CAPS capsule TAKE ONE CAPSULE BY MOUTH DAILY, 30 MINUTES BEFORE FIRST MEAL OF THE DAY 90 capsule 1  . metFORMIN (GLUCOPHAGE) 500 MG tablet TAKE 1 TABLET BY MOUTH TWICE DAILY WITH A MEAL 180 tablet 1  . nabumetone (RELAFEN) 500 MG tablet TAKE 1 TABLET BY MOUTH AT BEDTIME AS NEEDED 90 tablet 1  . primidone (MYSOLINE) 50 MG tablet Take 50 mg by mouth 2 (two) times daily.    . propranolol ER (INDERAL LA) 60 MG 24 hr capsule TAKE ONE CAPSULE BY MOUTH EVERY DAY 90 capsule 1  . SUMAtriptan (IMITREX) 100 MG tablet TAKE 1 TABLET BY MOUTH EVERY 2 HOURS IF NEEDED FOR MIGRAINES AS DIRECTED 10 tablet 11  . Thiamine HCl (B-1) 100 MG TABS Take 1 tablet by mouth 2 (two) times daily.    Marland Kitchen topiramate (TOPAMAX) 50 MG tablet TAKE 1 TABLET(50 MG) BY MOUTH AT BEDTIME 90 tablet 3  . traMADol (ULTRAM) 50 MG tablet Take 50 mg by mouth 3 (three) times daily as needed.    . Vitamin D, Ergocalciferol, (DRISDOL) 1.25 MG (50000 UNIT) CAPS capsule Take 50,000 Units by mouth once a week.     No facility-administered medications prior to visit.    Allergies  Allergen Reactions  . Fluoxetine Other (See Comments)    Made her tremor worse   . Lexapro [Escitalopram Oxalate] Other (See Comments)    Headache  . Morphine Nausea And Vomiting  . Sertraline Other (See Comments)    Headache     ROS Review of Systems    Objective:    Physical Exam  Constitutional: She is oriented to person, place, and time. She appears well-developed and well-nourished.  HENT:  Head: Normocephalic and atraumatic.  She does have a palpable approximately 2 cm lymph node along the right anterior cervical area just below the jawline.  Tenderness on exam.  No surrounding erythema or rash.  No other palpable lymph nodes in the cervical area or supraclavicular area.  Eyes: Conjunctivae and EOM are normal.  Cardiovascular: Normal rate.  Pulmonary/Chest: Effort normal.  Musculoskeletal:     Comments: Left shoulder is  nontender.  No palpable masses or lymph nodes in the left axilla.  Nontender over the outer arm where she is also been having discomfort.  Neurological: She is alert and oriented to person, place, and time.  Skin: Skin is dry.  No pallor.  Psychiatric: She has a normal mood and affect. Her behavior is normal.  Vitals reviewed.   BP 125/77   Pulse 82   Ht 5\' 6"  (1.676 m)   Wt 168 lb (76.2 kg)   LMP 04/22/2018 (Exact Date)   SpO2 100%   BMI 27.12 kg/m  Wt Readings from Last 3 Encounters:  08/08/19 168 lb (76.2 kg)  08/01/19 167 lb (75.8 kg)  06/24/19 162 lb (73.5 kg)     There are no preventive care reminders to display for this patient.  There are no preventive care reminders to display for this patient.  Lab Results  Component Value Date   TSH 1.14 06/13/2019   Lab Results  Component Value Date   WBC 7.7 07/30/2019   HGB 13.7 07/30/2019   HCT 41.2 07/30/2019   MCV 92.2 07/30/2019   PLT 328 07/30/2019   Lab Results  Component Value Date   NA 136 07/30/2019   K 4.3 07/30/2019   CO2 23 07/30/2019   GLUCOSE 78 07/30/2019   BUN 18 07/30/2019   CREATININE 1.16 (H) 07/30/2019   BILITOT 0.3 07/30/2019   ALKPHOS 51 09/24/2015   AST 16 07/30/2019   ALT 14 07/30/2019   PROT 6.2 07/30/2019   ALBUMIN 3.9 09/24/2015   CALCIUM 9.5 07/30/2019   Lab Results  Component Value Date   CHOL 230 (H) 06/04/2019   Lab Results  Component Value Date   HDL 53 06/04/2019   Lab Results  Component Value Date   LDLCALC 141 (H) 06/04/2019   Lab Results  Component Value Date   TRIG 222 (H) 06/04/2019   Lab Results  Component Value Date   CHOLHDL 4.3 06/04/2019   Lab Results  Component Value Date   HGBA1C 5.3 01/01/2014      Assessment & Plan:   Problem List Items Addressed This Visit      Other   Localized swelling of both lower extremities - Primary   Relevant Orders   CT Abdomen Pelvis W Contrast (Completed)    Other Visit Diagnoses    Rash and nonspecific  skin eruption       Relevant Orders   CT Abdomen Pelvis W Contrast (Completed)   Lymphadenitis       Relevant Orders   CT Abdomen Pelvis W Contrast (Completed)   Cervical lymphadenopathy       Relevant Orders   03/03/2014 Soft Tissue Head/Neck (NON-THYROID)   Other fatigue       Weakness         Localized swelling of both lower extremities-it is improved today but she also did not go to work and has been elevating her feet.  She still has a bluish discoloration to her feet as well.  We did send a referral for vascular surgery but has not heard back from referral yet.  Low extremity Dopplers have been negative for DVT.  Labs have been unrevealing including normal kidney and thyroid function etc.  Normal sed rate.  We did do a pelvic/abdominal CT today just to rule out pelvic mass and it was normal just showed moderate amount of stool.  Cervical lymphadenopathy-she does have a enlarged right cervical lymph node just below the jawline.  Has been present for 2 to 3 months has gotten larger more recently.  We will move forward with further work-up including ultrasound.  She just had a CBC about a week ago that was completely normal.  Fatigue/weakness-again unclear etiology at this point.  She does have history of microscopic hematuria but even on her last urinalysis which did show hemoglobin on the dipstick they were no whole red blood cells seen under the microscopic evaluation  Migraine-she has had a persistent migraine for the last 2 days.  Given Toradol injection here in the office for more acute relief.  She has had some nausea today as well. Prednisone to see if helps with her migraines and pain.    Meds ordered this encounter  Medications  . predniSONE (DELTASONE) 20 MG tablet    Sig: Take 2 tablets (40 mg total) by mouth daily with breakfast.    Dispense:  10 tablet    Refill:  0    Follow-up: No follow-ups on file.    Nani Gasser, MD

## 2019-08-08 NOTE — Addendum Note (Signed)
Addended by: Deno Etienne on: 08/08/2019 05:26 PM   Modules accepted: Orders

## 2019-08-11 ENCOUNTER — Telehealth: Payer: Self-pay | Admitting: Family Medicine

## 2019-08-11 NOTE — Telephone Encounter (Signed)
Call pt and see how she is doing today after the prednisone?

## 2019-08-12 ENCOUNTER — Ambulatory Visit (INDEPENDENT_AMBULATORY_CARE_PROVIDER_SITE_OTHER): Payer: BC Managed Care – PPO

## 2019-08-12 ENCOUNTER — Other Ambulatory Visit: Payer: Self-pay

## 2019-08-12 DIAGNOSIS — R590 Localized enlarged lymph nodes: Secondary | ICD-10-CM | POA: Diagnosis not present

## 2019-08-12 NOTE — Telephone Encounter (Signed)
Patient states she feels a little better- not a whole lot, but also not worse.

## 2019-08-13 ENCOUNTER — Encounter: Payer: Self-pay | Admitting: Family Medicine

## 2019-08-13 DIAGNOSIS — R220 Localized swelling, mass and lump, head: Secondary | ICD-10-CM

## 2019-08-14 ENCOUNTER — Other Ambulatory Visit: Payer: Self-pay

## 2019-08-14 ENCOUNTER — Ambulatory Visit (INDEPENDENT_AMBULATORY_CARE_PROVIDER_SITE_OTHER): Payer: BC Managed Care – PPO

## 2019-08-14 DIAGNOSIS — R220 Localized swelling, mass and lump, head: Secondary | ICD-10-CM | POA: Diagnosis not present

## 2019-08-14 MED ORDER — IOHEXOL 300 MG/ML  SOLN
100.0000 mL | Freq: Once | INTRAMUSCULAR | Status: AC | PRN
  Administered 2019-08-14: 100 mL via INTRAVENOUS

## 2019-08-15 ENCOUNTER — Encounter: Payer: Self-pay | Admitting: Family Medicine

## 2019-08-15 MED ORDER — AMOXICILLIN-POT CLAVULANATE 875-125 MG PO TABS
1.0000 | ORAL_TABLET | Freq: Two times a day (BID) | ORAL | 0 refills | Status: DC
Start: 2019-08-15 — End: 2019-09-02

## 2019-08-15 NOTE — Telephone Encounter (Signed)
Can you please update patient on the status of this referral

## 2019-08-15 NOTE — Telephone Encounter (Signed)
Please check on vascular ref

## 2019-08-19 ENCOUNTER — Encounter: Payer: Self-pay | Admitting: Family Medicine

## 2019-08-19 DIAGNOSIS — R230 Cyanosis: Secondary | ICD-10-CM

## 2019-08-19 DIAGNOSIS — M7989 Other specified soft tissue disorders: Secondary | ICD-10-CM

## 2019-08-19 DIAGNOSIS — R6889 Other general symptoms and signs: Secondary | ICD-10-CM

## 2019-08-19 NOTE — Telephone Encounter (Signed)
Pended, ok to send?

## 2019-08-26 ENCOUNTER — Encounter: Payer: Self-pay | Admitting: Family Medicine

## 2019-08-29 ENCOUNTER — Telehealth: Payer: Self-pay | Admitting: Family Medicine

## 2019-08-29 NOTE — Telephone Encounter (Signed)
Patient advised.

## 2019-08-29 NOTE — Telephone Encounter (Signed)
PT requested a note to return to work on 09/01/19. Can read from mychart.  Please advise.

## 2019-08-29 NOTE — Telephone Encounter (Signed)
Ok for note 

## 2019-09-01 NOTE — Telephone Encounter (Signed)
error 

## 2019-09-02 ENCOUNTER — Other Ambulatory Visit: Payer: Self-pay | Admitting: *Deleted

## 2019-09-07 ENCOUNTER — Other Ambulatory Visit: Payer: Self-pay | Admitting: Family Medicine

## 2019-09-07 DIAGNOSIS — M545 Low back pain, unspecified: Secondary | ICD-10-CM

## 2019-09-10 ENCOUNTER — Other Ambulatory Visit: Payer: Self-pay | Admitting: *Deleted

## 2019-09-10 MED ORDER — PROPRANOLOL HCL ER 60 MG PO CP24: 60.0000 mg | ORAL_CAPSULE | Freq: Every day | ORAL | 1 refills | Status: DC

## 2019-09-22 ENCOUNTER — Encounter: Payer: Self-pay | Admitting: Family Medicine

## 2019-09-22 ENCOUNTER — Other Ambulatory Visit: Payer: Self-pay | Admitting: Neurology

## 2019-09-22 DIAGNOSIS — M542 Cervicalgia: Secondary | ICD-10-CM

## 2019-09-22 MED ORDER — DULOXETINE HCL 60 MG PO CPEP: 60.0000 mg | ORAL_CAPSULE | Freq: Every day | ORAL | 0 refills | Status: DC

## 2019-09-24 ENCOUNTER — Encounter: Payer: Self-pay | Admitting: Family Medicine

## 2019-09-24 NOTE — Telephone Encounter (Signed)
Pt is requesting cortisol testing. No reason why she is requesting the testing. Lab order pended for review.

## 2019-09-26 ENCOUNTER — Other Ambulatory Visit: Payer: Self-pay | Admitting: Family Medicine

## 2019-09-26 DIAGNOSIS — E162 Hypoglycemia, unspecified: Secondary | ICD-10-CM

## 2019-09-26 DIAGNOSIS — R635 Abnormal weight gain: Secondary | ICD-10-CM

## 2019-09-26 DIAGNOSIS — R8290 Unspecified abnormal findings in urine: Secondary | ICD-10-CM

## 2019-09-30 ENCOUNTER — Encounter: Payer: Self-pay | Admitting: Family Medicine

## 2019-09-30 DIAGNOSIS — E519 Thiamine deficiency, unspecified: Secondary | ICD-10-CM | POA: Insufficient documentation

## 2019-10-01 LAB — URINALYSIS, ROUTINE W REFLEX MICROSCOPIC
Bacteria, UA: NONE SEEN /HPF
Bilirubin Urine: NEGATIVE
Glucose, UA: NEGATIVE
Hyaline Cast: NONE SEEN /LPF
Ketones, ur: NEGATIVE
Leukocytes,Ua: NEGATIVE
Nitrite: NEGATIVE
Protein, ur: NEGATIVE
Specific Gravity, Urine: 1.022 (ref 1.001–1.03)
Squamous Epithelial / HPF: NONE SEEN /HPF (ref <=5)
WBC, UA: NONE SEEN /HPF (ref 0–5)
pH: 6.5 (ref 5.0–8.0)

## 2019-10-01 LAB — URINE CULTURE
MICRO NUMBER:: 10671632
Result:: NO GROWTH
SPECIMEN QUALITY:: ADEQUATE

## 2019-10-01 LAB — CORTISOL: Cortisol, Plasma: 13.8 ug/dL

## 2019-10-10 ENCOUNTER — Telehealth: Payer: Self-pay | Admitting: Family Medicine

## 2019-10-10 NOTE — Telephone Encounter (Signed)
My chart sent regarding Pap smear.

## 2019-11-09 ENCOUNTER — Other Ambulatory Visit: Payer: Self-pay | Admitting: Family Medicine

## 2019-12-07 ENCOUNTER — Other Ambulatory Visit: Payer: Self-pay | Admitting: Family Medicine

## 2019-12-07 DIAGNOSIS — M545 Low back pain, unspecified: Secondary | ICD-10-CM

## 2019-12-12 ENCOUNTER — Encounter: Payer: Self-pay | Admitting: Family Medicine

## 2019-12-12 ENCOUNTER — Telehealth (INDEPENDENT_AMBULATORY_CARE_PROVIDER_SITE_OTHER): Payer: BC Managed Care – PPO | Admitting: Family Medicine

## 2019-12-12 DIAGNOSIS — T148XXA Other injury of unspecified body region, initial encounter: Secondary | ICD-10-CM | POA: Diagnosis not present

## 2019-12-12 DIAGNOSIS — M25512 Pain in left shoulder: Secondary | ICD-10-CM | POA: Diagnosis not present

## 2019-12-12 DIAGNOSIS — R252 Cramp and spasm: Secondary | ICD-10-CM | POA: Diagnosis not present

## 2019-12-12 DIAGNOSIS — R110 Nausea: Secondary | ICD-10-CM | POA: Diagnosis not present

## 2019-12-12 NOTE — Patient Instructions (Signed)
Rotator Cuff Tear Rehab After Surgery Ask your health care provider which exercises are safe for you. Do exercises exactly as told by your health care provider and adjust them as directed. It is normal to feel mild stretching, pulling, tightness, or discomfort as you do these exercises. Stop right away if you feel sudden pain or your pain gets worse. Do not begin these exercises until told by your health care provider. Stretching and range-of-motion exercises These exercises warm up your muscles and joints and improve the movement and flexibility of your shoulder. These exercises also help to relieve pain. Shoulder pendulum In this exercise, you let the injured arm dangle toward the floor and then swing it like a clock pendulum. 1. Stand near a table or counter that you can hold onto for balance. 2. Bend forward at the waist and let your left / right arm hang straight down. Use your other arm to support you and help you stay balanced. 3. Relax your left / right arm and shoulder muscles, and move your hips and your trunk so your left / right arm swings freely. Your arm should swing because of the motion of your body, not because you are using your arm or shoulder muscles. 4. Keep moving your hips and trunk so your arm swings in the following directions, as told by your health care provider: ? Side to side. ? Forward and backward. ? In clockwise and counterclockwise circles. 5. Slowly return to the starting position. Repeat __________ times, or for __________ seconds per direction. Complete this exercise __________ times a day. Shoulder flexion, seated In this exercise, you raise your arm in front of your body until you feel a stretch in your injured shoulder. 1. Sit in a stable chair so your left / right forearm can rest on a flat surface. Your elbow should rest at a height that keeps your upper arm next to your body. 2. Keeping your left / right shoulder relaxed, lean forward at the waist and let  your hand slide forward (flexion). Stop when you feel a stretch in your shoulder, or when you reach the angle that is recommended by your health care provider. 3. Hold for __________ seconds. 4. Slowly return to the starting position. Repeat __________ times. Complete this exercise __________ times a day. Shoulder flexion, standing In this exercise, you raise your arm in front of your body (flexion) until you feel a stretch in your injured shoulder. 1. Stand and hold a broomstick, a cane, or a similar object. Place your hands a little more than shoulder width apart on the object. Your left / right hand should be palm-up, and your other hand should be palm-down. 2. Keep your elbow straight and your shoulder muscles relaxed. Push the stick up with your healthy arm to raise your left / right arm in front of your body, and then over your head until you feel a stretch in your shoulder. ? Avoid shrugging your shoulder while you raise your arm. Keep your shoulder blade tucked down toward the middle of your back. ? Keep your left / right shoulder muscles relaxed. 3. Hold for __________ seconds. 4. Slowly return to the starting position. Repeat __________ times. Complete this exercise __________ times a day. Shoulder abduction, active-assisted You will need a stick, broom handle, or similar object to help you (assist) in doing this exercise. 1. Lie on your back. This is the supine position. Hold a broomstick, a cane, or a similar object. 2. Place your hands a   little more than shoulder width apart on the object. Your left / right hand should be palm-up, and your other hand should be palm-down. 3. Keeping your shoulder relaxed, push the stick to raise your left / right arm out to your side (abduction) and then over your head. Use your other hand to help move the stick. Stop when you feel a stretch in your shoulder, or when you reach the angle that is recommended by your health care provider. ? Avoid shrugging  your shoulder while you raise your arm. Keep your shoulder blade tucked down toward the middle of your back. 4. Hold for __________ seconds. 5. Slowly return to the starting position. Repeat __________ times. Complete this exercise __________ times a day. Shoulder flexion, active-assisted  1. Lie on your back. You may bend your knees for comfort. 2. Hold a broomstick, a cane, or a similar object so that your hands are about shoulder width apart. Your palms should face toward your feet. 3. Raise your left / right arm over your head, then behind your head toward the floor (flexion). Use your other hand to help you do this (active-assisted). Stop when you feel a gentle stretch in your shoulder, or when you reach the angle that is recommended by your health care provider. 4. Hold for __________ seconds. 5. Use the stick and your other arm to help you return your left / right arm to the starting position. Repeat __________ times. Complete this exercise __________ times a day. External rotation  1. Sit in a stable chair without armrests, or stand up. 2. Tuck a soft object, such as a folded towel or a small ball, under your left / right upper arm. 3. Hold a broomstick, a cane, or a similar object with your palms face-down, toward the floor. Bend your elbows to a 90-degree angle (right angle), and keep your hands about shoulder width apart. 4. Straighten your healthy arm and push the stick across your body, toward your left / right side. Keep your left / right arm bent. This will rotate your left / right forearm away from your body (external rotation). 5. Hold for __________ seconds. 6. Slowly return to the starting position. Repeat __________ times. Complete this exercise __________ times a day. Strengthening exercises These exercises build strength and endurance in your shoulder. Endurance is the ability to use your muscles for a long time, even after they get tired. Shoulder flexion,  isometric  1. Stand or sit in a doorway, facing the door frame. 2. Keep your left / right arm straight and make a gentle fist with your hand. Place your fist against the door frame. Only your fist should be touching the frame. Keep your upper arm at your side. 3. Gently press your fist against the door frame, as if you are trying to raise your arm above your head (isometric shoulder flexion). ? Avoid shrugging your shoulder while you press your hand into the door frame. Keep your shoulder blade tucked down toward the middle of your back. 4. Hold for __________ seconds. 5. Slowly release the tension, and relax your muscles completely before you repeat the exercise. Repeat __________ times. Complete this exercise __________ times a day. Shoulder abduction, isometric 1. Stand or sit in a doorway. Your left / right arm should be closest to the door frame. 2. Keep your left / right arm straight, and place the back of your hand against the door frame. Only your hand should be touching the frame. Keep the rest   of your arm close to your side. 3. Gently press the back of your hand against the door frame, as if you are trying to raise your arm out to the side (isometric shoulder abduction). ? Avoid shrugging your shoulder while you press your hand into the door frame. Keep your shoulder blade tucked down toward the middle of your back. 4. Hold for __________ seconds. 5. Slowly release the tension, and relax your muscles completely before you repeat the exercise. Repeat __________ times. Complete this exercise __________ times a day. Internal rotation, isometric This is an exercise in which you press your palm against a door frame without moving your shoulder joint (isometric). 1. Stand or sit in a doorway, facing the door frame. 2. Bend your left / right elbow, and place the palm of your hand against the door frame. Only your palm should be touching the frame. Keep your upper arm at your side. 3. Gently  press your hand against the door frame, as if you are trying to push your arm toward your abdomen (internal rotation). Gradually increase the pressure until you are pressing as hard as you can. Stop increasing the pressure if you feel shoulder pain. ? Avoid shrugging your shoulder while you press your hand into the door frame. Keep your shoulder blade tucked down toward the middle of your back. 4. Hold for __________ seconds. 5. Slowly release the tension, and relax your muscles completely before you repeat the exercise. Repeat __________ times. Complete this exercise __________ times a day. External rotation, isometric This is an exercise in which you press the back of your wrist against a door frame without moving your shoulder joint (isometric). 1. Stand or sit in a doorway, facing the door frame. 2. Bend your left / right elbow and place the back of your wrist against the door frame. Only the back of your wrist should be touching the frame. Keep your upper arm at your side. 3. Gently press your wrist against the door frame, as if you are trying to push your arm away from your abdomen (external rotation). Gradually increase the pressure until you are pressing as hard as you can. Stop increasing the pressure if you feel pain. ? Avoid shrugging your shoulder while you press your wrist into the door frame. Keep your shoulder blade tucked down toward the middle of your back. 4. Hold for __________ seconds. 5. Slowly release the tension, and relax your muscles completely before you repeat the exercise. Repeat __________ times. Complete this exercise __________ times a day. This information is not intended to replace advice given to you by your health care provider. Make sure you discuss any questions you have with your health care provider. Document Revised: 07/05/2018 Document Reviewed: 06/25/2018 Elsevier Patient Education  2020 [employer]  

## 2019-12-12 NOTE — Progress Notes (Signed)
Virtual Visit via Video Note  I connected with Carol Diaz on 12/12/19 at  1:00 PM EDT by a video enabled telemedicine application and verified that I am speaking with the correct person using two identifiers.   I discussed the limitations of evaluation and management by telemedicine and the availability of in person appointments. The patient expressed understanding and agreed to proceed.  Patient location: Provider location: in office  Subjective:    CC:   HPI: Last time I saw her was in May 2021 at the time she had some new onset symptoms including local eye swelling of both lower extremities as well as a bluish discoloration of the feet.  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  She also had some cervical lymphadenopathy that she had noted around the jawline at that time.  She felt like it has been getting a little bit larger over a couple months timeframe so we ordered an ultrasound of the head and neck ultrasound showed a lymph node on the right side approximately 1.3 x 1.1 that was questionable.  They had recommended CT for further evaluation CT of the neck did not show any worrisome lymphadenopathy.  Did notice chronic right sinus inflammation so I called in antibiotics to treat this.  Placed on Augmentin for 21 days.  Should have completed the course at the end of June.  Legs and hips haven been more painful.  If sitting and gets up it is painful in her calves and her knees. Has been working a lot.  Charlie horse cramps at night and daytime.  Not well hydated.   She has had easy bruising and broken blood vessels on her skin.  On her arms and legs.  Some bleeding when she brushes her teeth. No nose bleeds.   Has been nauseated this week. Dec appetite. No diarrhea.  Twice has had reflux into her throat.     Past medical history, Surgical  history, Family history not pertinant except as noted below, Social history, Allergies, and medications have been entered into the medical record, reviewed, and corrections made.   Review of Systems: No fevers, chills, night sweats, weight loss, chest pain, or shortness of breath.   Objective:    General: Speaking clearly in complete sentences without any shortness of breath.  Alert and oriented x3.  Normal judgment. No apparent acute distress.    Impression and Recommendations:    No problem-specific Assessment & Plan notes found for this encounter.  Left shoulder pain, acute-seems most consistent with rotator cuff though unable to do exam but she is having pain when she gets to 90 degrees or higher and has pain reaching behind her back.  Sent exercises to do over my chart on her own at home recommend either heat or ice whichever feels better and if she is not improving over the next couple weeks let us get her in with our sports medicine doctor, Dr. Rodney Langton.  Hip and leg pain with cramping.  We will check for electrolyte disturbance, abnormal are elevated CK as well as anemia.  Nausea-unclear etiology though she has had a little bit more reflux than usual.  I think part of it is she is just exhausted she has been working a lot I think is just overtaxed herself.  She is out of work today and recommend that she be out of  work through Monday unless she is feeling significantly better.  Bruising-I am concerned it looks quite significant on the video screen edematous looks like senile purpura except she is too young to have that.  We will check a PT/INR, CBC and make sure that her platelets are normal.    Time spent in encounter 21 minutes  I discussed the assessment and treatment plan with the patient. The patient was provided an opportunity to ask questions and all were answered. The patient agreed with the plan and demonstrated an understanding of the instructions.   The  patient was advised to call back or seek an in-person evaluation if the symptoms worsen or if the condition fails to improve as anticipated.   Nani Gasser, MD

## 2019-12-12 NOTE — Progress Notes (Signed)
Really tired for the past 2 weeks. She has been staying late at work. On wednesday. She felt sick on her stomach.  She noticed some lesions on her arm that are red the spots are on her biceps that are concerning to her. She noticed these in the shower this morning.  She reports that her L arm has been hard for her to lift all week. She has no problems with grasping things in her hand. She also c/o pain in hips and legs. She hasn't been taking anything for the pain she said that she has just been trying to push through it. She became very tearful while speaking about her sxs.

## 2019-12-13 LAB — CBC
HCT: 39.9 % (ref 35.0–45.0)
Hemoglobin: 13.4 g/dL (ref 11.7–15.5)
MCH: 31.2 pg (ref 27.0–33.0)
MCHC: 33.6 g/dL (ref 32.0–36.0)
MCV: 93 fL (ref 80.0–100.0)
MPV: 10.1 fL (ref 7.5–12.5)
Platelets: 276 10*3/uL (ref 140–400)
RBC: 4.29 10*6/uL (ref 3.80–5.10)
RDW: 13.8 % (ref 11.0–15.0)
WBC: 5.7 10*3/uL (ref 3.8–10.8)

## 2019-12-13 LAB — COMPLETE METABOLIC PANEL WITH GFR
AG Ratio: 1.6 (calc) (ref 1.0–2.5)
ALT: 31 U/L — ABNORMAL HIGH (ref 6–29)
AST: 25 U/L (ref 10–35)
Albumin: 3.8 g/dL (ref 3.6–5.1)
Alkaline phosphatase (APISO): 67 U/L (ref 37–153)
BUN/Creatinine Ratio: 17 (calc) (ref 6–22)
BUN: 18 mg/dL (ref 7–25)
CO2: 25 mmol/L (ref 20–32)
Calcium: 9.6 mg/dL (ref 8.6–10.4)
Chloride: 108 mmol/L (ref 98–110)
Creat: 1.08 mg/dL — ABNORMAL HIGH (ref 0.50–1.05)
GFR, Est African American: 68 mL/min/{1.73_m2} (ref >=60)
GFR, Est Non African American: 59 mL/min/{1.73_m2} — ABNORMAL LOW (ref >=60)
Globulin: 2.4 g/dL (calc) (ref 1.9–3.7)
Glucose, Bld: 80 mg/dL (ref 65–139)
Potassium: 4.9 mmol/L (ref 3.5–5.3)
Sodium: 140 mmol/L (ref 135–146)
Total Bilirubin: 0.2 mg/dL (ref 0.2–1.2)
Total Protein: 6.2 g/dL (ref 6.1–8.1)

## 2019-12-13 LAB — MAGNESIUM: Magnesium: 2 mg/dL (ref 1.5–2.5)

## 2019-12-13 LAB — PROTIME-INR
INR: 0.9
Prothrombin Time: 9.7 s (ref 9.0–11.5)

## 2019-12-13 LAB — CK: Total CK: 93 U/L (ref 29–143)

## 2019-12-15 ENCOUNTER — Encounter: Payer: Self-pay | Admitting: Family Medicine

## 2019-12-15 DIAGNOSIS — M2550 Pain in unspecified joint: Secondary | ICD-10-CM

## 2019-12-15 DIAGNOSIS — R531 Weakness: Secondary | ICD-10-CM

## 2019-12-15 DIAGNOSIS — H538 Other visual disturbances: Secondary | ICD-10-CM

## 2019-12-16 MED ORDER — PREDNISONE 10 MG PO TABS: ORAL_TABLET | ORAL | 0 refills | Status: AC

## 2019-12-16 NOTE — Addendum Note (Signed)
Addended by: Nani Gasser D on: 12/16/2019 12:35 PM   Modules accepted: Orders

## 2019-12-17 NOTE — Addendum Note (Signed)
Addended by: Nani Gasser D on: 12/17/2019 12:42 PM   Modules accepted: Orders

## 2019-12-20 ENCOUNTER — Other Ambulatory Visit: Payer: Self-pay | Admitting: Physician Assistant

## 2019-12-20 DIAGNOSIS — M542 Cervicalgia: Secondary | ICD-10-CM

## 2019-12-29 ENCOUNTER — Other Ambulatory Visit: Payer: BC Managed Care – PPO

## 2020-01-05 ENCOUNTER — Ambulatory Visit (INDEPENDENT_AMBULATORY_CARE_PROVIDER_SITE_OTHER): Payer: BC Managed Care – PPO

## 2020-01-05 ENCOUNTER — Other Ambulatory Visit: Payer: Self-pay

## 2020-01-05 DIAGNOSIS — R531 Weakness: Secondary | ICD-10-CM | POA: Diagnosis not present

## 2020-01-05 DIAGNOSIS — H538 Other visual disturbances: Secondary | ICD-10-CM

## 2020-01-05 MED ORDER — GADOBUTROL 1 MMOL/ML IV SOLN
7.5000 mL | Freq: Once | INTRAVENOUS | Status: AC | PRN
  Administered 2020-01-05: 7.5 mL via INTRAVENOUS

## 2020-01-06 MED ORDER — AMOXICILLIN-POT CLAVULANATE 875-125 MG PO TABS: 1.0000 | ORAL_TABLET | Freq: Two times a day (BID) | ORAL | 0 refills | Status: DC

## 2020-01-07 ENCOUNTER — Encounter: Payer: Self-pay | Admitting: Family Medicine

## 2020-01-12 ENCOUNTER — Telehealth: Payer: Self-pay

## 2020-01-12 NOTE — Telephone Encounter (Signed)
Carol Diaz states since the start of the Augmentin she has been nauseated. She did start Methotrexate 2.5 mg 3 tablets once a week on the same day. She is taking the Augmentin on a full stomach.   Added Methotrexate to current medication list.

## 2020-01-13 MED ORDER — ONDANSETRON 4 MG PO TBDP: 4.0000 mg | ORAL_TABLET | Freq: Three times a day (TID) | ORAL | 0 refills | Status: DC | PRN

## 2020-01-13 NOTE — Telephone Encounter (Signed)
Zofran sent to pharmacy

## 2020-01-13 NOTE — Telephone Encounter (Signed)
She would like to try using an anti-nausea medication.

## 2020-01-13 NOTE — Telephone Encounter (Signed)
Certainly could be the Augmentin.  Would she like to have nausea medicine to take with it or does she want me to just go ahead and change the antibiotic completely.  I am okay with either.  Just let me know.

## 2020-01-14 NOTE — Telephone Encounter (Signed)
Patient advised.

## 2020-01-19 ENCOUNTER — Encounter: Payer: Self-pay | Admitting: Family Medicine

## 2020-01-20 MED ORDER — DOXYCYCLINE HYCLATE 100 MG PO TABS: 100.0000 mg | ORAL_TABLET | Freq: Two times a day (BID) | ORAL | 0 refills | Status: DC

## 2020-01-20 NOTE — Telephone Encounter (Signed)
OK, WE CAN TRY doxycycline.  New prescription sent to pharmacy.

## 2020-01-29 ENCOUNTER — Telehealth: Payer: Self-pay | Admitting: Family Medicine

## 2020-01-29 NOTE — Telephone Encounter (Signed)
Please call patient and remind her she is due for a Pap smear her last one was about 6 years ago and less she has gone to a GYN somewhere if she has lets get that information so we can get a copy of the report otherwise please let us get her scheduled at her earliest convenience.

## 2020-01-30 NOTE — Telephone Encounter (Signed)
Left voicemail for patient to call us back.

## 2020-03-05 ENCOUNTER — Other Ambulatory Visit: Payer: Self-pay | Admitting: Family Medicine

## 2020-03-05 DIAGNOSIS — M545 Low back pain, unspecified: Secondary | ICD-10-CM

## 2020-03-08 ENCOUNTER — Other Ambulatory Visit: Payer: Self-pay | Admitting: Family Medicine

## 2020-03-17 ENCOUNTER — Telehealth (INDEPENDENT_AMBULATORY_CARE_PROVIDER_SITE_OTHER): Payer: BC Managed Care – PPO | Admitting: Physician Assistant

## 2020-03-17 ENCOUNTER — Encounter: Payer: Self-pay | Admitting: Physician Assistant

## 2020-03-17 VITALS — BP 118/78 | HR 92 | Temp 99.0°F | Ht 66.0 in | Wt 168.0 lb

## 2020-03-17 DIAGNOSIS — M3500 Sicca syndrome, unspecified: Secondary | ICD-10-CM | POA: Diagnosis not present

## 2020-03-17 DIAGNOSIS — J0120 Acute ethmoidal sinusitis, unspecified: Secondary | ICD-10-CM

## 2020-03-17 HISTORY — DX: Sjogren syndrome, unspecified: M35.00

## 2020-03-17 MED ORDER — DOXYCYCLINE HYCLATE 100 MG PO TABS: 100.0000 mg | ORAL_TABLET | Freq: Two times a day (BID) | ORAL | 0 refills | Status: DC

## 2020-03-17 NOTE — Progress Notes (Deleted)
SYMPTOMS started one week ago Sinus headache Tenderness in face Lightheaded  No sore throat, cough  Has been taking tylenol/advil, no other OTC meds

## 2020-03-17 NOTE — Progress Notes (Signed)
Patient ID: Carol Diaz, female   DOB: 01/03/1966, 54 y.o.   MRN: [id number] .Marland KitchenVirtual Visit via Video Note  I connected with Carol Diaz on 03/17/20 at  9:30 AM EST by a video enabled telemedicine application and verified that I am speaking with the correct person using two identifiers.  Location: Patient: home Provider: clinic  .Marland KitchenParticipating in visit:  Patient: Carol Diaz Provider: Tandy Gaw PA-C   I discussed the limitations of evaluation and management by telemedicine and the availability of in person appointments. The patient expressed understanding and agreed to proceed.  History of Present Illness: Patient is a 54 year old female with Sjogren's disease and on methotrexate who calls into the clinic with sinus pressure, headache, congestion for last week. She has been taking tylenol/advil but nothing else. No fever, chills, body aches, loss of smell or taste, GI symptoms, cough or SOB. She has not been fully vaccinated against covid.   .. Active Ambulatory Problems    Diagnosis Date Noted  . HYPOGLYCEMIA, UNSPECIFIED 11/20/2008  . Iron deficiency anemia 09/22/2009  . DEPRESSION, MAJOR, RECURRENT 01/02/2006  . Migraine headache 01/02/2006  . IRRITABLE BOWEL SYNDROME 01/02/2006  . MENORRHAGIA 09/05/2007  . INSOMNIA NOS 01/02/2006  . FATIGUE 08/31/2009  . PALPITATIONS 09/22/2009  . GROSS HEMATURIA 04/06/2010  . Hyperlipidemia 12/15/2011  . Essential tremor 02/02/2012  . Obesity 09/25/2013  . Neck pain 06/10/2015  . CKD (chronic kidney disease) stage 2, GFR 60-89 ml/min 09/27/2015  . Fibromyalgia 07/17/2016  . Abnormal ankle brachial index (ABI) 10/07/2018  . Tachycardia 06/13/2019  . Localized swelling of both lower extremities 08/01/2019  . Blue toes 08/01/2019  . Thiamin deficiency 09/30/2019  . Sjogren's disease (HCC) 03/17/2020   Resolved Ambulatory Problems    Diagnosis Date Noted  . Essential hypertension, benign 12/11/2008  . JOINT STIFFNESS, HAND  03/10/2009  . SYNCOPE 11/13/2008  . TREMOR 03/13/2007  . CHEST PAIN, LEFT 08/31/2009  . VIRAL INFECTION 04/06/2010  . Contusion of right foot 01/01/2014  . Acute frontal sinusitis 02/10/2014  . Acute abdominal pain in right lower quadrant 05/15/2017  . Abdominal pain 05/15/2017  . Abnormal weight 12/20/2018   Past Medical History:  Diagnosis Date  . Chronic fatigue syndrome   . Hypertension   . Migraines   . Renal insufficiency    Reviewed med, allergy, problem list.     Observations/Objective: No acute distress Normal mood and appearance.  Normal breathing. No wheezing or coughing.   .. Today's Vitals   03/17/20 0844  BP: 118/78  Pulse: 92  Temp: 99 F (37.2 C)  TempSrc: Oral  Weight: 168 lb (76.2 kg)  Height: 5\' 6"  (1.676 m)   Body mass index is 27.12 kg/m.    Assessment and Plan: Marland KitchenDesta was seen today for sinus problem.  Diagnoses and all orders for this visit:  Acute non-recurrent ethmoidal sinusitis -     doxycycline (VIBRA-TABS) 100 MG tablet; Take 1 tablet (100 mg total) by mouth 2 (two) times daily.  Sjogren's syndrome, with unspecified organ involvement [facility])   Discussed with patient cannot completely rule out covid. Out of window to test. Symptomatic care discussed. No overt signs of bacterial infection but has been 1 week of symptoms. Start flonase first if no benefit or worsening symptoms in next 1-2 days start doxycycline due to interaction of PcN and methotrexate.   Follow Up Instructions:    I discussed the assessment and treatment plan with the patient. The patient was provided an opportunity to ask  questions and all were answered. The patient agreed with the plan and demonstrated an understanding of the instructions.   The patient was advised to call back or seek an in-person evaluation if the symptoms worsen or if the condition fails to improve as anticipated.    Tandy Gaw, PA-C

## 2020-03-20 ENCOUNTER — Other Ambulatory Visit: Payer: Self-pay | Admitting: Family Medicine

## 2020-03-20 DIAGNOSIS — M542 Cervicalgia: Secondary | ICD-10-CM

## 2020-03-28 ENCOUNTER — Other Ambulatory Visit: Payer: Self-pay | Admitting: Family Medicine

## 2020-04-05 ENCOUNTER — Encounter: Payer: Self-pay | Admitting: Family Medicine

## 2020-04-05 MED ORDER — CEFDINIR 300 MG PO CAPS: 300.0000 mg | ORAL_CAPSULE | Freq: Two times a day (BID) | ORAL | 0 refills | Status: DC

## 2020-04-24 ENCOUNTER — Other Ambulatory Visit: Payer: Self-pay | Admitting: Family Medicine

## 2020-05-31 ENCOUNTER — Encounter: Payer: Self-pay | Admitting: Family Medicine

## 2020-06-01 ENCOUNTER — Ambulatory Visit: Payer: BC Managed Care – PPO | Admitting: Physician Assistant

## 2020-06-01 ENCOUNTER — Other Ambulatory Visit: Payer: Self-pay

## 2020-06-01 VITALS — BP 140/86 | HR 88 | Ht 66.0 in | Wt 158.0 lb

## 2020-06-01 DIAGNOSIS — K1379 Other lesions of oral mucosa: Secondary | ICD-10-CM | POA: Diagnosis not present

## 2020-06-01 DIAGNOSIS — B370 Candidal stomatitis: Secondary | ICD-10-CM | POA: Diagnosis not present

## 2020-06-01 DIAGNOSIS — Z79899 Other long term (current) drug therapy: Secondary | ICD-10-CM

## 2020-06-01 DIAGNOSIS — J0100 Acute maxillary sinusitis, unspecified: Secondary | ICD-10-CM | POA: Diagnosis not present

## 2020-06-01 DIAGNOSIS — M3500 Sicca syndrome, unspecified: Secondary | ICD-10-CM

## 2020-06-01 MED ORDER — FLUCONAZOLE 150 MG PO TABS: ORAL_TABLET | ORAL | 0 refills | Status: DC

## 2020-06-01 MED ORDER — AMBULATORY NON FORMULARY MEDICATION: 0 refills | Status: DC

## 2020-06-01 MED ORDER — AZITHROMYCIN 250 MG PO TABS: ORAL_TABLET | ORAL | 0 refills | Status: DC

## 2020-06-01 MED ORDER — MAGIC MOUTHWASH W/LIDOCAINE: 10.0000 mL | Freq: Four times a day (QID) | ORAL | 0 refills | Status: DC

## 2020-06-01 NOTE — Progress Notes (Signed)
Subjective:    Patient ID: Carol Diaz, female    DOB: 1966/03/07, 55 y.o.   MRN: [id number]  HPI  Patient is a 55 year old female with Sjogren's disease and on methotrexate, prednisone, folic acid who presents to the clinic with the last week of worsening mouth pain and white patches.  She called her rheumatologist and he told her to start Biotene mouthwash.  That has not been helping.  She is also had sinus pressure and congestion and blowing out green sputum for the last week as well.  She denies any fever, chills, shortness of breath, ear pain.  I confirm she is taking her methotrexate weekly and her folic acid daily.  .. Active Ambulatory Problems    Diagnosis Date Noted  . HYPOGLYCEMIA, UNSPECIFIED 11/20/2008  . Iron deficiency anemia 09/22/2009  . DEPRESSION, MAJOR, RECURRENT 01/02/2006  . Migraine headache 01/02/2006  . IRRITABLE BOWEL SYNDROME 01/02/2006  . MENORRHAGIA 09/05/2007  . INSOMNIA NOS 01/02/2006  . FATIGUE 08/31/2009  . PALPITATIONS 09/22/2009  . GROSS HEMATURIA 04/06/2010  . Hyperlipidemia 12/15/2011  . Essential tremor 02/02/2012  . Obesity 09/25/2013  . Neck pain 06/10/2015  . CKD (chronic kidney disease) stage 2, GFR 60-89 ml/min 09/27/2015  . Fibromyalgia 07/17/2016  . Abnormal ankle brachial index (ABI) 10/07/2018  . Tachycardia 06/13/2019  . Localized swelling of both lower extremities 08/01/2019  . Blue toes 08/01/2019  . Thiamin deficiency 09/30/2019  . Sjogren's disease (HCC) 03/17/2020   Resolved Ambulatory Problems    Diagnosis Date Noted  . Essential hypertension, benign 12/11/2008  . JOINT STIFFNESS, HAND 03/10/2009  . SYNCOPE 11/13/2008  . TREMOR 03/13/2007  . CHEST PAIN, LEFT 08/31/2009  . VIRAL INFECTION 04/06/2010  . Contusion of right foot 01/01/2014  . Acute frontal sinusitis 02/10/2014  . Acute abdominal pain in right lower quadrant 05/15/2017  . Abdominal pain 05/15/2017  . Abnormal weight 12/20/2018   Past Medical  History:  Diagnosis Date  . Chronic fatigue syndrome   . Hypertension   . Migraines   . Renal insufficiency      Review of Systems See HPI.     Objective:   Physical Exam Vitals reviewed.  Constitutional:      Appearance: Normal appearance.  HENT:     Head: Normocephalic.     Comments: Tenderness over maxillary sinuses to palpation.     Right Ear: Tympanic membrane, ear canal and external ear normal. There is no impacted cerumen.     Left Ear: Tympanic membrane, ear canal and external ear normal. There is no impacted cerumen.     Nose: Congestion present.     Mouth/Throat:     Mouth: Mucous membranes are dry.     Comments: White patches on sides of tongue and bilateral buccal mucosa. No ulcers seen.  Eyes:     Conjunctiva/sclera: Conjunctivae normal.     Pupils: Pupils are equal, round, and reactive to light.  Neck:     Comments: Tender and mildly enlarged lymphnodes of the anterior cervical chain on the right side.  Cardiovascular:     Rate and Rhythm: Normal rate and regular rhythm.     Pulses: Normal pulses.  Pulmonary:     Effort: Pulmonary effort is normal.     Breath sounds: Normal breath sounds. No wheezing or rhonchi.  Lymphadenopathy:     Cervical: Cervical adenopathy present.  Neurological:     General: No focal deficit present.     Mental Status: She is alert and oriented  to person, place, and time.  Psychiatric:        Mood and Affect: Mood normal.           Assessment & Plan:  Marland KitchenMarland KitchenShaela was seen today for mouth lesions.  Diagnoses and all orders for this visit:  Oral yeast infection -     fluconazole (DIFLUCAN) 150 MG tablet; Repeat x1 every 72 hours until 2 treatments after finish antibiotic. -     CBC with Differential/Platelet -     Methotrexate level -     magic mouthwash w/lidocaine SOLN; Take 10 mLs by mouth 4 (four) times daily. Swish for 2 minutes then spit out. -     AMBULATORY NON FORMULARY MEDICATION; 1 part mucositis( 1:1:1 of maalox  extra strenth/diphenhydramine/nystatin) and 1 part vicious lidocaine 2 percent swish and spit 51mL QID as needed.  Acute non-recurrent maxillary sinusitis -     azithromycin (ZITHROMAX Z-PAK) 250 MG tablet; Take 2 tablets (500 mg) on  Day 1,  followed by 1 tablet (250 mg) once daily on Days 2 through 5.  Medication management -     CBC with Differential/Platelet -     Methotrexate level  Mouth pain -     CBC with Differential/Platelet -     Methotrexate level  Sjogren's syndrome, with unspecified organ involvement [facility])   Patient has actually been on her methotrexate for at least 4 months with no problems.  The dose has not been changed.  I do not think it is any methotrexate side effects.  I will check a methotrexate level today and CBC.  She has been tapering down on her prednisone.  Her exam today looks more like yeast in the mouth.  She does have signs and symptoms consistent with sinus infection.  Started on antibiotic, zpak, however antibiotics can increase probability of yeast.  Placed on Diflucan every 3 days until after she finishes antibiotic and then 2 doses after.  I will send her over a mouthwash for yeast in mouth.  Follow-up with any worsening symptoms.

## 2020-06-01 NOTE — Patient Instructions (Signed)
Start zpak and diflucan.  Mouthwash while on antibiotic and a after as needed.

## 2020-06-02 ENCOUNTER — Encounter: Payer: Self-pay | Admitting: Physician Assistant

## 2020-06-02 NOTE — Progress Notes (Signed)
Pending methotrexate level.  CBC looks good.

## 2020-06-04 LAB — CBC WITH DIFFERENTIAL/PLATELET
Absolute Monocytes: 912 {cells}/uL (ref 200–950)
Basophils Absolute: 24 {cells}/uL (ref 0–200)
Basophils Relative: 0.3 %
Eosinophils Absolute: 80 {cells}/uL (ref 15–500)
Eosinophils Relative: 1 %
HCT: 39.9 % (ref 35.0–45.0)
Hemoglobin: 13.4 g/dL (ref 11.7–15.5)
Lymphs Abs: 1584 {cells}/uL (ref 850–3900)
MCH: 32.3 pg (ref 27.0–33.0)
MCHC: 33.6 g/dL (ref 32.0–36.0)
MCV: 96.1 fL (ref 80.0–100.0)
MPV: 9.9 fL (ref 7.5–12.5)
Monocytes Relative: 11.4 %
Neutro Abs: 5400 {cells}/uL (ref 1500–7800)
Neutrophils Relative %: 67.5 %
Platelets: 288 10*3/uL (ref 140–400)
RBC: 4.15 Million/uL (ref 3.80–5.10)
RDW: 14.3 % (ref 11.0–15.0)
Total Lymphocyte: 19.8 %
WBC: 8 10*3/uL (ref 3.8–10.8)

## 2020-06-04 LAB — METHOTREXATE LEVEL

## 2020-06-04 NOTE — Progress Notes (Signed)
Unfortunately methotrexate level was not protected appropriately from light and not invalid.   How are you feeling?

## 2020-06-07 ENCOUNTER — Encounter: Payer: Self-pay | Admitting: Physician Assistant

## 2020-06-08 ENCOUNTER — Other Ambulatory Visit: Payer: Self-pay | Admitting: Family Medicine

## 2020-06-08 DIAGNOSIS — M545 Low back pain, unspecified: Secondary | ICD-10-CM

## 2020-06-20 ENCOUNTER — Other Ambulatory Visit: Payer: Self-pay | Admitting: Family Medicine

## 2020-06-20 DIAGNOSIS — M542 Cervicalgia: Secondary | ICD-10-CM

## 2020-07-19 ENCOUNTER — Encounter: Payer: Self-pay | Admitting: Family Medicine

## 2020-07-19 ENCOUNTER — Telehealth: Payer: Self-pay | Admitting: Family Medicine

## 2020-07-19 ENCOUNTER — Telehealth: Payer: Self-pay | Admitting: General Practice

## 2020-07-19 ENCOUNTER — Ambulatory Visit (INDEPENDENT_AMBULATORY_CARE_PROVIDER_SITE_OTHER): Payer: BC Managed Care – PPO | Admitting: Family Medicine

## 2020-07-19 ENCOUNTER — Other Ambulatory Visit: Payer: Self-pay

## 2020-07-19 VITALS — BP 111/68 | HR 92 | Ht 66.0 in | Wt 150.0 lb

## 2020-07-19 DIAGNOSIS — R748 Abnormal levels of other serum enzymes: Secondary | ICD-10-CM

## 2020-07-19 DIAGNOSIS — R0789 Other chest pain: Secondary | ICD-10-CM | POA: Diagnosis not present

## 2020-07-19 DIAGNOSIS — E876 Hypokalemia: Secondary | ICD-10-CM

## 2020-07-19 DIAGNOSIS — G43009 Migraine without aura, not intractable, without status migrainosus: Secondary | ICD-10-CM | POA: Diagnosis not present

## 2020-07-19 DIAGNOSIS — R9431 Abnormal electrocardiogram [ECG] [EKG]: Secondary | ICD-10-CM

## 2020-07-19 MED ORDER — SUMATRIPTAN SUCCINATE 100 MG PO TABS: ORAL_TABLET | ORAL | 11 refills | Status: DC

## 2020-07-19 NOTE — Telephone Encounter (Signed)
Please disregard, patient is coming in today.

## 2020-07-19 NOTE — Telephone Encounter (Signed)
Transition Care Management Follow-up Telephone Call Date of discharge and from where: [facility] 07/17/20 How have you been since you were released from the hospital? Still feeling weak.  Any questions or concerns? No  Items Reviewed: Did the pt receive and understand the discharge instructions provided? Yes  Medications obtained and verified? Yes  Other? No  Any new allergies since your discharge? No  Dietary orders reviewed? Yes Do you have support at home? Yes   Home Care and Equipment/Supplies: Were home health services ordered? no   Functional Questionnaire: (I = Independent and D = Dependent) ADLs: I  Bathing/Dressing- I  Meal Prep- I  Eating- I  Maintaining continence- I  Transferring/Ambulation- I  Managing Meds- I  Follow up appointments reviewed:  PCP Hospital f/u appt confirmed? Yes  Scheduled to see Dr. Linford Arnold on 07/19/20 @ 1420. Specialist Hospital f/u appt confirmed? No   Are transportation arrangements needed? No  If their condition worsens, is the pt aware to call PCP or go to the Emergency Dept.? Yes Was the patient provided with contact information for the PCP's office or ED? Yes Was to pt encouraged to call back with questions or concerns? Yes

## 2020-07-19 NOTE — Telephone Encounter (Signed)
Please call patient: Overall the EKG actually looked pretty good but in one her lead she did have what looks like a possible old Q wave and it is different than an old EKG that we had done back in 2011.  So I do want to get her in with cardiology.  Its not urgent but I do want to get her in in the next month if possible.  Referral placed.

## 2020-07-19 NOTE — Telephone Encounter (Signed)
Patient was seen at [facility] for dehydration and the blood work didn't come back good. Wanted to see Dr.Metheney but I did book her with taylor Reola Calkins if PCP can not work her in. Please advise.

## 2020-07-19 NOTE — Progress Notes (Signed)
Established Patient Office Visit  Subjective:  Patient ID: Carol Diaz, female    DOB: 05-Dec-1965  Age: 55 y.o. MRN: [id number]  CC:  Chief Complaint  Patient presents with  . Hospitalization Follow-up    HPI BRILYNN BIASI presents for ED follow-up.  She had initially had a stomach bug with mostly vomiting.  She said she got so weak that she could actually barely stand up and so she went to the emergency department on April 25.  While there she was given some IV fluids.  She was also having some intermittent left-sided chest pain at that time so they worked that up as well according to the note she had an abnormal EKG with a Q wave and some possible T wave abnormality.  She had fairly normal labs except for mild elevation in liver enzymes ALT was 42 and AST was 42.  She also had significant hypokalemia with potassium of 3.0.  She also recently been started on Lasix for elevated blood pressures, 150/100, at her rheumatologist office but had not had a repeat kidney or potassium checked yet before she became sick.  She did have a little bit of mild diarrhea after the ED visit but says that resolved pretty quickly.  She has been taking her omeprazole daily.  Has still been experiencing some intermittent chest pain that radiates into her back.  It can be quite uncomfortable at times.  She did have a normal lipase level and a normal chest x-ray as well as troponins while in the emergency room.  She still feels fatigued and weak but overall is better she is no longer vomiting.  She reports her home blood pressures since being home have actually been good around 128/78.  She feels like her heart rate has calmed back down as well.  ED notes labs and imaging reviewed.  Old EKG reviewed.  Past Medical History:  Diagnosis Date  . Chronic fatigue syndrome   . Hypertension   . Migraines   . Palpitations   . Renal insufficiency   . Sjogren's disease (HCC) 03/17/2020    Past Surgical History:   Procedure Laterality Date  . CESAREAN SECTION    . ivp    . LAPAROSCOPIC APPENDECTOMY N/A 05/17/2017   Procedure: DIAGNOSTIC LAPAROSCOPY APPENDECTOMY;  Surgeon: Romie Levee, MD;  Location: [facility];  Service: General;  Laterality: N/A;  . TUBAL LIGATION      Family History  Problem Relation Age of Onset  . Emphysema Mother   . Hypertension Father   . Heart disease Brother 36       mi    Social History   Socioeconomic History  . Marital status: Married    Spouse name: Not on file  . Number of children: 2  . Years of education: Not on file  . Highest education level: Not on file  Occupational History    Employer: [employer]  Tobacco Use  . Smoking status: Never Smoker  . Smokeless tobacco: Never Used  Substance and Sexual Activity  . Alcohol use: No  . Drug use: No  . Sexual activity: Yes    Partners: Male    Birth control/protection: Pill  Other Topics Concern  . Not on file  Social History Narrative   Some exercise.    Social Determinants of Health   Financial Resource Strain: Not on file  Food Insecurity: Not on file  Transportation Needs: Not on file  Physical Activity: Not on file  Stress:  Not on file  Social Connections: Not on file  Intimate Partner Violence: Not on file    Outpatient Medications Prior to Visit  Medication Sig Dispense Refill  . cetirizine (ZYRTEC) 10 MG tablet TK 1 T PO Q 24 H    . Vitamin D, Ergocalciferol, (DRISDOL) 1.25 MG (50000 UNIT) CAPS capsule Take 50,000 Units by mouth once a week.    . cyanocobalamin (,VITAMIN B-12,) 1000 MCG/ML injection every 30 (thirty) days.    . cyclobenzaprine (FLEXERIL) 10 MG tablet TAKE 1/2 TO 1 TABLET BY MOUTH THREE TIMES DAILY AS NEEDED FOR MUSCLE SPASMS 45 tablet 1  . diclofenac sodium (VOLTAREN) 1 % GEL Apply 4 g topically 4 (four) times daily. To affected joint. 100 g 11  . DULoxetine (CYMBALTA) 60 MG capsule TAKE 1 CAPSULE(60 MG) BY MOUTH DAILY 90 capsule 0  . fluticasone  (FLONASE) 50 MCG/ACT nasal spray One spray in each nostril twice a day, use left hand for right nostril, and right hand for left nostril. 48 g 3  . folic acid (FOLVITE) 1 MG tablet Take 1 mg by mouth daily.    Marland Kitchen. LINZESS 290 MCG CAPS capsule TAKE ONE CAPSULE BY MOUTH DAILY, 30 MINUTES BEFORE FIRST MEAL OF THE DAY 90 capsule 1  . ondansetron (ZOFRAN ODT) 4 MG disintegrating tablet Take 1 tablet (4 mg total) by mouth every 8 (eight) hours as needed for nausea or vomiting. 20 tablet 0  . primidone (MYSOLINE) 50 MG tablet Take 50 mg by mouth 2 (two) times daily.    . propranolol ER (INDERAL LA) 60 MG 24 hr capsule TAKE 1 CAPSULE(60 MG) BY MOUTH DAILY 90 capsule 1  . Thiamine HCl (B-1) 100 MG TABS Take 1 tablet by mouth 2 (two) times daily.    . traMADol (ULTRAM) 50 MG tablet Take 50 mg by mouth 3 (three) times daily as needed.    . AMBULATORY NON FORMULARY MEDICATION 1 part mucositis( 1:1:1 of maalox extra strenth/diphenhydramine/nystatin) and 1 part vicious lidocaine 2 percent swish and spit 30mL QID as needed. 500 mL 0  . azithromycin (ZITHROMAX Z-PAK) 250 MG tablet Take 2 tablets (500 mg) on  Day 1,  followed by 1 tablet (250 mg) once daily on Days 2 through 5. 6 tablet 0  . cevimeline (EVOXAC) 30 MG capsule Take 30 mg by mouth 3 (three) times daily.    . fluconazole (DIFLUCAN) 150 MG tablet Repeat x1 every 72 hours until 2 treatments after finish antibiotic. 7 tablet 0  . magic mouthwash w/lidocaine SOLN Take 10 mLs by mouth 4 (four) times daily. Swish for 2 minutes then spit out. 500 mL 0  . metFORMIN (GLUCOPHAGE) 500 MG tablet TAKE 1 TABLET BY MOUTH TWICE DAILY WITH A MEAL 180 tablet 1  . methotrexate 2.5 MG tablet Take 7.5 mg by mouth every 7 (seven) days.    . nabumetone (RELAFEN) 500 MG tablet TAKE 1 TABLET BY MOUTH AT BEDTIME AS NEEDED 90 tablet 1  . predniSONE (DELTASONE) 5 MG tablet Take by mouth.    . pregabalin (LYRICA) 75 MG capsule Take 75 mg by mouth in the morning and at bedtime.    .  SUMAtriptan (IMITREX) 100 MG tablet TAKE 1 TABLET BY MOUTH EVERY 2 HOURS AS NEEDED MIGRAINES AS DIRECTED 10 tablet 11  . topiramate (TOPAMAX) 50 MG tablet TAKE 1 TABLET(50 MG) BY MOUTH AT BEDTIME 90 tablet 3   No facility-administered medications prior to visit.    Allergies  Allergen Reactions  .  Fluoxetine Other (See Comments)    Made her tremor worse   . Lexapro [Escitalopram Oxalate] Other (See Comments)    Headache  . Morphine Nausea And Vomiting  . Sertraline Other (See Comments)    Headache     ROS Review of Systems    Objective:    Physical Exam  BP 111/68   Pulse 92   Ht 5\' 6"  (1.676 m)   Wt 150 lb (68 kg)   LMP 04/22/2018 (Exact Date)   SpO2 100%   BMI 24.21 kg/m  Wt Readings from Last 3 Encounters:  07/19/20 150 lb (68 kg)  06/01/20 158 lb (71.7 kg)  03/17/20 168 lb (76.2 kg)     Health Maintenance Due  Topic Date Due  . Hepatitis C Screening  Never done  . PAP SMEAR-Modifier  01/27/2019  . COVID-19 Vaccine (2 - Pfizer 3-dose series) 06/14/2019  . MAMMOGRAM  01/29/2020  . TETANUS/TDAP  03/15/2020    There are no preventive care reminders to display for this patient.  Lab Results  Component Value Date   TSH 1.14 06/13/2019   Lab Results  Component Value Date   WBC 8.0 06/01/2020   HGB 13.4 06/01/2020   HCT 39.9 06/01/2020   MCV 96.1 06/01/2020   PLT 288 06/01/2020   Lab Results  Component Value Date   NA 140 12/12/2019   K 4.9 12/12/2019   CO2 25 12/12/2019   GLUCOSE 80 12/12/2019   BUN 18 12/12/2019   CREATININE 1.08 (H) 12/12/2019   BILITOT 0.2 12/12/2019   ALKPHOS 51 09/24/2015   AST 25 12/12/2019   ALT 31 (H) 12/12/2019   PROT 6.2 12/12/2019   ALBUMIN 3.9 09/24/2015   CALCIUM 9.6 12/12/2019   Lab Results  Component Value Date   CHOL 230 (H) 06/04/2019   Lab Results  Component Value Date   HDL 53 06/04/2019   Lab Results  Component Value Date   LDLCALC 141 (H) 06/04/2019   Lab Results  Component Value Date    TRIG 222 (H) 06/04/2019   Lab Results  Component Value Date   CHOLHDL 4.3 06/04/2019   Lab Results  Component Value Date   HGBA1C 5.3 01/01/2014      Assessment & Plan:   Problem List Items Addressed This Visit      Cardiovascular and Mediastinum   Migraine headache    We will go ahead and refill her Imitrex today.      Relevant Medications   SUMAtriptan (IMITREX) 100 MG tablet    Other Visit Diagnoses    Abnormal liver enzymes    -  Primary   Relevant Orders   COMPLETE METABOLIC PANEL WITH GFR   CBC with Differential/Platelet   Hypokalemia       Relevant Orders   COMPLETE METABOLIC PANEL WITH GFR   CBC with Differential/Platelet   Atypical chest pain       Relevant Orders   EKG 12-Lead   Ambulatory referral to Cardiology   Abnormal EKG       Relevant Orders   Ambulatory referral to Cardiology     Abnormal liver enzymes-plan to recheck those today.  Hypokalemia I suspect some of this was related to the vomiting but it also might have been the recent start of the furosemide she is actually stopped the furosemide since being home so we will recheck potassium today.  We did discuss that if she restarts it at some point then we will need to recheck her  potassium.  Gastroenteritis-resolved sounds like she is still recovering just encouraged her to really continue to work on pushing fluids.  We also discussed stopping her metformin.  Atypical chest pain-EKG was noted to be abnormal during ED visit.  Again I was not sure if some of the hypokalemia could be contributing to the findings.  Abnormal EKG -EKG today shows normal sinus rhythm.  Possible old Q wave in lead III that was not there in 2011.  Plan to refer to cardiology for further work-up.  Meds ordered this encounter  Medications  . SUMAtriptan (IMITREX) 100 MG tablet    Sig: TAKE 1 TABLET BY MOUTH EVERY 2 HOURS AS NEEDED MIGRAINES AS DIRECTED    Dispense:  10 tablet    Refill:  11    Follow-up: Return if  symptoms worsen or fail to improve.   I spent 45 minutes on the day of the encounter to include pre-visit record review, face-to-face time with the patient and post visit ordering of test.   Nani Gasser, MD

## 2020-07-19 NOTE — Assessment & Plan Note (Signed)
We will go ahead and refill her Imitrex today.

## 2020-07-20 ENCOUNTER — Inpatient Hospital Stay: Payer: BC Managed Care – PPO | Admitting: Family Medicine

## 2020-07-20 NOTE — Telephone Encounter (Signed)
Patient advised.

## 2020-07-21 LAB — ACUTE HEP PANEL AND HEP B SURFACE AB

## 2020-07-21 LAB — CBC WITH DIFFERENTIAL/PLATELET
Absolute Monocytes: 931 cells/uL (ref 200–950)
Basophils Absolute: 61 cells/uL (ref 0–200)
Basophils Relative: 0.7 %
Eosinophils Absolute: 131 cells/uL (ref 15–500)
Eosinophils Relative: 1.5 %
HCT: 44.2 % (ref 35.0–45.0)
Hemoglobin: 14.6 g/dL (ref 11.7–15.5)
Lymphs Abs: 3558 cells/uL (ref 850–3900)
MCH: 31.5 pg (ref 27.0–33.0)
MCHC: 33 g/dL (ref 32.0–36.0)
MCV: 95.5 fL (ref 80.0–100.0)
MPV: 10.2 fL (ref 7.5–12.5)
Monocytes Relative: 10.7 %
Neutro Abs: 4019 cells/uL (ref 1500–7800)
Neutrophils Relative %: 46.2 %
Platelets: 365 10*3/uL (ref 140–400)
RBC: 4.63 10*6/uL (ref 3.80–5.10)
RDW: 13.7 % (ref 11.0–15.0)
Total Lymphocyte: 40.9 %
WBC: 8.7 10*3/uL (ref 3.8–10.8)

## 2020-07-21 LAB — COMPLETE METABOLIC PANEL WITH GFR
AG Ratio: 1.7 (calc) (ref 1.0–2.5)
ALT: 65 U/L — ABNORMAL HIGH (ref 6–29)
AST: 51 U/L — ABNORMAL HIGH (ref 10–35)
Albumin: 3.9 g/dL (ref 3.6–5.1)
Alkaline phosphatase (APISO): 77 U/L (ref 37–153)
BUN: 14 mg/dL (ref 7–25)
CO2: 25 mmol/L (ref 20–32)
Calcium: 10.2 mg/dL (ref 8.6–10.4)
Chloride: 107 mmol/L (ref 98–110)
Creat: 0.93 mg/dL (ref 0.50–1.05)
GFR, Est African American: 81 mL/min/{1.73_m2} (ref >=60)
GFR, Est Non African American: 70 mL/min/{1.73_m2} (ref >=60)
Globulin: 2.3 g/dL (calc) (ref 1.9–3.7)
Glucose, Bld: 72 mg/dL (ref 65–99)
Potassium: 4 mmol/L (ref 3.5–5.3)
Sodium: 142 mmol/L (ref 135–146)
Total Bilirubin: 0.2 mg/dL (ref 0.2–1.2)
Total Protein: 6.2 g/dL (ref 6.1–8.1)

## 2020-07-22 ENCOUNTER — Other Ambulatory Visit: Payer: Self-pay | Admitting: *Deleted

## 2020-07-22 DIAGNOSIS — R748 Abnormal levels of other serum enzymes: Secondary | ICD-10-CM

## 2020-07-25 ENCOUNTER — Other Ambulatory Visit: Payer: Self-pay | Admitting: Family Medicine

## 2020-07-26 LAB — HEPATITIS PANEL, ACUTE
Hep A IgM: NONREACTIVE
Hep B C IgM: NONREACTIVE
Hepatitis B Surface Ag: NONREACTIVE
Hepatitis C Ab: NONREACTIVE
SIGNAL TO CUT-OFF: 0 (ref <1.00)

## 2020-07-27 ENCOUNTER — Other Ambulatory Visit: Payer: Self-pay

## 2020-07-27 ENCOUNTER — Ambulatory Visit (HOSPITAL_COMMUNITY)
Admission: RE | Admit: 2020-07-27 | Discharge: 2020-07-27 | Disposition: A | Discharge status: Home / Self Care | Payer: BC Managed Care – PPO | Source: Ambulatory Visit | Attending: Family Medicine | Admitting: Family Medicine

## 2020-07-27 DIAGNOSIS — R748 Abnormal levels of other serum enzymes: Secondary | ICD-10-CM | POA: Diagnosis not present

## 2020-07-28 DIAGNOSIS — I10 Essential (primary) hypertension: Secondary | ICD-10-CM | POA: Insufficient documentation

## 2020-07-28 DIAGNOSIS — R5383 Other fatigue: Secondary | ICD-10-CM | POA: Insufficient documentation

## 2020-07-28 DIAGNOSIS — R5382 Chronic fatigue, unspecified: Secondary | ICD-10-CM | POA: Insufficient documentation

## 2020-07-28 DIAGNOSIS — G9332 Myalgic encephalomyelitis/chronic fatigue syndrome: Secondary | ICD-10-CM | POA: Insufficient documentation

## 2020-07-28 DIAGNOSIS — N289 Disorder of kidney and ureter, unspecified: Secondary | ICD-10-CM | POA: Insufficient documentation

## 2020-07-28 DIAGNOSIS — G43909 Migraine, unspecified, not intractable, without status migrainosus: Secondary | ICD-10-CM | POA: Insufficient documentation

## 2020-07-30 ENCOUNTER — Encounter: Payer: Self-pay | Admitting: Family Medicine

## 2020-08-02 ENCOUNTER — Ambulatory Visit: Payer: BC Managed Care – PPO | Admitting: Family Medicine

## 2020-08-05 NOTE — Progress Notes (Signed)
Cardiology Office Note:    Date:  08/06/2020   ID:  Carol Diaz, DOB 1965-12-09, MRN [id number]  PCP:  Agapito Games, MD  Cardiologist:  Norman Herrlich, MD   Referring MD: Agapito Games, *  ASSESSMENT:    1. Primary hypertension   2. Chest pain, unspecified type   3. Mixed hyperlipidemia    PLAN:    In order of problems listed above:  1. She is having exertional chest pain suggestive of angina intermediate risk of CAD.  We discussed further evaluation in view of the worldwide shortage of contrast dye we will plan on a pharmacologic perfusion study because of limitations in activity and a coronary calcium score for further evaluation.  If high risk markers would require coronary angiography and if her calcium score is elevated consider lipid-lowering treatment.  Next appointment 6 to 8 weeks    Medication Adjustments/Labs and Tests Ordered: Current medicines are reviewed at length with the patient today.  Concerns regarding medicines are outlined above.  Orders Placed This Encounter  Procedures  . CT CARDIAC SCORING (SELF PAY ONLY)  . MYOCARDIAL PERFUSION IMAGING   No orders of the defined types were placed in this encounter.    Chief Complaint  Patient presents with  . Chest Pain    History of Present Illness:    Carol Diaz is a 55 y.o. female who is being seen today for the evaluation of chest pain at the request of Agapito Games, *.  She was seen [facility] ED 07/17/2020 for complaints of fatigue with preceding GI symptoms.  During the evaluation she complained of fleeting left-sided chest pain the day prior that occurred at rest it was not anginal in nature.  In the ED she was found to have hypokalemia and was given oral supplement she has diagnosed with generalized fatigue and hypokalemia and discharged from the emergency room.  2 high-sensitivity troponins were normal without evidence of acute coronary syndrome.   TnT-Gen5 (1hr) 12  <14 ng/L   Echo 07/21/2019: 1. Left ventricular ejection fraction, by estimation, is 60 to 65%. The  left ventricle has normal function. The left ventricle has no regional  wall motion abnormalities. Left ventricular diastolic parameters were  normal.   EKG 07/19/2020 [facility] normal EKG  CXR 06/24/2019: IMPRESSION: Stable chest.  No active cardiopulmonary process.  Lower extremity ABI performed July 2020 was normal bilaterally with diminished TBI bilaterally.  Her husband administers present participates in the evaluation decision making. She recently was seen in the emergency room with marked weakness and fatigue attributed to hypokalemia. She has taken a beta-blocker long-term for hypertension and tremor. She relates she had a stress test done more than a decade ago that was normal. For the last year to year and a half when she does activities like climbing stairs at her daughter's home or walking a long distance she gets tightness substernal radiating through the back relieved in 5 to 10 minutes with rest.  Pattern is stable but is more frequent than it was in the past not nocturnal not at rest.  She also gets associated shortness of breath its not pleuritic in nature no diaphoresis nausea or vomiting. She has no known history of murmur congenital or rheumatic heart disease  Past Medical History:  Diagnosis Date  . Chronic fatigue syndrome   . Hypertension   . Migraines   . Palpitations   . Renal insufficiency   . Sjogren's disease (HCC) 03/17/2020  Past Surgical History:  Procedure Laterality Date  . CESAREAN SECTION    . ivp    . LAPAROSCOPIC APPENDECTOMY N/A 05/17/2017   Procedure: DIAGNOSTIC LAPAROSCOPY APPENDECTOMY;  Surgeon: Romie Levee, MD;  Location: [facility];  Service: General;  Laterality: N/A;  . TUBAL LIGATION      Current Medications: Current Meds  Medication Sig  . cetirizine (ZYRTEC) 10 MG tablet TK 1 T PO Q 24 H  . cyanocobalamin (,VITAMIN B-12,) 1000  MCG/ML injection every 30 (thirty) days.  . cyclobenzaprine (FLEXERIL) 10 MG tablet TAKE 1/2 TO 1 TABLET BY MOUTH THREE TIMES DAILY AS NEEDED FOR MUSCLE SPASMS (Patient taking differently: Take 10 mg by mouth at bedtime.)  . DULoxetine (CYMBALTA) 60 MG capsule TAKE 1 CAPSULE(60 MG) BY MOUTH DAILY  . fluticasone (FLONASE) 50 MCG/ACT nasal spray One spray in each nostril twice a day, use left hand for right nostril, and right hand for left nostril.  . folic acid (FOLVITE) 1 MG tablet Take 1 mg by mouth daily.  . hydroxychloroquine (PLAQUENIL) 200 MG tablet Take 200 mg by mouth 2 (two) times daily.  Marland Kitchen LINZESS 290 MCG CAPS capsule TAKE ONE CAPSULE BY MOUTH DAILY, 30 MINUTES BEFORE FIRST MEAL OF THE DAY  . omeprazole (PRILOSEC) 40 MG capsule Take 1 capsule by mouth daily.  . ondansetron (ZOFRAN-ODT) 4 MG disintegrating tablet DISSOLVE 1 TABLET(4 MG) ON THE TONGUE EVERY 8 HOURS AS NEEDED FOR NAUSEA OR VOMITING  . propranolol ER (INDERAL LA) 60 MG 24 hr capsule TAKE 1 CAPSULE(60 MG) BY MOUTH DAILY  . SUMAtriptan (IMITREX) 100 MG tablet TAKE 1 TABLET BY MOUTH EVERY 2 HOURS AS NEEDED MIGRAINES AS DIRECTED  . Thiamine HCl (B-1) 100 MG TABS Take 1 tablet by mouth 2 (two) times daily.  . traMADol (ULTRAM) 50 MG tablet Take 50 mg by mouth 3 (three) times daily as needed for moderate pain.  . Vitamin D, Ergocalciferol, (DRISDOL) 1.25 MG (50000 UNIT) CAPS capsule Take 50,000 Units by mouth once a week.     Allergies:   Fluoxetine, Lexapro [escitalopram oxalate], Morphine, and Sertraline   Social History   Socioeconomic History  . Marital status: Married    Spouse name: Not on file  . Number of children: 2  . Years of education: Not on file  . Highest education level: Not on file  Occupational History    Employer: [employer]  Tobacco Use  . Smoking status: Never Smoker  . Smokeless tobacco: Never Used  Substance and Sexual Activity  . Alcohol use: No  . Drug use: No  . Sexual activity:  Yes    Partners: Male    Birth control/protection: Pill  Other Topics Concern  . Not on file  Social History Narrative   Some exercise.    Social Determinants of Health   Financial Resource Strain: Not on file  Food Insecurity: Not on file  Transportation Needs: Not on file  Physical Activity: Not on file  Stress: Not on file  Social Connections: Not on file     Family History: The patient's family history includes Emphysema in her mother; Heart disease (age of onset: 82) in her brother; Hypertension in her father.  ROS:   ROS Please see the history of present illness.     All other systems reviewed and are negative.  EKGs/Labs/Other Studies Reviewed:    The following studies were reviewed today:   I reviewed her office EKG independently sinus rhythm my opinion was normal  Recent Labs: 12/12/2019: Magnesium 2.0 07/19/2020: Diaz 65; BUN 14; Creat 0.93; Hemoglobin 14.6; Platelets 365; Potassium 4.0; Sodium 142  Recent Lipid Panel    Component Value Date/Time   CHOL 230 (H) 06/04/2019 1203   TRIG 222 (H) 06/04/2019 1203   HDL 53 06/04/2019 1203   CHOLHDL 4.3 06/04/2019 1203   VLDL 35 (H) 07/17/2016 0847   LDLCALC 141 (H) 06/04/2019 1203    Physical Exam:    VS:  BP 107/71 (BP Location: Left Arm, Patient Position: Sitting, Cuff Size: Normal)   Pulse 88   Ht 5' 5.5" (1.664 m)   Wt 150 lb (68 kg)   LMP 04/22/2018 (Exact Date)   SpO2 99%   BMI 24.58 kg/m     Wt Readings from Last 3 Encounters:  08/06/20 150 lb (68 kg)  07/19/20 150 lb (68 kg)  06/01/20 158 lb (71.7 kg)     GEN:  Well nourished, well developed in no acute distress HEENT: Normal NECK: No JVD; No carotid bruits LYMPHATICS: No lymphadenopathy CARDIAC: RRR, no murmurs, rubs, gallops RESPIRATORY:  Clear to auscultation without rales, wheezing or rhonchi  ABDOMEN: Soft, non-tender, non-distended MUSCULOSKELETAL:  No edema; No deformity  SKIN: Warm and dry NEUROLOGIC:  Alert and oriented x  3 PSYCHIATRIC:  Normal affect     Signed, Norman Herrlich, MD  08/06/2020 4:04 PM    [facility]

## 2020-08-06 ENCOUNTER — Ambulatory Visit: Payer: BC Managed Care – PPO | Admitting: Cardiology

## 2020-08-06 ENCOUNTER — Other Ambulatory Visit: Payer: Self-pay

## 2020-08-06 ENCOUNTER — Encounter: Payer: Self-pay | Admitting: Cardiology

## 2020-08-06 VITALS — BP 107/71 | HR 88 | Ht 65.5 in | Wt 150.0 lb

## 2020-08-06 DIAGNOSIS — R079 Chest pain, unspecified: Secondary | ICD-10-CM

## 2020-08-06 DIAGNOSIS — E782 Mixed hyperlipidemia: Secondary | ICD-10-CM

## 2020-08-06 DIAGNOSIS — I10 Essential (primary) hypertension: Secondary | ICD-10-CM | POA: Diagnosis not present

## 2020-08-06 NOTE — Patient Instructions (Signed)
Medication Instructions:  Your physician recommends that you continue on your current medications as directed. Please refer to the Current Medication list given to you today.  *If you need a refill on your cardiac medications before your next appointment, please call your pharmacy*   Lab Work: None If you have labs (blood work) drawn today and your tests are completely normal, you will receive your results only by: Marland Kitchen MyChart Message (if you have MyChart) OR . A paper copy in the mail If you have any lab test that is abnormal or we need to change your treatment, we will call you to review the results.   Testing/Procedures:   [facility] at [facility] [address] [place], [place] [address] Phone: [phone]    Please arrive 15 minutes prior to your appointment time for registration and insurance purposes.  The test will take approximately 3 to 4 hours to complete; you may bring reading material.  If someone comes with you to your appointment, they will need to remain in the main lobby due to limited space in the testing area. **If you are pregnant or breastfeeding, please notify the nuclear lab prior to your appointment**  How to prepare for your Myocardial Perfusion Test: . Do not eat or drink 3 hours prior to your test, except you may have water. . Do not consume products containing caffeine (regular or decaffeinated) 12 hours prior to your test. (ex: coffee, chocolate, sodas, tea). . Do bring a list of your current medications with you.  If not listed below, you may take your medications as normal. . Do wear comfortable clothes (no dresses or overalls) and walking shoes, tennis shoes preferred (No heels or open toe shoes are allowed). . Do NOT wear cologne, perfume, aftershave, or lotions (deodorant is allowed). . If these instructions are not followed, your test will have to be rescheduled.  Please report to [address]  [address] for your test.  If you have questions or concerns about your appointment, you can call the Nuclear Lab at [phone].  If you cannot keep your appointment, please provide 24 hours notification to the Nuclear Lab, to avoid a possible $50 charge to your account.  We have put in the order for you to have a CT calcium score. They will call you to schedule this appointment.    Follow-Up: At [facility], you and your health needs are our priority.  As part of our continuing mission to provide you with exceptional heart care, we have created designated Provider Care Teams.  These Care Teams include your primary Cardiologist (physician) and Advanced Practice Providers (APPs -  Physician Assistants and Nurse Practitioners) who all work together to provide you with the care you need, when you need it.  We recommend signing up for the patient portal called "MyChart".  Sign up information is provided on this After Visit Summary.  MyChart is used to connect with patients for Virtual Visits (Telemedicine).  Patients are able to view lab/test results, encounter notes, upcoming appointments, etc.  Non-urgent messages can be sent to your provider as well.   To learn more about what you can do with MyChart, go to [email or web address].    Your next appointment:   8 week(s)  The format for your next appointment:   In Person  Provider:   Norman Herrlich, MD   Other Instructions

## 2020-08-12 ENCOUNTER — Telehealth (HOSPITAL_COMMUNITY): Payer: Self-pay | Admitting: *Deleted

## 2020-08-12 NOTE — Telephone Encounter (Signed)
Left message on voicemail per DPR in reference to upcoming appointment scheduled on 08/18/20 with detailed instructions given per Myocardial Perfusion Study Information Sheet for the test. LM to arrive 15 minutes early, and that it is imperative to arrive on time for appointment to keep from having the test rescheduled. If you need to cancel or reschedule your appointment, please call the office within 24 hours of your appointment. Failure to do so may result in a cancellation of your appointment, and a $50 no show fee. Phone number given for call back for any questions. Keldan Eplin Jacqueline    

## 2020-08-17 NOTE — Addendum Note (Signed)
Addended byNorman Herrlich on: 08/17/2020 07:58 AM   Modules accepted: Orders

## 2020-08-18 ENCOUNTER — Ambulatory Visit (HOSPITAL_COMMUNITY): Payer: BC Managed Care – PPO | Attending: Cardiovascular Disease

## 2020-08-18 ENCOUNTER — Ambulatory Visit (INDEPENDENT_AMBULATORY_CARE_PROVIDER_SITE_OTHER)
Admission: RE | Admit: 2020-08-18 | Discharge: 2020-08-18 | Disposition: A | Discharge status: Home / Self Care | Payer: Self-pay | Source: Ambulatory Visit | Attending: Cardiology | Admitting: Cardiology

## 2020-08-18 ENCOUNTER — Other Ambulatory Visit: Payer: Self-pay

## 2020-08-18 ENCOUNTER — Telehealth: Payer: Self-pay

## 2020-08-18 DIAGNOSIS — I10 Essential (primary) hypertension: Secondary | ICD-10-CM

## 2020-08-18 DIAGNOSIS — R079 Chest pain, unspecified: Secondary | ICD-10-CM | POA: Insufficient documentation

## 2020-08-18 LAB — MYOCARDIAL PERFUSION IMAGING
LV dias vol: 42 mL (ref 46–106)
LV sys vol: 19 mL
Peak HR: 120 {beats}/min
Rest HR: 69 {beats}/min
SDS: 0
SRS: 0
SSS: 0
TID: 0.8

## 2020-08-18 MED ORDER — AMINOPHYLLINE 25 MG/ML IV SOLN
75.0000 mg | Freq: Once | INTRAVENOUS | Status: AC
  Administered 2020-08-18: 75 mg via INTRAVENOUS

## 2020-08-18 MED ORDER — TECHNETIUM TC 99M TETROFOSMIN IV KIT
32.1000 | PACK | Freq: Once | INTRAVENOUS | Status: AC | PRN
  Administered 2020-08-18: 32.1 via INTRAVENOUS
  Filled 2020-08-18: qty 33

## 2020-08-18 MED ORDER — TECHNETIUM TC 99M TETROFOSMIN IV KIT
10.1000 | PACK | Freq: Once | INTRAVENOUS | Status: AC | PRN
Start: 2020-08-18 — End: 2020-08-18
  Administered 2020-08-18: 10.1 via INTRAVENOUS
  Filled 2020-08-18: qty 11

## 2020-08-18 MED ORDER — REGADENOSON 0.4 MG/5ML IV SOLN
0.4000 mg | Freq: Once | INTRAVENOUS | Status: AC
  Administered 2020-08-18: 0.4 mg via INTRAVENOUS

## 2020-08-18 NOTE — Telephone Encounter (Signed)
-----   Message from Baldo Daub, MD sent at 08/18/2020  2:11 PM EDT ----- Randie Heinz result CT images are normal Her calcium score is 0 no coronary atherosclerosis for cardiac events. This places her at very low risk for cardiac events over the next 5 to 7 years

## 2020-08-18 NOTE — Telephone Encounter (Signed)
Spoke with patient regarding results and recommendation.  Patient verbalizes understanding and is agreeable to plan of care. Advised patient to call back with any issues or concerns.  

## 2020-08-19 ENCOUNTER — Telehealth: Payer: Self-pay

## 2020-08-19 ENCOUNTER — Encounter: Payer: Self-pay | Admitting: Family Medicine

## 2020-08-19 DIAGNOSIS — K769 Liver disease, unspecified: Secondary | ICD-10-CM

## 2020-08-19 NOTE — Telephone Encounter (Signed)
Spoke with patient regarding results and recommendation.  Patient verbalizes understanding and is agreeable to plan of care. Advised patient to call back with any issues or concerns.  

## 2020-08-19 NOTE — Telephone Encounter (Signed)
-----   Message from Baldo Daub, MD sent at 08/18/2020  5:56 PM EDT ----- Her myocardial perfusion test is normal Combined with a calcium score of 0 places her at very low risk of obstructive CAD and cardiac events next 5-7 years She could follow-up with her primary care physician if she chooses I not think she requires any further cardiac testing at this time

## 2020-08-24 ENCOUNTER — Encounter: Payer: Self-pay | Admitting: Family Medicine

## 2020-08-24 DIAGNOSIS — B370 Candidal stomatitis: Secondary | ICD-10-CM

## 2020-08-24 MED ORDER — MAGIC MOUTHWASH W/LIDOCAINE: 10.0000 mL | Freq: Four times a day (QID) | ORAL | 3 refills | Status: DC

## 2020-08-24 NOTE — Telephone Encounter (Signed)
Printed. pls fax it would not let me E scribe.

## 2020-08-26 ENCOUNTER — Encounter: Payer: Self-pay | Admitting: Family Medicine

## 2020-08-26 ENCOUNTER — Telehealth (INDEPENDENT_AMBULATORY_CARE_PROVIDER_SITE_OTHER): Payer: BC Managed Care – PPO | Admitting: Family Medicine

## 2020-08-26 DIAGNOSIS — J019 Acute sinusitis, unspecified: Secondary | ICD-10-CM | POA: Diagnosis not present

## 2020-08-26 DIAGNOSIS — J329 Chronic sinusitis, unspecified: Secondary | ICD-10-CM | POA: Diagnosis not present

## 2020-08-26 MED ORDER — FLUCONAZOLE 150 MG PO TABS: 150.0000 mg | ORAL_TABLET | Freq: Every day | ORAL | 1 refills | Status: DC

## 2020-08-26 MED ORDER — AMOXICILLIN-POT CLAVULANATE 875-125 MG PO TABS: 1.0000 | ORAL_TABLET | Freq: Two times a day (BID) | ORAL | 0 refills | Status: DC

## 2020-08-26 NOTE — Progress Notes (Signed)
sxs began over the weekend. Over the past 3 nights she has had a lot of head pressure. She stated that her head feels full and the mucus is thick. She said the mucus is bloody and green.she denies any f/s/c/n/v/d. She has only been taking tylenol for the pain and pressure.   Hasn't taken a covid test.   She is currently at work with [employer] testing

## 2020-08-26 NOTE — Progress Notes (Signed)
Virtual Visit via Video Note  I connected with Carol Diaz on 08/26/20 at 11:30 AM EDT by a video enabled telemedicine application and verified that I am speaking with the correct person using two identifiers.   I discussed the limitations of evaluation and management by telemedicine and the availability of in person appointments. The patient expressed understanding and agreed to proceed.  Patient location: at work Provider location: in office  Subjective:    CC: sinus sxs  HPI: sxs began over the weekend. Over the past 3 nights she has had a lot of head pressure. She stated that her head feels full and the mucus is thick. She said the mucus is bloody and green.she denies any f/s/c/n/v/d. She has only been taking tylenol for the pain and pressure.  Using her Flonase.  No GI sxs.  No ST.  Facial pressure is worse with bending well.   Hasn't taken a covid test.   She is currently at work with [employer] testing   Past medical history, Surgical history, Family history not pertinant except as noted below, Social history, Allergies, and medications have been entered into the medical record, reviewed, and corrections made.    Objective:    General: Speaking clearly in complete sentences without any shortness of breath.  Alert and oriented x3.  Normal judgment. No apparent acute distress.    Impression and Recommendations:    No problem-specific Assessment & Plan notes found for this encounter.  Acute sinusitis - strongly recommended she get tested for COVID.  recommend symptomatic care. Start Augmentin.  Call if not better.  She has had multiple sinus infections in the last several years. Rec refer to ENT.  Pt agrees.     Orders Placed This Encounter  Procedures  . Ambulatory referral to ENT    Referral Priority:   Routine    Referral Type:   Consultation    Referral Reason:   Specialty Services Required    Requested Specialty:   Otolaryngology    Number of Visits Requested:   1     Meds ordered this encounter  Medications  . amoxicillin-clavulanate (AUGMENTIN) 875-125 MG tablet    Sig: Take 1 tablet by mouth 2 (two) times daily.    Dispense:  14 tablet    Refill:  0  . fluconazole (DIFLUCAN) 150 MG tablet    Sig: Take 1 tablet (150 mg total) by mouth daily.    Dispense:  1 tablet    Refill:  1     I discussed the assessment and treatment plan with the patient. The patient was provided an opportunity to ask questions and all were answered. The patient agreed with the plan and demonstrated an understanding of the instructions.   The patient was advised to call back or seek an in-person evaluation if the symptoms worsen or if the condition fails to improve as anticipated.   Nani Gasser, MD  i

## 2020-09-06 ENCOUNTER — Ambulatory Visit (INDEPENDENT_AMBULATORY_CARE_PROVIDER_SITE_OTHER): Payer: BC Managed Care – PPO

## 2020-09-06 ENCOUNTER — Other Ambulatory Visit: Payer: Self-pay | Admitting: Family Medicine

## 2020-09-06 ENCOUNTER — Other Ambulatory Visit: Payer: Self-pay

## 2020-09-06 DIAGNOSIS — R933 Abnormal findings on diagnostic imaging of other parts of digestive tract: Secondary | ICD-10-CM

## 2020-09-06 DIAGNOSIS — K769 Liver disease, unspecified: Secondary | ICD-10-CM

## 2020-09-06 DIAGNOSIS — M545 Low back pain, unspecified: Secondary | ICD-10-CM

## 2020-09-06 MED ORDER — GADOBUTROL 1 MMOL/ML IV SOLN
7.0000 mL | Freq: Once | INTRAVENOUS | Status: AC | PRN
  Administered 2020-09-06: 7.5 mL via INTRAVENOUS

## 2020-09-13 ENCOUNTER — Other Ambulatory Visit: Payer: Self-pay | Admitting: Family Medicine

## 2020-09-21 ENCOUNTER — Other Ambulatory Visit: Payer: Self-pay | Admitting: Family Medicine

## 2020-09-21 DIAGNOSIS — M542 Cervicalgia: Secondary | ICD-10-CM

## 2020-10-01 ENCOUNTER — Ambulatory Visit: Payer: BC Managed Care – PPO | Admitting: Cardiology

## 2020-10-11 ENCOUNTER — Encounter: Payer: Self-pay | Admitting: Family Medicine

## 2020-10-12 MED ORDER — LINACLOTIDE 290 MCG PO CAPS: 290.0000 ug | ORAL_CAPSULE | Freq: Every day | ORAL | 0 refills | Status: DC

## 2020-10-27 ENCOUNTER — Ambulatory Visit: Payer: BC Managed Care – PPO | Admitting: Family Medicine

## 2020-10-28 ENCOUNTER — Other Ambulatory Visit: Payer: Self-pay

## 2020-10-28 ENCOUNTER — Ambulatory Visit (INDEPENDENT_AMBULATORY_CARE_PROVIDER_SITE_OTHER): Payer: BC Managed Care – PPO

## 2020-10-28 ENCOUNTER — Ambulatory Visit: Payer: BC Managed Care – PPO | Admitting: Medical-Surgical

## 2020-10-28 ENCOUNTER — Encounter: Payer: Self-pay | Admitting: Medical-Surgical

## 2020-10-28 VITALS — BP 109/75 | HR 88 | Resp 20 | Wt 156.3 lb

## 2020-10-28 DIAGNOSIS — M79661 Pain in right lower leg: Secondary | ICD-10-CM

## 2020-10-28 DIAGNOSIS — M79604 Pain in right leg: Secondary | ICD-10-CM | POA: Diagnosis not present

## 2020-10-28 DIAGNOSIS — W19XXXA Unspecified fall, initial encounter: Secondary | ICD-10-CM

## 2020-10-28 NOTE — Progress Notes (Signed)
  HPI with pertinent ROS:   CC: Fall  HPI: Pleasant 55 year old female presenting today after having a fall over the past weekend.  She went to the beach for [place] days and on their first night there, she had a fall in the hotel room.  Notes that there was a stepdown in the room and she lost her balance.  When she went down, she noted that she had her right shin just below the knee and since then she has had right lower extremity pain from the area of impact going down to just above the ankle on the anterior part of the leg.  Notes that her shin is very swollen and tender to touch.  Does have sharp stabbing pains when she is walking but is able to bear weight without difficulty.  Cannot stand anything to touch the skin over her right lower leg.  Notes that it does swell quite a bit by the end of the day when she is on her feet.  Elevation does help.  She went home yesterday evening and elevated her leg and the swelling is resolved this morning.  She did take Advil as well as Tylenol which provided a little bit of relief.  She has tramadol that she uses for chronic pains and she took 1 last night which did help her sleep.  She has not tried any topical or other conservative measures at this point.  I reviewed the past medical history, family history, social history, surgical history, and allergies today and no changes were needed.  Please see the problem list section below in epic for further details.   Physical exam:   General: Well Developed, well nourished, and in no acute distress.  Neuro: Alert and oriented x3.  HEENT: Normocephalic, atraumatic.  Skin: Warm and dry. Cardiac: Regular rate and rhythm, no murmurs rubs or gallops, no lower extremity edema.  Respiratory: Clear to auscultation bilaterally. Not using accessory muscles, speaking in full sentences. Right leg: Full range of motion to the hip, knee, and ankle.  Swelling and redness noted over the anterior shin with a small 2 cm x 1 cm  abrasion just below the knee.  Erythema surrounding the abrasion but no signs of purulent drainage to indicate infection.  Tenderness to touch along the anterior tibia.  Impression and Recommendations:    1. Fall, initial encounter Getting x-rays of her right tibia/fibula today.  Able to bear weight and walk with only a slight modification of gait.  Use Tylenol/ibuprofen as needed for pain.  Continue chronic tramadol use as needed.  Consider lidocaine patches topically to help with skin tenderness.  Xeroform to the abrasion on her knee, covered with a Band-Aid.  Advised to monitor for signs and symptoms of infection.  Reviewed expectations for soft tissue injury.  Continue elevating when possible.  If tenderness resolves, consider using compression to prevent swelling.  Return if symptoms worsen or fail to improve. ___________________________________________ Thayer Ohm, DNP, APRN, FNP-BC Primary Care and Sports Medicine [facility] [place]

## 2020-10-29 ENCOUNTER — Encounter: Payer: Self-pay | Admitting: Medical-Surgical

## 2020-10-29 ENCOUNTER — Ambulatory Visit: Payer: BC Managed Care – PPO | Admitting: Medical-Surgical

## 2020-10-30 ENCOUNTER — Other Ambulatory Visit: Payer: Self-pay | Admitting: Family Medicine

## 2020-11-02 ENCOUNTER — Telehealth: Payer: Self-pay

## 2020-11-02 NOTE — Telephone Encounter (Signed)
Please call pt to schedule appt.  No further refills until pt is seen.  T. Chou Busler, CMA  

## 2020-11-03 ENCOUNTER — Other Ambulatory Visit: Payer: Self-pay | Admitting: *Deleted

## 2020-11-03 NOTE — Telephone Encounter (Signed)
PT scheduled for Sept 1st. No sooner appt's open

## 2020-11-13 ENCOUNTER — Other Ambulatory Visit: Payer: Self-pay | Admitting: Family Medicine

## 2020-11-15 ENCOUNTER — Telehealth: Payer: Self-pay

## 2020-11-15 ENCOUNTER — Other Ambulatory Visit: Payer: Self-pay | Admitting: Family Medicine

## 2020-11-15 DIAGNOSIS — R531 Weakness: Secondary | ICD-10-CM

## 2020-11-15 DIAGNOSIS — H538 Other visual disturbances: Secondary | ICD-10-CM

## 2020-11-15 DIAGNOSIS — M545 Low back pain, unspecified: Secondary | ICD-10-CM

## 2020-11-15 DIAGNOSIS — M2550 Pain in unspecified joint: Secondary | ICD-10-CM

## 2020-11-15 NOTE — Telephone Encounter (Signed)
Pts daughter Dorathy Daft (on [place] form) called requesting a referral to the [facility] [address] in [place]. She states that they are no longer satisfied with [facility], that they do not feel that the provider is listening to them or truly trying to treat her. They were told about the [facility] in [place] and would like a referral to that office. Referral order has been tee'd up below and ready for review and approval/denial.

## 2020-11-15 NOTE — Telephone Encounter (Signed)
Orders Placed This Encounter  ?Procedures  ? Ambulatory referral to Rheumatology  ?  Referral Priority:   Routine  ?  Referral Type:   Consultation  ?  Referral Reason:   Specialty Services Required  ?  Requested Specialty:   Rheumatology  ?  Number of Visits Requested:   1  ? ? ?

## 2020-11-17 NOTE — Telephone Encounter (Signed)
Pt aware referral was entered on 11/15/20 and states that [facility] has already called her and she is scheduled to see Sheliah Hatch on 12/22/20. No further questions or concerns at this time.

## 2020-11-22 ENCOUNTER — Other Ambulatory Visit: Payer: Self-pay

## 2020-11-22 DIAGNOSIS — M545 Low back pain, unspecified: Secondary | ICD-10-CM

## 2020-11-22 MED ORDER — CYCLOBENZAPRINE HCL 10 MG PO TABS: 10.0000 mg | ORAL_TABLET | Freq: Three times a day (TID) | ORAL | 0 refills | Status: DC | PRN

## 2020-11-22 NOTE — Telephone Encounter (Signed)
Pt daughter called stating that pt has appt 11/26/18, however her mother needs a small refill on cyclobenzaprine to get her thru until her appt.  Refill for #5 sent to pharmacy.  Pt's daughter informed.  Tiajuana Amass, CMA

## 2020-11-25 ENCOUNTER — Ambulatory Visit: Payer: BC Managed Care – PPO | Admitting: Family Medicine

## 2020-11-25 ENCOUNTER — Encounter: Payer: Self-pay | Admitting: Family Medicine

## 2020-11-25 ENCOUNTER — Other Ambulatory Visit: Payer: Self-pay

## 2020-11-25 VITALS — BP 125/81 | HR 78 | Ht 66.0 in | Wt 151.0 lb

## 2020-11-25 DIAGNOSIS — I10 Essential (primary) hypertension: Secondary | ICD-10-CM

## 2020-11-25 DIAGNOSIS — K219 Gastro-esophageal reflux disease without esophagitis: Secondary | ICD-10-CM | POA: Insufficient documentation

## 2020-11-25 DIAGNOSIS — N182 Chronic kidney disease, stage 2 (mild): Secondary | ICD-10-CM | POA: Diagnosis not present

## 2020-11-25 DIAGNOSIS — Z23 Encounter for immunization: Secondary | ICD-10-CM

## 2020-11-25 DIAGNOSIS — Z1231 Encounter for screening mammogram for malignant neoplasm of breast: Secondary | ICD-10-CM

## 2020-11-25 DIAGNOSIS — G43009 Migraine without aura, not intractable, without status migrainosus: Secondary | ICD-10-CM

## 2020-11-25 DIAGNOSIS — E519 Thiamine deficiency, unspecified: Secondary | ICD-10-CM

## 2020-11-25 DIAGNOSIS — G250 Essential tremor: Secondary | ICD-10-CM

## 2020-11-25 DIAGNOSIS — M797 Fibromyalgia: Secondary | ICD-10-CM

## 2020-11-25 DIAGNOSIS — M545 Low back pain, unspecified: Secondary | ICD-10-CM

## 2020-11-25 DIAGNOSIS — K2100 Gastro-esophageal reflux disease with esophagitis, without bleeding: Secondary | ICD-10-CM

## 2020-11-25 DIAGNOSIS — M542 Cervicalgia: Secondary | ICD-10-CM

## 2020-11-25 DIAGNOSIS — E782 Mixed hyperlipidemia: Secondary | ICD-10-CM

## 2020-11-25 MED ORDER — PROPRANOLOL HCL ER 60 MG PO CP24: 60.0000 mg | ORAL_CAPSULE | Freq: Every day | ORAL | 1 refills | Status: DC

## 2020-11-25 MED ORDER — CYCLOBENZAPRINE HCL 10 MG PO TABS: 10.0000 mg | ORAL_TABLET | Freq: Every evening | ORAL | 1 refills | Status: DC | PRN

## 2020-11-25 MED ORDER — METFORMIN HCL 500 MG PO TABS
500.0000 mg | ORAL_TABLET | Freq: Two times a day (BID) | ORAL | 0 refills | Status: DC
Start: 2020-11-25 — End: 2020-11-25

## 2020-11-25 MED ORDER — ONDANSETRON 4 MG PO TBDP: ORAL_TABLET | ORAL | 0 refills | Status: DC

## 2020-11-25 MED ORDER — LINACLOTIDE 290 MCG PO CAPS
290.0000 ug | ORAL_CAPSULE | Freq: Every day | ORAL | 3 refills | Status: DC
Start: 2020-11-25 — End: 2021-01-12

## 2020-11-25 MED ORDER — DULOXETINE HCL 60 MG PO CPEP: ORAL_CAPSULE | ORAL | 1 refills | Status: DC

## 2020-11-25 NOTE — Assessment & Plan Note (Signed)
Well controlled. Continue current regimen. Follow up in  6 mo  

## 2020-11-25 NOTE — Assessment & Plan Note (Signed)
Well controlled. Consider decreasing topamax. Will discuss again at f/u in 6 mo

## 2020-11-25 NOTE — Assessment & Plan Note (Signed)
Due to recheck renal function. 

## 2020-11-25 NOTE — Progress Notes (Signed)
Established Patient Office Visit  Subjective:  Patient ID: Carol Diaz, female    DOB: 01-05-1966  Age: 55 y.o. MRN: [id number]  CC:  Chief Complaint  Patient presents with   Follow-up    HPI Carol Diaz presents for   F/U tremor - she restarted her primidone.  Hypertension- Pt denies chest pain, SOB, dizziness, or heart palpitations.  Taking meds as directed w/o problems.  Denies medication side effects.   She is still taking her vitamin B1 and vitamin B12 supplements.  She has not had labs checked in a while.  Follow-up migraine headaches-overall she is actually doing really well she says in fact she has not had a full-blown migraine in quite some time she has been getting just an occasional mild headache.  Has been noticing a lot of bruising on her forearms.  Has been diagnosed with Sjogren's and polymyalgia rheumatica by rheumatology.  She actually has a new consult next month.  She is still on hydroxychloroquine and has been taking it regularly.  Past Medical History:  Diagnosis Date   Chronic fatigue syndrome    Hypertension    Migraines    Palpitations    Renal insufficiency    Sjogren's disease ([facility]) 03/17/2020    Past Surgical History:  Procedure Laterality Date   CESAREAN SECTION     ivp     LAPAROSCOPIC APPENDECTOMY N/A 05/17/2017   Procedure: DIAGNOSTIC LAPAROSCOPY APPENDECTOMY;  Surgeon: Romie Levee, MD;  Location: [facility];  Service: General;  Laterality: N/A;   TUBAL LIGATION      Family History  Problem Relation Age of Onset   Emphysema Mother    Hypertension Father    Heart disease Brother 67       mi    Social History   Socioeconomic History   Marital status: Married    Spouse name: Not on file   Number of children: 2   Years of education: Not on file   Highest education level: Not on file  Occupational History    Employer: [employer]  Tobacco Use   Smoking status: Never   Smokeless tobacco: Never  Substance and  Sexual Activity   Alcohol use: No   Drug use: No   Sexual activity: Yes    Partners: Male    Birth control/protection: Pill  Other Topics Concern   Not on file  Social History Narrative   Some exercise.    Social Determinants of Health   Financial Resource Strain: Not on file  Food Insecurity: Not on file  Transportation Needs: Not on file  Physical Activity: Not on file  Stress: Not on file  Social Connections: Not on file  Intimate Partner Violence: Not on file    Outpatient Medications Prior to Visit  Medication Sig Dispense Refill   cetirizine (ZYRTEC) 10 MG tablet TK 1 T PO Q 24 H     cyanocobalamin (,VITAMIN B-12,) 1000 MCG/ML injection every 30 (thirty) days.     fluticasone (FLONASE) 50 MCG/ACT nasal spray One spray in each nostril twice a day, use left hand for right nostril, and right hand for left nostril. 48 g 3   folic acid (FOLVITE) 1 MG tablet Take 1 mg by mouth daily.     hydrochlorothiazide (HYDRODIURIL) 12.5 MG tablet Take 12.5 mg by mouth every morning.     hydroxychloroquine (PLAQUENIL) 200 MG tablet Take 200 mg by mouth 2 (two) times daily.     omeprazole (PRILOSEC) 40 MG capsule  Take 1 capsule by mouth daily.     primidone (MYSOLINE) 50 MG tablet Take 100 mg by mouth 2 (two) times daily.     SUMAtriptan (IMITREX) 100 MG tablet TAKE 1 TABLET BY MOUTH EVERY 2 HOURS AS NEEDED MIGRAINES AS DIRECTED 10 tablet 11   Thiamine HCl (B-1) 100 MG TABS Take 1 tablet by mouth 2 (two) times daily.     topiramate (TOPAMAX) 50 MG tablet Take 50 mg by mouth 2 (two) times daily.     traMADol (ULTRAM) 50 MG tablet Take 50 mg by mouth 3 (three) times daily as needed for moderate pain.     Vitamin D, Ergocalciferol, (DRISDOL) 1.25 MG (50000 UNIT) CAPS capsule Take 50,000 Units by mouth once a week.     cyclobenzaprine (FLEXERIL) 10 MG tablet Take 1 tablet (10 mg total) by mouth 3 (three) times daily as needed for muscle spasms. 5 tablet 0   DULoxetine (CYMBALTA) 60 MG capsule  TAKE 1 CAPSULE(60 MG) BY MOUTH DAILY 90 capsule 0   linaclotide (LINZESS) 290 MCG CAPS capsule Take 1 capsule (290 mcg total) by mouth daily before breakfast. 90 capsule 0   metFORMIN (GLUCOPHAGE) 500 MG tablet Take 1 tablet (500 mg total) by mouth 2 (two) times daily with a meal. OFFICE VISIT REQUIRED PRIOR TO ANY FURTHER REFILLS 60 tablet 0   ondansetron (ZOFRAN-ODT) 4 MG disintegrating tablet DISSOLVE 1 TABLET(4 MG) ON THE TONGUE EVERY 8 HOURS AS NEEDED FOR NAUSEA OR VOMITING 20 tablet 0   propranolol ER (INDERAL LA) 60 MG 24 hr capsule Take 1 capsule (60 mg total) by mouth daily. 90 capsule 0   magic mouthwash w/lidocaine SOLN Take 10 mLs by mouth 4 (four) times daily. Diphenhydramine 12.5/5 mL, lidocaine viscus 2%, Maalox.  Mix 1:1:1 swish for 2 minutes then spit out. 360 mL 3   No facility-administered medications prior to visit.    Allergies  Allergen Reactions   Fluoxetine Other (See Comments)    Made her tremor worse    Lexapro [Escitalopram Oxalate] Other (See Comments)    Headache   Morphine Nausea And Vomiting   Sertraline Other (See Comments)    Headache     ROS Review of Systems    Objective:    Physical Exam Constitutional:      Appearance: Normal appearance. She is well-developed.  HENT:     Head: Normocephalic and atraumatic.  Cardiovascular:     Rate and Rhythm: Normal rate and regular rhythm.     Heart sounds: Normal heart sounds.  Pulmonary:     Effort: Pulmonary effort is normal.     Breath sounds: Normal breath sounds.  Skin:    General: Skin is warm and dry.  Neurological:     Mental Status: She is alert and oriented to person, place, and time.  Psychiatric:        Behavior: Behavior normal.    BP 125/81   Pulse 78   Ht 5\' 6"  (1.676 m)   Wt 151 lb (68.5 kg)   LMP 04/22/2018 (Exact Date)   SpO2 98%   BMI 24.37 kg/m  Wt Readings from Last 3 Encounters:  11/25/20 151 lb (68.5 kg)  10/28/20 156 lb 4.8 oz (70.9 kg)  08/18/20 150 lb (68 kg)      Health Maintenance Due  Topic Date Due   Pneumococcal Vaccine 170-689 Years old (1 - PCV) Never done    There are no preventive care reminders to display for this patient.  Lab Results  Component Value Date   TSH 1.14 06/13/2019   Lab Results  Component Value Date   WBC 8.7 07/19/2020   HGB 14.6 07/19/2020   HCT 44.2 07/19/2020   MCV 95.5 07/19/2020   PLT 365 07/19/2020   Lab Results  Component Value Date   NA 142 07/19/2020   K 4.0 07/19/2020   CO2 25 07/19/2020   GLUCOSE 72 07/19/2020   BUN 14 07/19/2020   CREATININE 0.93 07/19/2020   BILITOT 0.2 07/19/2020   ALKPHOS 51 09/24/2015   AST 51 (H) 07/19/2020   ALT 65 (H) 07/19/2020   PROT 6.2 07/19/2020   ALBUMIN 3.9 09/24/2015   CALCIUM 10.2 07/19/2020   Lab Results  Component Value Date   CHOL 230 (H) 06/04/2019   Lab Results  Component Value Date   HDL 53 06/04/2019   Lab Results  Component Value Date   LDLCALC 141 (H) 06/04/2019   Lab Results  Component Value Date   TRIG 222 (H) 06/04/2019   Lab Results  Component Value Date   CHOLHDL 4.3 06/04/2019   Lab Results  Component Value Date   HGBA1C 5.3 01/01/2014      Assessment & Plan:   Problem List Items Addressed This Visit       Cardiovascular and Mediastinum   Migraine headache    Well controlled. Consider decreasing topamax. Will discuss again at f/u in 6 mo       Relevant Medications   primidone (MYSOLINE) 50 MG tablet   DULoxetine (CYMBALTA) 60 MG capsule   propranolol ER (INDERAL LA) 60 MG 24 hr capsule   cyclobenzaprine (FLEXERIL) 10 MG tablet   Hypertension - Primary    Well controlled. Continue current regimen. Follow up in  6 mo       Relevant Medications   propranolol ER (INDERAL LA) 60 MG 24 hr capsule   Other Relevant Orders   Lipid Panel w/reflex Direct LDL   COMPLETE METABOLIC PANEL WITH GFR   CBC   Vitamin B1   B12   Hemoglobin A1c   TSH     Digestive   GERD (gastroesophageal reflux disease)    Taking  daily PPI.        Relevant Medications   ondansetron (ZOFRAN-ODT) 4 MG disintegrating tablet   linaclotide (LINZESS) 290 MCG CAPS capsule     Nervous and Auditory   Essential tremor   Relevant Orders   Lipid Panel w/reflex Direct LDL   COMPLETE METABOLIC PANEL WITH GFR   CBC   Vitamin B1   B12   Hemoglobin A1c   TSH     Genitourinary   CKD (chronic kidney disease) stage 2, GFR 60-89 ml/min    Due to recheck renal function.       Relevant Orders   Lipid Panel w/reflex Direct LDL   COMPLETE METABOLIC PANEL WITH GFR   CBC   Vitamin B1   B12   Hemoglobin A1c   TSH     Other   Thiamin deficiency   Relevant Orders   Lipid Panel w/reflex Direct LDL   COMPLETE METABOLIC PANEL WITH GFR   CBC   Vitamin B1   B12   Hemoglobin A1c   TSH   Neck pain   Relevant Medications   DULoxetine (CYMBALTA) 60 MG capsule   Hyperlipidemia    Due for lipids.        Relevant Medications   propranolol ER (INDERAL LA) 60 MG 24 hr capsule  Other Relevant Orders   Lipid Panel w/reflex Direct LDL   COMPLETE METABOLIC PANEL WITH GFR   CBC   Vitamin B1   B12   Hemoglobin A1c   TSH   Fibromyalgia    Doing OK on Cymbalta. Happy with her current dose and regimen.        Relevant Medications   primidone (MYSOLINE) 50 MG tablet   DULoxetine (CYMBALTA) 60 MG capsule   cyclobenzaprine (FLEXERIL) 10 MG tablet   Other Visit Diagnoses     Need for immunization against influenza       Relevant Orders   Flu Vaccine QUAD 47mo+IM (Fluarix, Fluzone & Alfiuria Quad PF) (Completed)   Screening mammogram for breast cancer       Relevant Orders   MM 3D SCREEN BREAST BILATERAL   Need for tetanus, diphtheria, and acellular pertussis (Tdap) vaccine in patient of adolescent age or older       Relevant Orders   Tdap vaccine greater than or equal to 7yo IM (Completed)   Acute left-sided low back pain without sciatica       Relevant Medications   cyclobenzaprine (FLEXERIL) 10 MG tablet        Will d/c her metformin.    Meds ordered this encounter  Medications   DULoxetine (CYMBALTA) 60 MG capsule    Sig: TAKE 1 CAPSULE(60 MG) BY MOUTH DAILY    Dispense:  90 capsule    Refill:  1   ondansetron (ZOFRAN-ODT) 4 MG disintegrating tablet    Sig: DISSOLVE 1 TABLET(4 MG) ON THE TONGUE EVERY 8 HOURS AS NEEDED FOR NAUSEA OR VOMITING    Dispense:  20 tablet    Refill:  0   DISCONTD: metFORMIN (GLUCOPHAGE) 500 MG tablet    Sig: Take 1 tablet (500 mg total) by mouth 2 (two) times daily with a meal.    Dispense:  180 tablet    Refill:  0   propranolol ER (INDERAL LA) 60 MG 24 hr capsule    Sig: Take 1 capsule (60 mg total) by mouth daily.    Dispense:  90 capsule    Refill:  1   cyclobenzaprine (FLEXERIL) 10 MG tablet    Sig: Take 1 tablet (10 mg total) by mouth at bedtime as needed for muscle spasms.    Dispense:  90 tablet    Refill:  1   linaclotide (LINZESS) 290 MCG CAPS capsule    Sig: Take 1 capsule (290 mcg total) by mouth daily before breakfast.    Dispense:  90 capsule    Refill:  3    Follow-up: Return if symptoms worsen or fail to improve.    Nani Gasser, MD

## 2020-11-25 NOTE — Assessment & Plan Note (Signed)
Due for lipids.  

## 2020-11-25 NOTE — Patient Instructions (Signed)
Follow up after you have seen the Rhuematologist

## 2020-11-25 NOTE — Assessment & Plan Note (Signed)
Taking daily PPI.

## 2020-11-25 NOTE — Assessment & Plan Note (Signed)
Doing OK on Cymbalta. Happy with her current dose and regimen.

## 2020-12-07 NOTE — Progress Notes (Signed)
Carol Diaz, cholesterol looks better this year.  Great work and bringing that down!  Do look a little bit dry on your labs and make sure that you are hydrating well.  2 levels are little low so make sure you are getting enough protein in the diet on a daily basis.  Your blood count looks great no sign of anemia or infection.  B12 looks good.  And no sign of diabetes or prediabetes.  Thyroid is also normal.

## 2020-12-09 LAB — CBC
HCT: 38.1 % (ref 35.0–45.0)
Hemoglobin: 12.9 g/dL (ref 11.7–15.5)
MCH: 30.9 pg (ref 27.0–33.0)
MCHC: 33.9 g/dL (ref 32.0–36.0)
MCV: 91.1 fL (ref 80.0–100.0)
MPV: 9.5 fL (ref 7.5–12.5)
Platelets: 286 10*3/uL (ref 140–400)
RBC: 4.18 10*6/uL (ref 3.80–5.10)
RDW: 12.9 % (ref 11.0–15.0)
WBC: 8 10*3/uL (ref 3.8–10.8)

## 2020-12-09 LAB — COMPLETE METABOLIC PANEL WITH GFR
AG Ratio: 1.6 (calc) (ref 1.0–2.5)
ALT: 31 U/L — ABNORMAL HIGH (ref 6–29)
AST: 23 U/L (ref 10–35)
Albumin: 3.6 g/dL (ref 3.6–5.1)
Alkaline phosphatase (APISO): 61 U/L (ref 37–153)
BUN/Creatinine Ratio: 30 (calc) — ABNORMAL HIGH (ref 6–22)
BUN: 26 mg/dL — ABNORMAL HIGH (ref 7–25)
CO2: 28 mmol/L (ref 20–32)
Calcium: 9.5 mg/dL (ref 8.6–10.4)
Chloride: 105 mmol/L (ref 98–110)
Creat: 0.88 mg/dL (ref 0.50–1.03)
Globulin: 2.2 g/dL (calc) (ref 1.9–3.7)
Glucose, Bld: 87 mg/dL (ref 65–99)
Potassium: 4.2 mmol/L (ref 3.5–5.3)
Sodium: 139 mmol/L (ref 135–146)
Total Bilirubin: 0.3 mg/dL (ref 0.2–1.2)
Total Protein: 5.8 g/dL — ABNORMAL LOW (ref 6.1–8.1)
eGFR: 78 mL/min/{1.73_m2} (ref >=60)

## 2020-12-09 LAB — TSH: TSH: 0.84 mIU/L

## 2020-12-09 LAB — LIPID PANEL W/REFLEX DIRECT LDL
Cholesterol: 233 mg/dL — ABNORMAL HIGH (ref <200)
HDL: 81 mg/dL (ref >=50)
LDL Cholesterol (Calc): 127 mg/dL (calc) — ABNORMAL HIGH
Non-HDL Cholesterol (Calc): 152 mg/dL (calc) — ABNORMAL HIGH (ref <130)
Total CHOL/HDL Ratio: 2.9 (calc) (ref <5.0)
Triglycerides: 132 mg/dL (ref <150)

## 2020-12-09 LAB — VITAMIN B1: Vitamin B1 (Thiamine): 9 nmol/L (ref 8–30)

## 2020-12-09 LAB — HEMOGLOBIN A1C
Hgb A1c MFr Bld: 5.5 % of total Hgb (ref <5.7)
Mean Plasma Glucose: 111 mg/dL
eAG (mmol/L): 6.2 mmol/L

## 2020-12-09 LAB — VITAMIN B12: Vitamin B-12: 444 pg/mL (ref 200–1100)

## 2020-12-10 NOTE — Progress Notes (Signed)
Vitamin B1 is on the low end of normal.  So I would recommend just some over-the-counter B1 it is also called thiamine.  Recommend taking 1 a day.  Your B12 looks okay.

## 2020-12-16 ENCOUNTER — Other Ambulatory Visit: Payer: Self-pay | Admitting: Family Medicine

## 2020-12-17 ENCOUNTER — Other Ambulatory Visit: Payer: Self-pay

## 2020-12-17 ENCOUNTER — Encounter: Payer: Self-pay | Admitting: Family Medicine

## 2020-12-17 ENCOUNTER — Telehealth (INDEPENDENT_AMBULATORY_CARE_PROVIDER_SITE_OTHER): Payer: BC Managed Care – PPO | Admitting: Family Medicine

## 2020-12-17 DIAGNOSIS — J019 Acute sinusitis, unspecified: Secondary | ICD-10-CM

## 2020-12-17 MED ORDER — AZITHROMYCIN 250 MG PO TABS: ORAL_TABLET | ORAL | 0 refills | Status: AC

## 2020-12-17 NOTE — Telephone Encounter (Signed)
Needs an appointment.  Please let her know that I was out of town which is why did not have availability hopefully we had other openings this week that she could have slipped into.  I am not sure if we have anything left today but maybe we can squeeze her in for a virtual appointment at some point or in person.  Let me know if we do not have anything left available.

## 2020-12-17 NOTE — Progress Notes (Signed)
Virtual Visit via Telephone Note  I connected with Carol Diaz on 12/17/20 at  4:20 PM EDT by telephone and verified that I am speaking with the correct person using two identifiers.   I discussed the limitations, risks, security and privacy concerns of performing an evaluation and management service by telephone and the availability of in person appointments. I also discussed with the patient that there may be a patient responsible charge related to this service. The patient expressed understanding and agreed to proceed.  Patient location: in her car Provider loccation: In office   Subjective:    CC: Sinus sxs   HPI: 55 year old female with a history of recurrent sinusitis complains of 7 days of sinus congestion that has been getting gradually worse to the point where she is getting a lot of pressure and green nasal discharge.  No nausea vomiting or diarrhea.  No fevers chills or sweats.  Again it just is getting progressively worse.  She has tested for COVID twice.  She does work in the school system.   Past medical history, Surgical history, Family history not pertinant except as noted below, Social history, Allergies, and medications have been entered into the medical record, reviewed, and corrections made.   Review of Systems: No fevers, chills, night sweats, weight loss, chest pain, or shortness of breath.   Objective:    General: Speaking clearly in complete sentences without any shortness of breath.  Alert and oriented x3.  Normal judgment. No apparent acute distress.    Impression and Recommendations:    Acute sinusitis-we will treat with azithromycin.  Recommend hydration, running humidifier, nasal saline irrigation etc.  Call if not better in 1 week.      I discussed the assessment and treatment plan with the patient. The patient was provided an opportunity to ask questions and all were answered. The patient agreed with the plan and demonstrated an understanding of  the instructions.   The patient was advised to call back or seek an in-person evaluation if the symptoms worsen or if the condition fails to improve as anticipated.  I provided 10 minutes of non-face-to-face time during this encounter.   Nani Gasser, MD

## 2020-12-22 ENCOUNTER — Ambulatory Visit: Payer: BC Managed Care – PPO | Admitting: Internal Medicine

## 2021-01-06 ENCOUNTER — Ambulatory Visit: Payer: BC Managed Care – PPO

## 2021-01-11 ENCOUNTER — Other Ambulatory Visit: Payer: Self-pay | Admitting: Family Medicine

## 2021-01-13 NOTE — Progress Notes (Deleted)
Office Visit Note  Patient: Carol Diaz             Date of Birth: [birth year]           MRN: [id number]             PCP: Hali Marry, MD Referring: Hali Marry, * Visit Date: 01/24/2021 Occupation: '@GUAROCC' @  Subjective:  No chief complaint on file.   History of Present Illness: Carol Diaz is a 55 y.o. female ***   Activities of Daily Living:  Patient reports morning stiffness for *** {minute/hour:19697}.   Patient {ACTIONS;DENIES/REPORTS:21021675::"Denies"} nocturnal pain.  Difficulty dressing/grooming: {ACTIONS;DENIES/REPORTS:21021675::"Denies"} Difficulty climbing stairs: {ACTIONS;DENIES/REPORTS:21021675::"Denies"} Difficulty getting out of chair: {ACTIONS;DENIES/REPORTS:21021675::"Denies"} Difficulty using hands for taps, buttons, cutlery, and/or writing: {ACTIONS;DENIES/REPORTS:21021675::"Denies"}  No Rheumatology ROS completed.   PMFS History:  Patient Active Problem List   Diagnosis Date Noted   GERD (gastroesophageal reflux disease) 11/25/2020   Renal insufficiency    Hypertension    Chronic fatigue syndrome    Sjogren's disease ([place]) 03/17/2020   Thiamin deficiency 09/30/2019   Localized swelling of both lower extremities 08/01/2019   Blue toes 08/01/2019   Tachycardia 06/13/2019   Abnormal ankle brachial index (ABI) 10/07/2018   Fibromyalgia 07/17/2016   CKD (chronic kidney disease) stage 2, GFR 60-89 ml/min 09/27/2015   Neck pain 06/10/2015   Obesity 09/25/2013   Essential tremor 02/02/2012   Hyperlipidemia 12/15/2011   GROSS HEMATURIA 04/06/2010   Iron deficiency anemia 09/22/2009   PALPITATIONS 09/22/2009   FATIGUE 08/31/2009   HYPOGLYCEMIA, UNSPECIFIED 11/20/2008   MENORRHAGIA 09/05/2007   DEPRESSION, MAJOR, RECURRENT 01/02/2006   Migraine headache 01/02/2006   IRRITABLE BOWEL SYNDROME 01/02/2006   INSOMNIA NOS 01/02/2006    Past Medical History:  Diagnosis Date   Chronic fatigue syndrome    Hypertension     Migraines    Palpitations    Renal insufficiency    Sjogren's disease ([place]) 03/17/2020    Family History  Problem Relation Age of Onset   Emphysema Mother    Hypertension Father    Heart disease Brother 11       mi   Past Surgical History:  Procedure Laterality Date   CESAREAN SECTION     ivp     LAPAROSCOPIC APPENDECTOMY N/A 05/17/2017   Procedure: DIAGNOSTIC LAPAROSCOPY APPENDECTOMY;  Surgeon: Leighton Ruff, MD;  Location: [facility];  Service: General;  Laterality: N/A;   TUBAL LIGATION     Social History   Social History Narrative   Some exercise.    Immunization History  Administered Date(s) Administered   Influenza Split 12/08/2011   Influenza Whole 01/25/2005, 01/14/2010   Influenza,inj,Quad PF,6+ Mos 01/07/2014, 12/11/2014, 12/24/2015, 02/12/2017, 11/15/2017, 11/25/2020   Influenza-Unspecified 12/25/2012   PFIZER(Purple Top)SARS-COV-2 Vaccination 05/24/2019   Td 03/15/2010   Tdap 11/25/2020     Objective: Vital Signs: LMP 04/22/2018 (Exact Date)    Physical Exam   Musculoskeletal Exam: ***  CDAI Exam: CDAI Score: -- Patient Global: --; Provider Global: -- Swollen: --; Tender: -- Joint Exam 01/24/2021   No joint exam has been documented for this visit   There is currently no information documented on the homunculus. Go to the Rheumatology activity and complete the homunculus joint exam.  Investigation: No additional findings.  Imaging: No results found.  Recent Labs: Lab Results  Component Value Date   WBC 8.0 12/06/2020   HGB 12.9 12/06/2020   PLT 286 12/06/2020   NA 139 12/06/2020   K 4.2 12/06/2020  CL 105 12/06/2020   CO2 28 12/06/2020   GLUCOSE 87 12/06/2020   BUN 26 (H) 12/06/2020   CREATININE 0.88 12/06/2020   BILITOT 0.3 12/06/2020   ALKPHOS 51 09/24/2015   AST 23 12/06/2020   ALT 31 (H) 12/06/2020   PROT 5.8 (L) 12/06/2020   ALBUMIN 3.9 09/24/2015   CALCIUM 9.5 12/06/2020   GFRAA 81 07/19/2020    Speciality Comments: No  specialty comments available.  Procedures:  No procedures performed Allergies: Fluoxetine, Lexapro [escitalopram oxalate], Morphine, and Sertraline   Assessment / Plan:     Visit Diagnoses: Polyarthralgia - 06/04/19: ANA negative, ESR 6, CRP 13.7, Mg 2.1, CK 55. 06/13/19: EBV IgG>750, CMV IgG 3.20, CRP 12.8, ESR 9, TSH 1.14  High risk medication use - PLQ 200 mg 1 tablet BID. Previously treated with MTX  History of Sjogren's disease ([place])  Fibromyalgia  Weakness  Blurry vision  Migraine without aura and without status migrainosus, not intractable  Primary hypertension  History of IBS  History of gastroesophageal reflux (GERD)  Essential tremor  Chronic fatigue syndrome  Renal insufficiency  CKD (chronic kidney disease) stage 2, GFR 60-89 ml/min  Thiamin deficiency  History of iron deficiency anemia  History of depression  Orders: No orders of the defined types were placed in this encounter.  No orders of the defined types were placed in this encounter.   Face-to-face time spent with patient was *** minutes. Greater than 50% of time was spent in counseling and coordination of care.  Follow-Up Instructions: No follow-ups on file.   Ofilia Neas, PA-C  Note - This record has been created using Dragon software.  Chart creation errors have been sought, but may not always  have been located. Such creation errors do not reflect on  the standard of medical care.

## 2021-01-24 ENCOUNTER — Encounter: Payer: Self-pay | Admitting: Physician Assistant

## 2021-01-24 ENCOUNTER — Telehealth: Payer: BC Managed Care – PPO | Admitting: Physician Assistant

## 2021-01-24 ENCOUNTER — Ambulatory Visit: Payer: BC Managed Care – PPO | Admitting: Rheumatology

## 2021-01-24 ENCOUNTER — Encounter: Payer: Self-pay | Admitting: Family Medicine

## 2021-01-24 DIAGNOSIS — M3500 Sicca syndrome, unspecified: Secondary | ICD-10-CM

## 2021-01-24 DIAGNOSIS — G9332 Myalgic encephalomyelitis/chronic fatigue syndrome: Secondary | ICD-10-CM

## 2021-01-24 DIAGNOSIS — Z8659 Personal history of other mental and behavioral disorders: Secondary | ICD-10-CM

## 2021-01-24 DIAGNOSIS — H538 Other visual disturbances: Secondary | ICD-10-CM

## 2021-01-24 DIAGNOSIS — Z8719 Personal history of other diseases of the digestive system: Secondary | ICD-10-CM

## 2021-01-24 DIAGNOSIS — Z862 Personal history of diseases of the blood and blood-forming organs and certain disorders involving the immune mechanism: Secondary | ICD-10-CM

## 2021-01-24 DIAGNOSIS — U071 COVID-19: Secondary | ICD-10-CM | POA: Diagnosis not present

## 2021-01-24 DIAGNOSIS — N289 Disorder of kidney and ureter, unspecified: Secondary | ICD-10-CM

## 2021-01-24 DIAGNOSIS — G43009 Migraine without aura, not intractable, without status migrainosus: Secondary | ICD-10-CM

## 2021-01-24 DIAGNOSIS — M797 Fibromyalgia: Secondary | ICD-10-CM

## 2021-01-24 DIAGNOSIS — R531 Weakness: Secondary | ICD-10-CM

## 2021-01-24 DIAGNOSIS — M2550 Pain in unspecified joint: Secondary | ICD-10-CM

## 2021-01-24 DIAGNOSIS — E519 Thiamine deficiency, unspecified: Secondary | ICD-10-CM

## 2021-01-24 DIAGNOSIS — N182 Chronic kidney disease, stage 2 (mild): Secondary | ICD-10-CM

## 2021-01-24 DIAGNOSIS — Z79899 Other long term (current) drug therapy: Secondary | ICD-10-CM

## 2021-01-24 DIAGNOSIS — G250 Essential tremor: Secondary | ICD-10-CM

## 2021-01-24 DIAGNOSIS — I10 Essential (primary) hypertension: Secondary | ICD-10-CM

## 2021-01-24 MED ORDER — FLUTICASONE PROPIONATE 50 MCG/ACT NA SUSP: 2.0000 | Freq: Every day | NASAL | 0 refills | Status: DC

## 2021-01-24 MED ORDER — BENZONATATE 100 MG PO CAPS: 100.0000 mg | ORAL_CAPSULE | Freq: Three times a day (TID) | ORAL | 0 refills | Status: AC

## 2021-01-24 MED ORDER — MOLNUPIRAVIR EUA 200MG CAPSULE: 4.0000 | ORAL_CAPSULE | Freq: Two times a day (BID) | ORAL | 0 refills | Status: AC

## 2021-01-24 NOTE — Progress Notes (Signed)
[name], Carol Diaz are scheduled for a virtual visit with your provider today.    Just as we do with appointments in the office, we must obtain your consent to participate.  Your consent will be active for this visit and any virtual visit you may have with one of our providers in the next 365 days.    If you have a MyChart account, I can also send a copy of this consent to you electronically.  All virtual visits are billed to your insurance company just like a traditional visit in the office.  As this is a virtual visit, video technology does not allow for your provider to perform a traditional examination.  This may limit your provider's ability to fully assess your condition.  If your provider identifies any concerns that need to be evaluated in person or the need to arrange testing such as labs, EKG, etc, we will make arrangements to do so.    Although advances in technology are sophisticated, we cannot ensure that it will always work on either your end or our end.  If the connection with a video visit is poor, we may have to switch to a telephone visit.  With either a video or telephone visit, we are not always able to ensure that we have a secure connection.   I need to obtain your verbal consent now.   Are you willing to proceed with your visit today?   Carol Diaz has provided verbal consent on 01/24/2021 for a virtual visit (video or telephone).   Karrie Meres, PA-C 01/24/2021  2:07 PM   Date:  01/24/2021   ID:  Carol Diaz, DOB 23-Jan-1966, MRN [id number]  Patient Location: Home Provider Location: Home Office   Participants: Patient and Provider for Visit and Wrap up  Method of visit: Video  Location of Patient: Home Location of Provider: Home Office Consent was obtain for visit over the video. Services rendered by provider: Visit was performed via video  A video enabled telemedicine application was used and I verified that I am speaking with the correct person using two  identifiers.  PCP:  Agapito Games, MD   Chief Complaint:  COVID  History of Present Illness:    Carol Diaz is a 55 y.o. female with history as stated below. Presents video telehealth for an acute care visit  She started having body aches, sore throat, congestion, mild cough that started 2 days ago. She further reports a low grade temp. COVID test positive today. She has tried tylenol for body aches.   Past Medical, Surgical, Social History, Allergies, and Medications have been Reviewed.  Past Medical History:  Diagnosis Date   Chronic fatigue syndrome    Hypertension    Migraines    Palpitations    Renal insufficiency    Sjogren's disease ([facility]) 03/17/2020    No outpatient medications have been marked as taking for the 01/24/21 encounter (Appointment) with Samson Frederic, Joel Cowin S, PA-C.     Allergies:   Fluoxetine, Lexapro [escitalopram oxalate], Morphine, and Sertraline   ROS See HPI for history of present illness.  Physical Exam Vitals and nursing note reviewed.  Constitutional:      General: She is not in acute distress.    Appearance: She is well-developed.  HENT:     Head: Normocephalic and atraumatic.  Eyes:     Conjunctiva/sclera: Conjunctivae normal.  Pulmonary:     Effort: Pulmonary effort is normal.  Musculoskeletal:  General: Normal range of motion.     Cervical back: Neck supple.  Neurological:     Mental Status: She is alert.            MDM: pt with covid. Hx immunosuppression and on mult meds, some which interact with paxlovid. Rx for molnupirovir given. Rx for fluticasone and tessalon given as well. Advised on f/u.  There are no diagnoses linked to this encounter.   Time:   Today, I have spent 10 minutes with the patient with telehealth technology discussing the above problems, reviewing the chart, previous notes, medications and orders.    Tests Ordered: No orders of the defined types were placed in this encounter.   Medication  Changes: No orders of the defined types were placed in this encounter.    Disposition:  Follow up  Signed, Rodney Booze, PA-C  01/24/2021 2:07 PM

## 2021-01-24 NOTE — Patient Instructions (Signed)
Marlowe Alt, thank you for joining Karrie Meres, PA-C for today's virtual visit.  While this provider is not your primary care provider (PCP), if your PCP is located in our provider database this encounter information will be shared with them immediately following your visit.  Consent: (Patient) Carol Diaz provided verbal consent for this virtual visit at the beginning of the encounter.  Current Medications:  Current Outpatient Medications:    cetirizine (ZYRTEC) 10 MG tablet, TK 1 T PO Q 24 H, Disp: , Rfl:    cyanocobalamin (,VITAMIN B-12,) 1000 MCG/ML injection, every 30 (thirty) days., Disp: , Rfl:    cyclobenzaprine (FLEXERIL) 10 MG tablet, Take 1 tablet (10 mg total) by mouth at bedtime as needed for muscle spasms., Disp: 90 tablet, Rfl: 1   DULoxetine (CYMBALTA) 60 MG capsule, TAKE 1 CAPSULE(60 MG) BY MOUTH DAILY, Disp: 90 capsule, Rfl: 1   fluticasone (FLONASE) 50 MCG/ACT nasal spray, One spray in each nostril twice a day, use left hand for right nostril, and right hand for left nostril., Disp: 48 g, Rfl: 3   folic acid (FOLVITE) 1 MG tablet, Take 1 mg by mouth daily., Disp: , Rfl:    hydrochlorothiazide (HYDRODIURIL) 12.5 MG tablet, Take 12.5 mg by mouth every morning., Disp: , Rfl:    hydroxychloroquine (PLAQUENIL) 200 MG tablet, Take 200 mg by mouth 2 (two) times daily., Disp: , Rfl:    LINZESS 290 MCG CAPS capsule, TAKE 1 CAPSULE(290 MCG) BY MOUTH DAILY BEFORE BREAKFAST, Disp: 90 capsule, Rfl: 3   omeprazole (PRILOSEC) 40 MG capsule, Take 1 capsule by mouth daily., Disp: , Rfl:    ondansetron (ZOFRAN-ODT) 4 MG disintegrating tablet, DISSOLVE ONE TABLET ON THE TONGUE EVERY 8 HOURS AS NEEDED FOR NAUSEA OR VOMITING, Disp: 20 tablet, Rfl: 0   primidone (MYSOLINE) 50 MG tablet, Take 100 mg by mouth 2 (two) times daily., Disp: , Rfl:    propranolol ER (INDERAL LA) 60 MG 24 hr capsule, Take 1 capsule (60 mg total) by mouth daily., Disp: 90 capsule, Rfl: 1   SUMAtriptan (IMITREX)  100 MG tablet, TAKE 1 TABLET BY MOUTH EVERY 2 HOURS AS NEEDED MIGRAINES AS DIRECTED, Disp: 10 tablet, Rfl: 11   Thiamine HCl (B-1) 100 MG TABS, Take 1 tablet by mouth 2 (two) times daily., Disp: , Rfl:    topiramate (TOPAMAX) 50 MG tablet, Take 50 mg by mouth 2 (two) times daily., Disp: , Rfl:    traMADol (ULTRAM) 50 MG tablet, Take 50 mg by mouth 3 (three) times daily as needed for moderate pain., Disp: , Rfl:    Vitamin D, Ergocalciferol, (DRISDOL) 1.25 MG (50000 UNIT) CAPS capsule, Take 50,000 Units by mouth once a week., Disp: , Rfl:    Medications ordered in this encounter:  No orders of the defined types were placed in this encounter.    *If you need refills on other medications prior to your next appointment, please contact your pharmacy*  Follow-Up: Call back or seek an in-person evaluation if the symptoms worsen or if the condition fails to improve as anticipated.  Other Instructions Take molnupirovir, fluticasone and tessalon as directed   Follow up with your doctor in 1 week or seek care sooner for worsening symptoms.   If you have been instructed to have an in-person evaluation today at a local Urgent Care facility, please use the link below. It will take you to a list of all of our available [facility], including address, phone  number and hours of operation. Please do not delay care.  [facility]  If you or a family member do not have a primary care provider, use the link below to schedule a visit and establish care. When you choose a [facility] primary care physician or advanced practice provider, you gain a long-term partner in health. Find a Primary Care Provider  Learn more about [facility] in-office and virtual care options: [facility]

## 2021-02-02 ENCOUNTER — Encounter: Payer: Self-pay | Admitting: Family Medicine

## 2021-02-03 ENCOUNTER — Telehealth (INDEPENDENT_AMBULATORY_CARE_PROVIDER_SITE_OTHER): Payer: BC Managed Care – PPO | Admitting: Family Medicine

## 2021-02-03 ENCOUNTER — Other Ambulatory Visit: Payer: Self-pay

## 2021-02-03 ENCOUNTER — Encounter: Payer: Self-pay | Admitting: Family Medicine

## 2021-02-03 VITALS — Wt 150.0 lb

## 2021-02-03 DIAGNOSIS — J0140 Acute pansinusitis, unspecified: Secondary | ICD-10-CM | POA: Diagnosis not present

## 2021-02-03 MED ORDER — AMOXICILLIN-POT CLAVULANATE 875-125 MG PO TABS: 1.0000 | ORAL_TABLET | Freq: Two times a day (BID) | ORAL | 0 refills | Status: DC

## 2021-02-03 MED ORDER — PREDNISONE 20 MG PO TABS: 40.0000 mg | ORAL_TABLET | Freq: Every day | ORAL | 0 refills | Status: AC

## 2021-02-03 NOTE — Patient Instructions (Signed)
It was good to see you (virtually) today! I'm so sorry you are not feeling well. I've sent in Augmentin and a short burst of prednisone. Hopefully this will help you get over this quickly. Please rest up, stay hydrated, run a humidifier, take steam showers, warm liquids with honey and lemon, and over-the-counter cough/cold/pain relievers as needed. You should start to notice some improvement within a few days of starting these medications.   Please contact office for follow-up if symptoms do not improve or worsen. Seek emergency care if symptoms become severe.

## 2021-02-03 NOTE — Progress Notes (Signed)
Virtual Video Visit via MyChart Note  I connected with  Carol Diaz on 02/03/21 at 11:10 AM EST by the video enabled telemedicine application for MyChart, and verified that I am speaking with the correct person using two identifiers.   I introduced myself as a Publishing rights manager with the practice. We discussed the limitations of evaluation and management by telemedicine and the availability of in person appointments. The patient expressed understanding and agreed to proceed.  Participating parties in this visit include: The patient and the nurse practitioner listed.  The patient is: At home I am: In the office - Primary Care [name]  Subjective:    CC:  Chief Complaint  Patient presents with   Covid Positive    Since 01/24/21    HPI: Carol Diaz is a 55 y.o. year old female presenting today via MyChart today for ongoing COVID symptoms.  01/24/21 virtual visit for 2 days of body aches, sore throat, congestion, cough. Positive COVID test. Started on molnupiravir, Flonase, and tessalon.  Today she reports 8/10 pansinusitis pressure that is gradually worsening since having COVID, greenish-yellow drainage/mucus, sore throat, coughing.  She denies any headaches, fever, chills, fatigue, body aches, nausea, vomiting, diarrhea, ear pain, trouble breathing or wheezing, chest pain.    Past medical history, Surgical history, Family history not pertinant except as noted below, Social history, Allergies, and medications have been entered into the medical record, reviewed, and corrections made.   Review of Systems:  All review of systems negative except what is listed in the HPI   Objective:    General:  Speaking clearly in complete sentences. Absent shortness of breath noted.   Alert and oriented x3.   Normal judgment.  Absent acute distress.   Impression and Recommendations:    1. Acute non-recurrent pansinusitis Given duration and double sickening we will go ahead and  treat with Augmentin and a short burst of prednisone for her significant symptoms.  Recommending continue supportive measures including rest, hydration, humidifier use, warm compresses, steam showers, warm liquids with honey and lemon, over-the-counter cough/cold/analgesics.  Patient aware of signs symptoms requiring further evaluation.  - amoxicillin-clavulanate (AUGMENTIN) 875-125 MG tablet; Take 1 tablet by mouth 2 (two) times daily for 10 days.  Dispense: 20 tablet; Refill: 0 - predniSONE (DELTASONE) 20 MG tablet; Take 2 tablets (40 mg total) by mouth daily with breakfast for 5 days.  Dispense: 10 tablet; Refill: 0   Follow-up if symptoms worsen or fail to improve.    I discussed the assessment and treatment plan with the patient. The patient was provided an opportunity to ask questions and all were answered. The patient agreed with the plan and demonstrated an understanding of the instructions.   The patient was advised to call back or seek an in-person evaluation if the symptoms worsen or if the condition fails to improve as anticipated.  I spent 20 minutes dedicated to the care of this patient on the date of this encounter to include pre-visit chart review of prior notes and results, face-to-face time with the patient, and post-visit ordering of testing as indicated.   Clayborne Dana, NP

## 2021-02-11 ENCOUNTER — Encounter: Payer: Self-pay | Admitting: Family Medicine

## 2021-02-11 ENCOUNTER — Ambulatory Visit (INDEPENDENT_AMBULATORY_CARE_PROVIDER_SITE_OTHER): Payer: BC Managed Care – PPO | Admitting: Family Medicine

## 2021-02-11 ENCOUNTER — Other Ambulatory Visit: Payer: Self-pay

## 2021-02-11 ENCOUNTER — Telehealth: Payer: Self-pay | Admitting: *Deleted

## 2021-02-11 ENCOUNTER — Ambulatory Visit (INDEPENDENT_AMBULATORY_CARE_PROVIDER_SITE_OTHER): Payer: BC Managed Care – PPO

## 2021-02-11 VITALS — BP 119/70 | HR 80 | Ht 66.0 in | Wt 163.0 lb

## 2021-02-11 DIAGNOSIS — R220 Localized swelling, mass and lump, head: Secondary | ICD-10-CM | POA: Diagnosis not present

## 2021-02-11 DIAGNOSIS — E519 Thiamine deficiency, unspecified: Secondary | ICD-10-CM

## 2021-02-11 DIAGNOSIS — H539 Unspecified visual disturbance: Secondary | ICD-10-CM

## 2021-02-11 DIAGNOSIS — R29898 Other symptoms and signs involving the musculoskeletal system: Secondary | ICD-10-CM

## 2021-02-11 NOTE — Progress Notes (Signed)
.  Acute Office Visit  Subjective:    Patient ID: Carol Diaz, female    DOB: 1966-01-17, 55 y.o.   MRN: [id number]  Chief Complaint  Patient presents with   Follow-up         HPI Patient is in today for Pt reports that she has been experiencing leg weakness and pain. She said that it is hard for her to walk up steps or to squat down without needing to grab onto something to pull herself up.  Has been off of steroids for about 3 weeks.     Facial swelling x 1.5 wks, and burred vision x 1 month that she notices more after working. She has been using eye drops that were given for dry eyes but this doesn't really help. She hasn't had an eye exam in about 1 year  She has been having vision change in her left eye . Says it is blurry and occ has double vision.  She thought initially it might be her dry eye but she says she is been very consistent with her Restasis.  She missed her last eye appointment and has not had a chance to reschedule it she seen at an office in [place] she is on Plaquenil   Past Medical History:  Diagnosis Date   Chronic fatigue syndrome    Hypertension    Migraines    Palpitations    Renal insufficiency    Sjogren's disease ([place]) 03/17/2020    Past Surgical History:  Procedure Laterality Date   CESAREAN SECTION     ivp     LAPAROSCOPIC APPENDECTOMY N/A 05/17/2017   Procedure: DIAGNOSTIC LAPAROSCOPY APPENDECTOMY;  Surgeon: Leighton Ruff, MD;  Location: [facility];  Service: General;  Laterality: N/A;   TUBAL LIGATION      Family History  Problem Relation Age of Onset   Emphysema Mother    Hypertension Father    Heart disease Brother 39       mi    Social History   Socioeconomic History   Marital status: Married    Spouse name: Not on file   Number of children: 2   Years of education: Not on file   Highest education level: Not on file  Occupational History    Employer: [employer]  Tobacco Use   Smoking status: Never    Smokeless tobacco: Never  Substance and Sexual Activity   Alcohol use: No   Drug use: No   Sexual activity: Yes    Partners: Male    Birth control/protection: Pill  Other Topics Concern   Not on file  Social History Narrative   Some exercise.    Social Determinants of Health   Financial Resource Strain: Not on file  Food Insecurity: Not on file  Transportation Needs: Not on file  Physical Activity: Not on file  Stress: Not on file  Social Connections: Not on file  Intimate Partner Violence: Not on file    Outpatient Medications Prior to Visit  Medication Sig Dispense Refill   magic mouthwash SOLN SWISH AND SPIT 15MLS BY MOUTH THREE TIMES DAILY AS NEEDED     cetirizine (ZYRTEC) 10 MG tablet TK 1 T PO Q 24 H     cyanocobalamin (,VITAMIN B-12,) 1000 MCG/ML injection every 30 (thirty) days.     cyclobenzaprine (FLEXERIL) 10 MG tablet Take 1 tablet (10 mg total) by mouth at bedtime as needed for muscle spasms. 90 tablet 1   DULoxetine (CYMBALTA) 60 MG  capsule TAKE 1 CAPSULE(60 MG) BY MOUTH DAILY 90 capsule 1   fluticasone (FLONASE) 50 MCG/ACT nasal spray Place 2 sprays into both nostrils daily. 16 g 0   folic acid (FOLVITE) 1 MG tablet Take 1 mg by mouth daily.     hydrochlorothiazide (HYDRODIURIL) 12.5 MG tablet Take 12.5 mg by mouth every morning.     hydroxychloroquine (PLAQUENIL) 200 MG tablet Take 200 mg by mouth 2 (two) times daily.     LINZESS 290 MCG CAPS capsule TAKE 1 CAPSULE(290 MCG) BY MOUTH DAILY BEFORE BREAKFAST 90 capsule 3   omeprazole (PRILOSEC) 40 MG capsule Take 1 capsule by mouth daily.     ondansetron (ZOFRAN-ODT) 4 MG disintegrating tablet DISSOLVE ONE TABLET ON THE TONGUE EVERY 8 HOURS AS NEEDED FOR NAUSEA OR VOMITING 20 tablet 0   primidone (MYSOLINE) 50 MG tablet Take 100 mg by mouth 2 (two) times daily.     propranolol ER (INDERAL LA) 60 MG 24 hr capsule Take 1 capsule (60 mg total) by mouth daily. 90 capsule 1   SUMAtriptan (IMITREX) 100 MG tablet TAKE 1  TABLET BY MOUTH EVERY 2 HOURS AS NEEDED MIGRAINES AS DIRECTED 10 tablet 11   Thiamine HCl (B-1) 100 MG TABS Take 1 tablet by mouth 2 (two) times daily.     topiramate (TOPAMAX) 50 MG tablet Take 50 mg by mouth 2 (two) times daily.     traMADol (ULTRAM) 50 MG tablet Take 50 mg by mouth 3 (three) times daily as needed for moderate pain.     Vitamin D, Ergocalciferol, (DRISDOL) 1.25 MG (50000 UNIT) CAPS capsule Take 50,000 Units by mouth once a week.     amoxicillin-clavulanate (AUGMENTIN) 875-125 MG tablet Take 1 tablet by mouth 2 (two) times daily for 10 days. 20 tablet 0   fluticasone (FLONASE) 50 MCG/ACT nasal spray One spray in each nostril twice a day, use left hand for right nostril, and right hand for left nostril. 48 g 3   No facility-administered medications prior to visit.    Allergies  Allergen Reactions   Fluoxetine Other (See Comments)    Made her tremor worse    Lexapro [Escitalopram Oxalate] Other (See Comments)    Headache   Morphine Nausea And Vomiting   Sertraline Other (See Comments)    Headache     Review of Systems     Objective:    Physical Exam  BP 119/70   Pulse 80   Ht 5' 6" (1.676 m)   Wt 163 lb (73.9 kg)   LMP 04/22/2018 (Exact Date)   SpO2 99%   BMI 26.31 kg/m  Wt Readings from Last 3 Encounters:  02/11/21 163 lb (73.9 kg)  02/03/21 150 lb (68 kg)  11/25/20 151 lb (68.5 kg)    There are no preventive care reminders to display for this patient.  There are no preventive care reminders to display for this patient.   Lab Results  Component Value Date   TSH 0.84 12/06/2020   Lab Results  Component Value Date   WBC 8.0 12/06/2020   HGB 12.9 12/06/2020   HCT 38.1 12/06/2020   MCV 91.1 12/06/2020   PLT 286 12/06/2020   Lab Results  Component Value Date   NA 139 12/06/2020   K 4.2 12/06/2020   CO2 28 12/06/2020   GLUCOSE 87 12/06/2020   BUN 26 (H) 12/06/2020   CREATININE 0.88 12/06/2020   BILITOT 0.3 12/06/2020   ALKPHOS 51  09/24/2015   AST 23  12/06/2020   ALT 31 (H) 12/06/2020   PROT 5.8 (L) 12/06/2020   ALBUMIN 3.9 09/24/2015   CALCIUM 9.5 12/06/2020   EGFR 78 12/06/2020   Lab Results  Component Value Date   CHOL 233 (H) 12/06/2020   Lab Results  Component Value Date   HDL 81 12/06/2020   Lab Results  Component Value Date   LDLCALC 127 (H) 12/06/2020   Lab Results  Component Value Date   TRIG 132 12/06/2020   Lab Results  Component Value Date   CHOLHDL 2.9 12/06/2020   Lab Results  Component Value Date   HGBA1C 5.5 12/06/2020       Assessment & Plan:   Problem List Items Addressed This Visit   None Visit Diagnoses     Weakness of both lower extremities    -  Primary   Relevant Orders   CBC with Differential/Platelet   CK (Creatine Kinase)   Cortisol   DG Lumbar Spine Complete   Sedimentation rate   Comprehensive Metabolic Panel (CMET)   Swelling of face       Relevant Orders   CBC with Differential/Platelet   CK (Creatine Kinase)   Cortisol   DG Lumbar Spine Complete   Sedimentation rate   Comprehensive Metabolic Panel (CMET)   Change in vision       Vitamin B1 deficiency          Vision changes- I am concerned that she is having blurry vision in her left eye and she is on Plaquenil and has not had an eye exam in a year.  We will try to get her scheduled back with her eye care provider soon as possible.  Continue with the Restasis for now  Lower extremity weakness-unclear etiology she does seem a little weak with hip flexion bilaterally but it seems fairly symmetric.  I would really like to start with plain lumbar films and then consider MRI for further evaluation she also reports some upper extremity weakness as well but the lower extremity weakness is more pronounced.  Consider causes such as spinal stenosis.  She said she was told several years ago that she did have that.  I could not find an old image on her file.  She had had a cervical MRI about 18 months ago that  showed some arthritis but no narrowing or compression of the spinal coed.   Swelling of the face-she has significant swelling of her face today consistent with a moon face.  I would like to check her cortisol levels to see if they might be low.  Consider endocrinology referral as well.  We did discuss that this can be a significant side effect of being on steroids.  But she has been off of them for about 3 weeks now  Vitamin B1 deficiency-she is taking her thiamine supplement.  No orders of the defined types were placed in this encounter.    Beatrice Lecher, MD

## 2021-02-11 NOTE — Telephone Encounter (Signed)
Error

## 2021-02-11 NOTE — Progress Notes (Signed)
Pt reports that she has been experiencing leg weakness and pain. She said that it is hard for her to walk up steps or to squat down without needing to grab onto something to pull herself up.   Facial swelling x 1.5 wks, and burred vision x 1 month that she notices more after working. She has been using eye drops that were given for dry eyes but this doesn't really help. She hasn't had an eye exam in about 1 year.  She sees Dr. Neale Burly in [place] at the [facility]

## 2021-02-12 LAB — CBC WITH DIFFERENTIAL/PLATELET
Absolute Monocytes: 1165 cells/uL — ABNORMAL HIGH (ref 200–950)
Basophils Absolute: 18 cells/uL (ref 0–200)
Basophils Relative: 0.2 %
Eosinophils Absolute: 9 cells/uL — ABNORMAL LOW (ref 15–500)
Eosinophils Relative: 0.1 %
HCT: 38.9 % (ref 35.0–45.0)
Hemoglobin: 13 g/dL (ref 11.7–15.5)
Lymphs Abs: 2020 cells/uL (ref 850–3900)
MCH: 30 pg (ref 27.0–33.0)
MCHC: 33.4 g/dL (ref 32.0–36.0)
MCV: 89.8 fL (ref 80.0–100.0)
MPV: 9.6 fL (ref 7.5–12.5)
Monocytes Relative: 12.8 %
Neutro Abs: 5888 cells/uL (ref 1500–7800)
Neutrophils Relative %: 64.7 %
Platelets: 395 10*3/uL (ref 140–400)
RBC: 4.33 10*6/uL (ref 3.80–5.10)
RDW: 13.5 % (ref 11.0–15.0)
Total Lymphocyte: 22.2 %
WBC: 9.1 10*3/uL (ref 3.8–10.8)

## 2021-02-12 LAB — COMPREHENSIVE METABOLIC PANEL
AG Ratio: 1.4 (calc) (ref 1.0–2.5)
ALT: 44 U/L — ABNORMAL HIGH (ref 6–29)
AST: 30 U/L (ref 10–35)
Albumin: 3.6 g/dL (ref 3.6–5.1)
Alkaline phosphatase (APISO): 77 U/L (ref 37–153)
BUN: 24 mg/dL (ref 7–25)
CO2: 29 mmol/L (ref 20–32)
Calcium: 9.5 mg/dL (ref 8.6–10.4)
Chloride: 102 mmol/L (ref 98–110)
Creat: 0.87 mg/dL (ref 0.50–1.03)
Globulin: 2.6 g/dL (calc) (ref 1.9–3.7)
Glucose, Bld: 76 mg/dL (ref 65–99)
Potassium: 4.3 mmol/L (ref 3.5–5.3)
Sodium: 136 mmol/L (ref 135–146)
Total Bilirubin: 0.3 mg/dL (ref 0.2–1.2)
Total Protein: 6.2 g/dL (ref 6.1–8.1)

## 2021-02-12 LAB — CORTISOL: Cortisol, Plasma: 0.8 ug/dL — ABNORMAL LOW

## 2021-02-12 LAB — CK: Total CK: 137 U/L (ref 29–143)

## 2021-02-12 LAB — SEDIMENTATION RATE: Sed Rate: 17 mm/h (ref 0–30)

## 2021-02-14 ENCOUNTER — Encounter: Payer: Self-pay | Admitting: Family Medicine

## 2021-02-14 DIAGNOSIS — R220 Localized swelling, mass and lump, head: Secondary | ICD-10-CM

## 2021-02-14 DIAGNOSIS — R7989 Other specified abnormal findings of blood chemistry: Secondary | ICD-10-CM

## 2021-02-14 NOTE — Progress Notes (Signed)
HI Carol Diaz,  Your x-ray shows no acute findings such as fracture etc.  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  No significant disc space narrowing which is actually really reassuring.  The like to continue to possibly move forward with an MRI of the lumbar spine if you are okay with that.

## 2021-02-14 NOTE — Telephone Encounter (Signed)
Endocrinology referral pended.  Need diagnosis for reason for referral.  Tiajuana Amass, CMA

## 2021-02-14 NOTE — Progress Notes (Signed)
Hi Carol Diaz, blood count looks normal.  Muscle enzyme level is okay within the normal range.  Inflammatory markers normal.  Overall your metabolic panel looks great except 1 your liver enzymes is still mildly elevated similar to the last time.  Your cortisol level was low.  It was normal last year but it was low this time.  I would like to refer you to endocrinology.  Would you be okay with that?

## 2021-02-14 NOTE — Telephone Encounter (Signed)
Referral signed.  See if she is having any urinary symptoms I think the way she is describing it may be in the kidney area?  She does get frequent UTIs.

## 2021-02-15 NOTE — Telephone Encounter (Signed)
Per Dr. Linford Arnold, pt to be seen at 2pm.  Pt informed.  Carol Diaz, CMA

## 2021-02-16 ENCOUNTER — Other Ambulatory Visit: Payer: Self-pay

## 2021-02-16 ENCOUNTER — Ambulatory Visit (INDEPENDENT_AMBULATORY_CARE_PROVIDER_SITE_OTHER): Payer: BC Managed Care – PPO

## 2021-02-16 ENCOUNTER — Encounter: Payer: Self-pay | Admitting: Family Medicine

## 2021-02-16 ENCOUNTER — Ambulatory Visit: Payer: BC Managed Care – PPO | Admitting: Family Medicine

## 2021-02-16 VITALS — BP 113/71 | HR 90 | Ht 66.0 in | Wt 163.0 lb

## 2021-02-16 DIAGNOSIS — M545 Low back pain, unspecified: Secondary | ICD-10-CM | POA: Diagnosis not present

## 2021-02-16 DIAGNOSIS — R0781 Pleurodynia: Secondary | ICD-10-CM | POA: Diagnosis not present

## 2021-02-16 DIAGNOSIS — R1012 Left upper quadrant pain: Secondary | ICD-10-CM | POA: Diagnosis not present

## 2021-02-16 MED ORDER — TRAMADOL HCL 50 MG PO TABS: 50.0000 mg | ORAL_TABLET | Freq: Four times a day (QID) | ORAL | 0 refills | Status: AC | PRN

## 2021-02-16 NOTE — Progress Notes (Signed)
ri  Acute Office Visit  Subjective:    Patient ID: Carol Diaz, female    DOB: [birth year], 55 y.o.   MRN: [id number]  Chief Complaint  Patient presents with   L sided pain    HPI Patient is in today for rib pain on the left side.  She says initially it started about 3 weeks ago just a little bit more posterior lateral over that mid back to lower rib area.  She thought initially she just pulled a muscle she was just using Tylenol as needed, no other treatments.  But it has gradually been getting worse instead of actually getting better it is now radiating into the left lower rib area and into the left upper quadrant.  She has felt a little bit nauseated with it but no vomiting she did have some diarrhea last night.  No blood in the stools.  No dysuria or hematuria.  No fever or chills.  It is painful to lay on that side and its more painful to take a deep breath.  No recent trauma or injury to the area.  She denies any bruising swelling or rash in that area.  Past Medical History:  Diagnosis Date   Chronic fatigue syndrome    Hypertension    Migraines    Palpitations    Renal insufficiency    Sjogren's disease ([place]) 03/17/2020    Past Surgical History:  Procedure Laterality Date   CESAREAN SECTION     ivp     LAPAROSCOPIC APPENDECTOMY N/A 05/17/2017   Procedure: DIAGNOSTIC LAPAROSCOPY APPENDECTOMY;  Surgeon: Leighton Ruff, MD;  Location: [facility];  Service: General;  Laterality: N/A;   TUBAL LIGATION      Family History  Problem Relation Age of Onset   Emphysema Mother    Hypertension Father    Heart disease Brother 88       mi    Social History   Socioeconomic History   Marital status: Married    Spouse name: Not on file   Number of children: 2   Years of education: Not on file   Highest education level: Not on file  Occupational History    Employer: [employer]  Tobacco Use   Smoking status: Never   Smokeless tobacco: Never  Substance and Sexual  Activity   Alcohol use: No   Drug use: No   Sexual activity: Yes    Partners: Male    Birth control/protection: Pill  Other Topics Concern   Not on file  Social History Narrative   Some exercise.    Social Determinants of Health   Financial Resource Strain: Not on file  Food Insecurity: Not on file  Transportation Needs: Not on file  Physical Activity: Not on file  Stress: Not on file  Social Connections: Not on file  Intimate Partner Violence: Not on file    Outpatient Medications Prior to Visit  Medication Sig Dispense Refill   cetirizine (ZYRTEC) 10 MG tablet TK 1 T PO Q 24 H     cyanocobalamin (,VITAMIN B-12,) 1000 MCG/ML injection every 30 (thirty) days.     cyclobenzaprine (FLEXERIL) 10 MG tablet Take 1 tablet (10 mg total) by mouth at bedtime as needed for muscle spasms. 90 tablet 1   DULoxetine (CYMBALTA) 60 MG capsule TAKE 1 CAPSULE(60 MG) BY MOUTH DAILY 90 capsule 1   fluticasone (FLONASE) 50 MCG/ACT nasal spray Place 2 sprays into both nostrils daily. 16 g 0   folic acid (FOLVITE)  1 MG tablet Take 1 mg by mouth daily.     hydrochlorothiazide (HYDRODIURIL) 12.5 MG tablet Take 12.5 mg by mouth every morning.     hydroxychloroquine (PLAQUENIL) 200 MG tablet Take 200 mg by mouth 2 (two) times daily.     LINZESS 290 MCG CAPS capsule TAKE 1 CAPSULE(290 MCG) BY MOUTH DAILY BEFORE BREAKFAST 90 capsule 3   magic mouthwash SOLN SWISH AND SPIT 15MLS BY MOUTH THREE TIMES DAILY AS NEEDED     omeprazole (PRILOSEC) 40 MG capsule Take 1 capsule by mouth daily.     ondansetron (ZOFRAN-ODT) 4 MG disintegrating tablet DISSOLVE ONE TABLET ON THE TONGUE EVERY 8 HOURS AS NEEDED FOR NAUSEA OR VOMITING 20 tablet 0   primidone (MYSOLINE) 50 MG tablet Take 100 mg by mouth 2 (two) times daily.     propranolol ER (INDERAL LA) 60 MG 24 hr capsule Take 1 capsule (60 mg total) by mouth daily. 90 capsule 1   SUMAtriptan (IMITREX) 100 MG tablet TAKE 1 TABLET BY MOUTH EVERY 2 HOURS AS NEEDED MIGRAINES  AS DIRECTED 10 tablet 11   Thiamine HCl (B-1) 100 MG TABS Take 1 tablet by mouth 2 (two) times daily.     topiramate (TOPAMAX) 50 MG tablet Take 50 mg by mouth 2 (two) times daily.     traMADol (ULTRAM) 50 MG tablet Take 50 mg by mouth 3 (three) times daily as needed for moderate pain.     Vitamin D, Ergocalciferol, (DRISDOL) 1.25 MG (50000 UNIT) CAPS capsule Take 50,000 Units by mouth once a week.     No facility-administered medications prior to visit.    Allergies  Allergen Reactions   Fluoxetine Other (See Comments)    Made her tremor worse    Lexapro [Escitalopram Oxalate] Other (See Comments)    Headache   Morphine Nausea And Vomiting   Sertraline Other (See Comments)    Headache     Review of Systems     Objective:    Physical Exam Vitals and nursing note reviewed.  Constitutional:      Appearance: She is well-developed.  HENT:     Head: Normocephalic and atraumatic.  Cardiovascular:     Rate and Rhythm: Normal rate and regular rhythm.     Heart sounds: Normal heart sounds.  Pulmonary:     Effort: Pulmonary effort is normal.     Breath sounds: Normal breath sounds.  Chest:       Comments: Area of tenderness.  Also tender in the left upper quadrant.  Nontender around the epigastric area. Skin:    General: Skin is warm and dry.  Neurological:     Mental Status: She is alert and oriented to person, place, and time.  Psychiatric:        Behavior: Behavior normal.    BP 113/71   Pulse 90   Ht '5\' 6"'  (1.676 m)   Wt 163 lb (73.9 kg)   LMP 04/22/2018 (Exact Date)   SpO2 100%   BMI 26.31 kg/m  Wt Readings from Last 3 Encounters:  02/16/21 163 lb (73.9 kg)  02/11/21 163 lb (73.9 kg)  02/03/21 150 lb (68 kg)    There are no preventive care reminders to display for this patient.  There are no preventive care reminders to display for this patient.   Lab Results  Component Value Date   TSH 0.84 12/06/2020   Lab Results  Component Value Date   WBC  9.1 02/11/2021   HGB 13.0 02/11/2021  HCT 38.9 02/11/2021   MCV 89.8 02/11/2021   PLT 395 02/11/2021   Lab Results  Component Value Date   NA 136 02/11/2021   K 4.3 02/11/2021   CO2 29 02/11/2021   GLUCOSE 76 02/11/2021   BUN 24 02/11/2021   CREATININE 0.87 02/11/2021   BILITOT 0.3 02/11/2021   ALKPHOS 51 09/24/2015   AST 30 02/11/2021   ALT 44 (H) 02/11/2021   PROT 6.2 02/11/2021   ALBUMIN 3.9 09/24/2015   CALCIUM 9.5 02/11/2021   EGFR 78 12/06/2020   Lab Results  Component Value Date   CHOL 233 (H) 12/06/2020   Lab Results  Component Value Date   HDL 81 12/06/2020   Lab Results  Component Value Date   LDLCALC 127 (H) 12/06/2020   Lab Results  Component Value Date   TRIG 132 12/06/2020   Lab Results  Component Value Date   CHOLHDL 2.9 12/06/2020   Lab Results  Component Value Date   HGBA1C 5.5 12/06/2020       Assessment & Plan:   Problem List Items Addressed This Visit   None Visit Diagnoses     LUQ pain    -  Primary   Relevant Orders   Lipase   CBC with Differential/Platelet   Rib pain on left side       Relevant Orders   DG Ribs Unilateral Left   Acute left-sided low back pain without sciatica       Relevant Medications   traMADol (ULTRAM) 50 MG tablet      She has tenderness directly over the left ribs a little bit anteriorly going posteriorly.  So I do want to get an x-ray just to rule out fractures since it is worse with taking a deep breath and laying on her side.  But she also has a little bit of tenderness in that left upper quadrant as well as some nausea so I do think checking for pancreatitis and checking to see if her white blood cell count is elevated would be helpful as well.  We will do lobe labs and imaging and will call with results once available  Meds ordered this encounter  Medications   traMADol (ULTRAM) 50 MG tablet    Sig: Take 1 tablet (50 mg total) by mouth every 6 (six) hours as needed for up to 5 days for moderate  pain or severe pain. Maximum 6 tabs per day.    Dispense:  20 tablet    Refill:  0      Beatrice Lecher, MD

## 2021-02-17 LAB — CBC WITH DIFFERENTIAL/PLATELET
Absolute Monocytes: 832 cells/uL (ref 200–950)
Basophils Absolute: 32 cells/uL (ref 0–200)
Basophils Relative: 0.4 %
Eosinophils Absolute: 64 cells/uL (ref 15–500)
Eosinophils Relative: 0.8 %
HCT: 41 % (ref 35.0–45.0)
Hemoglobin: 13.8 g/dL (ref 11.7–15.5)
Lymphs Abs: 2240 cells/uL (ref 850–3900)
MCH: 30.3 pg (ref 27.0–33.0)
MCHC: 33.7 g/dL (ref 32.0–36.0)
MCV: 89.9 fL (ref 80.0–100.0)
MPV: 9.5 fL (ref 7.5–12.5)
Monocytes Relative: 10.4 %
Neutro Abs: 4832 cells/uL (ref 1500–7800)
Neutrophils Relative %: 60.4 %
Platelets: 362 10*3/uL (ref 140–400)
RBC: 4.56 10*6/uL (ref 3.80–5.10)
RDW: 14.2 % (ref 11.0–15.0)
Total Lymphocyte: 28 %
WBC: 8 10*3/uL (ref 3.8–10.8)

## 2021-02-17 LAB — LIPASE: Lipase: 32 U/L (ref 7–60)

## 2021-02-18 ENCOUNTER — Encounter: Payer: Self-pay | Admitting: Family Medicine

## 2021-02-18 DIAGNOSIS — R1012 Left upper quadrant pain: Secondary | ICD-10-CM

## 2021-02-18 DIAGNOSIS — R0781 Pleurodynia: Secondary | ICD-10-CM

## 2021-02-21 ENCOUNTER — Ambulatory Visit: Payer: BC Managed Care – PPO | Admitting: Rheumatology

## 2021-02-21 NOTE — Progress Notes (Signed)
Hi Alaura, lipase was normal so no sign of pancreatitis.  Blood count looks great no sign of infection.

## 2021-02-21 NOTE — Progress Notes (Signed)
Hi Iyana, the x-ray looks good no sign of rib fracture.  The neck step would be to consider a CAT scan of your abdomen it would also get the lower chest and the bottom of your lungs as well so I think it would cover the area that were concerned about.  And that the last time you had a CT of the abdomen was May 2021.  If you are willing to do that then let me know and I can place an order.

## 2021-02-24 ENCOUNTER — Other Ambulatory Visit: Payer: Self-pay

## 2021-02-24 ENCOUNTER — Ambulatory Visit (INDEPENDENT_AMBULATORY_CARE_PROVIDER_SITE_OTHER): Payer: BC Managed Care – PPO

## 2021-02-24 ENCOUNTER — Encounter: Payer: Self-pay | Admitting: Family Medicine

## 2021-02-24 ENCOUNTER — Ambulatory Visit: Payer: BC Managed Care – PPO | Admitting: Family Medicine

## 2021-02-24 VITALS — BP 117/73 | HR 92 | Temp 97.5°F | Ht 66.0 in | Wt 165.0 lb

## 2021-02-24 DIAGNOSIS — R110 Nausea: Secondary | ICD-10-CM | POA: Diagnosis not present

## 2021-02-24 DIAGNOSIS — R1012 Left upper quadrant pain: Secondary | ICD-10-CM | POA: Diagnosis not present

## 2021-02-24 DIAGNOSIS — L03115 Cellulitis of right lower limb: Secondary | ICD-10-CM

## 2021-02-24 DIAGNOSIS — R0781 Pleurodynia: Secondary | ICD-10-CM

## 2021-02-24 MED ORDER — IOHEXOL 300 MG/ML  SOLN
100.0000 mL | Freq: Once | INTRAMUSCULAR | Status: AC | PRN
  Administered 2021-02-24: 100 mL via INTRAVENOUS

## 2021-02-24 MED ORDER — DOXYCYCLINE HYCLATE 100 MG PO TABS: 100.0000 mg | ORAL_TABLET | Freq: Two times a day (BID) | ORAL | 0 refills | Status: AC

## 2021-02-24 NOTE — Progress Notes (Signed)
Acute Office Visit  Subjective:    Patient ID: Carol Diaz, female    DOB: 1965/11/02, 55 y.o.   MRN: [id number]  Chief Complaint  Patient presents with   Leg Pain    Right     Leg Pain   Patient is in today for leg concern.    About 2 days ago, patient noticed some anterior right lower leg pain.She thought maybe she had bumped into something and it was a bruise but we she looked at it she noticed it was not bruised but was slightly red, warm, mildly swollen. States the area is very sensitive, tender to touch. Tends to be more painful and more swollen if she is on her feet for long periods of time. 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 She denies any injuries, cuts, blisters, other rashes or pain, fevers, body aches.    Past Medical History:  Diagnosis Date   Chronic fatigue syndrome    Hypertension    Migraines    Palpitations    Renal insufficiency    Sjogren's disease ([place]) 03/17/2020    Past Surgical History:  Procedure Laterality Date   CESAREAN SECTION     ivp     LAPAROSCOPIC APPENDECTOMY N/A 05/17/2017   Procedure: DIAGNOSTIC LAPAROSCOPY APPENDECTOMY;  Surgeon: Leighton Ruff, MD;  Location: [facility];  Service: General;  Laterality: N/A;   TUBAL LIGATION      Family History  Problem Relation Age of Onset   Emphysema Mother    Hypertension Father    Heart disease Brother 79       mi    Social History   Socioeconomic History   Marital status: Married    Spouse name: Not on file   Number of children: 2   Years of education: Not on file   Highest education level: Not on file  Occupational History    Employer: [employer]  Tobacco Use   Smoking status: Never   Smokeless tobacco: Never  Substance and Sexual Activity   Alcohol use: No    Drug use: No   Sexual activity: Yes    Partners: Male    Birth control/protection: Pill  Other Topics Concern   Not on file  Social History Narrative   Some exercise.    Social Determinants of Health   Financial Resource Strain: Not on file  Food Insecurity: Not on file  Transportation Needs: Not on file  Physical Activity: Not on file  Stress: Not on file  Social Connections: Not on file  Intimate Partner Violence: Not on file    Outpatient Medications Prior to Visit  Medication Sig Dispense Refill   cetirizine (ZYRTEC) 10 MG tablet TK 1 T PO Q 24 H     cyanocobalamin (,VITAMIN B-12,) 1000 MCG/ML injection every 30 (thirty) days.     cyclobenzaprine (FLEXERIL) 10 MG tablet Take 1 tablet (10 mg total) by mouth at bedtime as needed for muscle spasms. 90 tablet 1   DULoxetine (CYMBALTA) 60 MG capsule TAKE 1 CAPSULE(60 MG) BY MOUTH DAILY 90 capsule 1   fluticasone (FLONASE) 50 MCG/ACT nasal spray Place 2 sprays into both nostrils daily. 16 g 0   folic acid (FOLVITE) 1 MG tablet Take 1 mg by mouth daily.  hydrochlorothiazide (HYDRODIURIL) 12.5 MG tablet Take 12.5 mg by mouth every morning.     hydroxychloroquine (PLAQUENIL) 200 MG tablet Take 200 mg by mouth 2 (two) times daily.     LINZESS 290 MCG CAPS capsule TAKE 1 CAPSULE(290 MCG) BY MOUTH DAILY BEFORE BREAKFAST 90 capsule 3   magic mouthwash SOLN SWISH AND SPIT 15MLS BY MOUTH THREE TIMES DAILY AS NEEDED     omeprazole (PRILOSEC) 40 MG capsule Take 1 capsule by mouth daily.     ondansetron (ZOFRAN-ODT) 4 MG disintegrating tablet DISSOLVE ONE TABLET ON THE TONGUE EVERY 8 HOURS AS NEEDED FOR NAUSEA OR VOMITING 20 tablet 0   primidone (MYSOLINE) 50 MG tablet Take 100 mg by mouth 2 (two) times daily.     propranolol ER (INDERAL LA) 60 MG 24 hr capsule Take 1 capsule (60 mg total) by mouth daily. 90 capsule 1   SUMAtriptan (IMITREX) 100 MG tablet TAKE 1 TABLET BY MOUTH EVERY 2 HOURS AS NEEDED MIGRAINES AS DIRECTED 10 tablet 11    Thiamine HCl (B-1) 100 MG TABS Take 1 tablet by mouth 2 (two) times daily.     topiramate (TOPAMAX) 50 MG tablet Take 50 mg by mouth 2 (two) times daily.     traMADol (ULTRAM) 50 MG tablet Take 50 mg by mouth 3 (three) times daily as needed for moderate pain.     Vitamin D, Ergocalciferol, (DRISDOL) 1.25 MG (50000 UNIT) CAPS capsule Take 50,000 Units by mouth once a week.     No facility-administered medications prior to visit.    Allergies  Allergen Reactions   Fluoxetine Other (See Comments)    Made her tremor worse    Lexapro [Escitalopram Oxalate] Other (See Comments)    Headache   Morphine Nausea And Vomiting   Sertraline Other (See Comments)    Headache     Review of Systems All review of systems negative except what is listed in the HPI     Objective:    Physical Exam Vitals reviewed.  Constitutional:      Appearance: Normal appearance.  Cardiovascular:     Pulses: Normal pulses.  Skin:    Capillary Refill: Capillary refill takes less than 2 seconds.     Findings: Erythema present.     Comments: Anterior right lower leg with approximately 3 in x 4 in are of mild erythema, warmth, edema, tenderness to light palpation; no lacerations, weeping, drainage; normal lower extremity pulses, capillary refill; negative Homan's sign   Neurological:     General: No focal deficit present.     Mental Status: She is alert and oriented to person, place, and time. Mental status is at baseline.     Sensory: No sensory deficit.     Motor: No weakness.     Gait: Gait normal.  Psychiatric:        Mood and Affect: Mood normal.        Behavior: Behavior normal.        Thought Content: Thought content normal.        Judgment: Judgment normal.    LMP 04/22/2018 (Exact Date)  Wt Readings from Last 3 Encounters:  02/16/21 163 lb (73.9 kg)  02/11/21 163 lb (73.9 kg)  02/03/21 150 lb (68 kg)    There are no preventive care reminders to display for this patient.  There are no  preventive care reminders to display for this patient.   Lab Results  Component Value Date   TSH 0.84 12/06/2020   Lab  Results  Component Value Date   WBC 8.0 02/16/2021   HGB 13.8 02/16/2021   HCT 41.0 02/16/2021   MCV 89.9 02/16/2021   PLT 362 02/16/2021   Lab Results  Component Value Date   NA 136 02/11/2021   K 4.3 02/11/2021   CO2 29 02/11/2021   GLUCOSE 76 02/11/2021   BUN 24 02/11/2021   CREATININE 0.87 02/11/2021   BILITOT 0.3 02/11/2021   ALKPHOS 51 09/24/2015   AST 30 02/11/2021   ALT 44 (H) 02/11/2021   PROT 6.2 02/11/2021   ALBUMIN 3.9 09/24/2015   CALCIUM 9.5 02/11/2021   EGFR 78 12/06/2020   Lab Results  Component Value Date   CHOL 233 (H) 12/06/2020   Lab Results  Component Value Date   HDL 81 12/06/2020   Lab Results  Component Value Date   LDLCALC 127 (H) 12/06/2020   Lab Results  Component Value Date   TRIG 132 12/06/2020   Lab Results  Component Value Date   CHOLHDL 2.9 12/06/2020   Lab Results  Component Value Date   HGBA1C 5.5 12/06/2020       Assessment & Plan:   1. Cellulitis of right lower extremity Uncertain etiology, but presentation mostly consistent with cellulitis. Will try treating with doxycyline. Encourage elevation of extremity and monitoring for any worsening of symptoms. Normal pulses and capillary refill. Recommend follow-up in 10-14 days if not improving. Patient aware of signs/symptoms requiring further/urgent evaluation.  - doxycycline (VIBRA-TABS) 100 MG tablet; Take 1 tablet (100 mg total) by mouth 2 (two) times daily for 7 days.  Dispense: 14 tablet; Refill: 0  Follow-up if symptoms worsen or fail to improve.   [facility] Nails Olevia Bowens, DNP, FNP-C

## 2021-02-24 NOTE — Patient Instructions (Signed)
Let's treat this like cellulitis and see if it will clear up with some antibiotics.  When you get home, draw a line around the area of redness so we can track if it is getting larger or smaller.   Follow-up if not improving in 10-14 days or sooner if symptoms become worse.

## 2021-02-25 NOTE — Progress Notes (Signed)
Hi [facility], the good news is that the CT did not show [place] anything worrisome or anything that would need intervention or surgery.  They did see a very large stool burden.  So I really think this is causing a lot of your pain.  The bowel curve is right underneath your rib cage on each side and sometimes that is where things will get hung up and have a hard time passing.  I would recommend getting on MiraLAX and doing 1 capful twice a day until you have a soft bowel movement and then decreasing down to 1 Nightly for about 10 days after that to just really do a good cleanout.  You might even want to consider doing half of a capful of MiraLAX regularly for maintenance.  If you are already using MiraLAX and not getting good results then you can also try magnesium citrate which is found over-the-counter it can help with a cleanout but cannot be used more long-term for maintenance.

## 2021-02-25 NOTE — Progress Notes (Signed)
Office Visit Note  Patient: Carol Diaz             Date of Birth: 07-06-1965           MRN: [id number]             PCP: Hali Marry, MD Referring: Hali Marry, * Visit Date: 03/10/2021 Occupation: '@GUAROCC' @  Subjective:  Fatigue and joint pain.   History of Present Illness: Carol Diaz is a 55 y.o. female seen in consultation per request of her PCP.  According the patient she was diagnosed with fibromyalgia and chronic fatigue syndrome about 20 years ago.  She has been under care of Dr. Madilyn Fireman.  She states in 2020 she started experiencing increased joint pain and fatigue.  She had extensive labs and x-rays by her PCP and then she was referred to Dr. Tanna Furry (rheumatologist in [place]).  He diagnosed her with Sjogren's syndrome.  She was started on methotrexate in 2021 and prednisone.  She could not come off prednisone until recently due to ongoing pain and discomfort in her joints.  She states she noted some improvement in her joint symptoms but she had a lot of side effects from methotrexate including nausea fatigue and oral ulcers.  She discontinued methotrexate.  She was switched to hydroxychloroquine in June 2022.  She has not noticed much improvement in her symptoms on hydroxychloroquine but she has less side effects.  She continues to have pain and discomfort in her joints which she describes in her hands, lower back and her neck.  She also has discomfort in her hands and intermittent swelling.  She states she is very tired and has weakness in her legs.  She has difficulty climbing stairs.  She has tried physical therapy in 2019 which made her symptoms worse.  She has been going to a neurologist on a regular basis due to degenerative disease of the lumbar spine and spinal stenosis.  She gets epidural injections as needed.  She also suffers from headaches, brain fog.  She has gained some weight.  There is family history of rheumatoid arthritis and her sister was  being treated with methotrexate and hydroxychloroquine.  Activities of Daily Living:  Patient reports morning stiffness for 1-2 hours.   Patient Reports nocturnal pain.  Difficulty dressing/grooming: Reports Difficulty climbing stairs: Reports Difficulty getting out of chair: Reports Difficulty using hands for taps, buttons, cutlery, and/or writing: Reports  Review of Systems  Constitutional:  Positive for fatigue. Negative for night sweats, weight gain and weight loss.  HENT:  Positive for mouth dryness and nose dryness. Negative for mouth sores, trouble swallowing and trouble swallowing.   Eyes:  Positive for dryness. Negative for pain, redness, itching and visual disturbance.  Respiratory:  Negative for cough, shortness of breath and difficulty breathing.   Cardiovascular:  Negative for chest pain, palpitations, hypertension, irregular heartbeat and swelling in legs/feet.  Gastrointestinal:  Positive for constipation. Negative for blood in stool and diarrhea.  Endocrine: Positive for increased urination.  Genitourinary:  Positive for difficulty urinating. Negative for vaginal dryness.  Musculoskeletal:  Positive for joint pain, joint pain, joint swelling, myalgias, morning stiffness, muscle tenderness and myalgias. Negative for muscle weakness.  Skin:  Positive for rash. Negative for color change, hair loss, skin tightness, ulcers and sensitivity to sunlight.  Allergic/Immunologic: Positive for susceptible to infections.  Neurological:  Positive for dizziness, numbness, headaches, memory loss and weakness. Negative for night sweats.  Hematological:  Positive for bruising/bleeding tendency. Negative  for swollen glands.  Psychiatric/Behavioral:  Positive for depressed mood and sleep disturbance. The patient is nervous/anxious.    PMFS History:  Patient Active Problem List   Diagnosis Date Noted   GERD (gastroesophageal reflux disease) 11/25/2020   Renal insufficiency    Hypertension     Chronic fatigue syndrome    Sjogren's disease ([place]) 03/17/2020   Thiamin deficiency 09/30/2019   Localized swelling of both lower extremities 08/01/2019   Blue toes 08/01/2019   Tachycardia 06/13/2019   Abnormal ankle brachial index (ABI) 10/07/2018   Fibromyalgia 07/17/2016   CKD (chronic kidney disease) stage 2, GFR 60-89 ml/min 09/27/2015   Neck pain 06/10/2015   Obesity 09/25/2013   Essential tremor 02/02/2012   Hyperlipidemia 12/15/2011   GROSS HEMATURIA 04/06/2010   Iron deficiency anemia 09/22/2009   PALPITATIONS 09/22/2009   FATIGUE 08/31/2009   HYPOGLYCEMIA, UNSPECIFIED 11/20/2008   MENORRHAGIA 09/05/2007   DEPRESSION, MAJOR, RECURRENT 01/02/2006   Migraine headache 01/02/2006   IRRITABLE BOWEL SYNDROME 01/02/2006   INSOMNIA NOS 01/02/2006    Past Medical History:  Diagnosis Date   Chronic fatigue syndrome    Hypertension    Migraines    Palpitations    Renal insufficiency    Sjogren's disease ([place]) 03/17/2020    Family History  Problem Relation Age of Onset   Emphysema Mother    Hypertension Father    Rheum arthritis Sister    Thyroid disease Sister    Thyroid disease Sister    Diabetes Sister    Heart disease Brother 41       mi   Thyroid disease Brother    Epilepsy Daughter    Healthy Daughter    Healthy Daughter    Asthma Daughter    Past Surgical History:  Procedure Laterality Date   CESAREAN SECTION     ivp     LAPAROSCOPIC APPENDECTOMY N/A 05/17/2017   Procedure: DIAGNOSTIC LAPAROSCOPY APPENDECTOMY;  Surgeon: Leighton Ruff, MD;  Location: [facility];  Service: General;  Laterality: N/A;   TUBAL LIGATION     Social History   Social History Narrative   Some exercise.    Immunization History  Administered Date(s) Administered   Influenza Split 12/08/2011   Influenza Whole 01/25/2005, 01/14/2010   Influenza,inj,Quad PF,6+ Mos 01/07/2014, 12/11/2014, 12/24/2015, 02/12/2017, 11/15/2017, 11/25/2020   Influenza-Unspecified 12/25/2012    PFIZER(Purple Top)SARS-COV-2 Vaccination 05/24/2019   Td 03/15/2010   Tdap 11/25/2020     Objective: Vital Signs: BP 113/78 (BP Location: Right Arm, Patient Position: Sitting, Cuff Size: Normal)   Pulse 91   Ht '5\' 6"'  (1.676 m)   Wt 169 lb 6.4 oz (76.8 kg)   LMP 04/22/2018 (Exact Date)   BMI 27.34 kg/m    Physical Exam Vitals and nursing note reviewed.  Constitutional:      Appearance: She is well-developed.  HENT:     Head: Normocephalic and atraumatic.  Eyes:     Conjunctiva/sclera: Conjunctivae normal.  Cardiovascular:     Rate and Rhythm: Normal rate and regular rhythm.     Heart sounds: Normal heart sounds.  Pulmonary:     Effort: Pulmonary effort is normal.     Breath sounds: Normal breath sounds.  Abdominal:     General: Bowel sounds are normal.     Palpations: Abdomen is soft.  Musculoskeletal:     Cervical back: Normal range of motion.  Lymphadenopathy:     Cervical: No cervical adenopathy.  Skin:    General: Skin is warm and dry.  Capillary Refill: Capillary refill takes less than 2 seconds.     Comments: Telangiectasias were noted on her face.  She had decreased oral aperture.  She had a skin tightness on her lower extremities.  No sclerodactyly on her hands was noted.  She had good capillary refill in her hands.  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  Neurological:     Mental Status: She is alert and oriented to person, place, and time.  Psychiatric:        Behavior: Behavior normal.     Musculoskeletal Exam: C-spine was in good range of motion.  Shoulder joints, elbow joints, wrist joints, MCPs PIPs and DIPs with good range of motion.  She had bilateral PIP and DIP thickening with no synovitis.  Hip joints, knee joints, ankles, MTPs and PIPs with good range of motion with no synovitis.  She generalized hyperalgesia and positive tender points.  CDAI Exam: CDAI Score: -- Patient Global: --; Provider Global: -- Swollen: --; Tender:  -- Joint Exam 03/10/2021   No joint exam has been documented for this visit   There is currently no information documented on the homunculus. Go to the Rheumatology activity and complete the homunculus joint exam.  Investigation: No additional findings.  Imaging: DG Ribs Unilateral Left  Result Date: 02/16/2021 CLINICAL DATA:  Left lower rib pain and tenderness for 3 weeks EXAM: LEFT RIBS - 2 VIEW COMPARISON:  06/24/2019 FINDINGS: Frontal view of the chest as well as frontal and oblique views of the left thoracic cage are obtained. Cardiac silhouette is unremarkable. No airspace disease, effusion, or pneumothorax. There are no acute displaced fractures. IMPRESSION: 1. No acute intrathoracic process. Electronically Signed   By: Randa Ngo M.D.   On: 02/16/2021 21:43   DG Lumbar Spine Complete  Result Date: 02/12/2021 CLINICAL DATA:  Bilateral lower extremity weakness and pain left worse than right. EXAM: LUMBAR SPINE - COMPLETE 4+ VIEW COMPARISON:  None. FINDINGS: Vertebral body alignment and heights are normal. There is mild spondylosis of the lumbar spine to include moderate facet arthropathy over the mid to lower lumbar spine. No significant disc space narrowing. No evidence of compression fracture or spondylolisthesis/spondylolysis. IMPRESSION: 1. No acute findings. 2. Mild spondylosis of the lumbar spine. Electronically Signed   By: Marin Olp M.D.   On: 02/12/2021 08:49   CT Abdomen Pelvis W Contrast  Result Date: 02/24/2021 CLINICAL DATA:  Left upper quadrant pain and nausea for 3 weeks. Previous appendectomy. EXAM: CT ABDOMEN AND PELVIS WITH CONTRAST TECHNIQUE: Multidetector CT imaging of the abdomen and pelvis was performed using the standard protocol following bolus administration of intravenous contrast. CONTRAST:  14m OMNIPAQUE IOHEXOL 300 MG/ML  SOLN COMPARISON:  08/08/2019 FINDINGS: Lower Chest: No acute findings. Hepatobiliary: No hepatic masses identified. Gallbladder  is unremarkable. No evidence of biliary ductal dilatation. Pancreas:  No mass or inflammatory changes. Spleen: Within normal limits in size and appearance. Adrenals/Urinary Tract: No masses identified. No evidence of ureteral calculi or hydronephrosis. Stomach/Bowel: No evidence of obstruction, inflammatory process or abnormal fluid collections. Large colonic stool burden is again seen, similar to prior exam. Vascular/Lymphatic: No pathologically enlarged lymph nodes. No acute vascular findings. Reproductive: Uterus has decreased in size since previous study. No mass or other significant abnormality. Other: A tiny umbilical hernia is again seen which contains only fat. Musculoskeletal:  No suspicious bone lesions identified. IMPRESSION: No acute findings within the abdomen or pelvis. Stable tiny umbilical hernia, which contains only fat. Stable large stool  burden noted; recommend clinical correlation for possible constipation. Electronically Signed   By: Marlaine Hind M.D.   On: 02/24/2021 16:57    Recent Labs: Lab Results  Component Value Date   WBC 8.0 02/16/2021   HGB 13.8 02/16/2021   PLT 362 02/16/2021   NA 136 02/11/2021   K 4.3 02/11/2021   CL 102 02/11/2021   CO2 29 02/11/2021   GLUCOSE 76 02/11/2021   BUN 24 02/11/2021   CREATININE 0.87 02/11/2021   BILITOT 0.3 02/11/2021   ALKPHOS 51 09/24/2015   AST 30 02/11/2021   ALT 44 (H) 02/11/2021   PROT 6.2 02/11/2021   ALBUMIN 3.9 09/24/2015   CALCIUM 9.5 02/11/2021   GFRAA 81 07/19/2020    Speciality Comments: No specialty comments available.  Procedures:  No procedures performed Allergies: Fluoxetine, Lexapro [escitalopram oxalate], Morphine, and Sertraline   Assessment / Plan:     Visit Diagnoses: Polyarthralgia - 02/11/21: ESR 17, cortisol 0.8, CK 137. 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  She gives history of dry mouth dry eyes and fatigue for many  years.  She was diagnosed with Sjogren's syndrome by Dr. Jerene Pitch, rheumatologist at [facility].  She was diagnosed with Sjogren's syndrome and was placed on methotrexate and prednisone.  She had a lot of side effects from methotrexate including fatigue and oral ulcers.  Methotrexate was discontinued after several months.  Prednisone could not be tapered.  She was started on hydroxychloroquine in June 2022.  She has not noticed any improvement on hydroxychloroquine.  She complains of pain and discomfort in multiple joints.  She gives history of swelling in her hands.  She gives history of dry mouth and dry eyes.  There is history of Raynaud's in her feet.  Plan: Urinalysis, Routine w reflex microscopic, Sedimentation rate  Pain in both hands -she complains of pain and discomfort in her bilateral hands.  No synovitis was noted.  Plan: XR Hand 2 View Right, XR Hand 2 View Left.  X-rays of bilateral hands showed generalized osteopenia and osteoarthritis.  Raynaud's syndrome without gangrene -she gives history of Raynauds in her feet.  No capillary changes in her hands was noted.  No sclerodactyly was noted in her hands.  Skin tightness was noted on her bilateral lower extremities.  She had decreased oral aperture.  She also had multiple telangiectasias on her face.  I am concerned about possible scleroderma.  I will obtain AVISE labs.  Plan: Lupus Anticoagulant Eval w/Reflex, ANCA Screen Reflex Titer([facility])  Long term systemic steroid user-patient has been on prednisone for almost 2 years.  According to her daughter she just came off prednisone.  She also gets frequent cortisone injection due to lower back pain.  High risk medication use -she was treated with methotrexate initially for almost a year for possible Sjogren's.  Methotrexate was discontinued due to oral ulcers and fatigue.  She has been on hydroxychloroquine for last 6 months.  She has not noticed much improvement on hydroxychloroquine.  Plan:  Hepatitis B surface antigen, Hepatitis C antibody, Hepatitis B core antibody, IgM, QuantiFERON-TB Gold Plus, Serum protein electrophoresis with reflex, IgG, IgA, IgM, Glucose 6 phosphate dehydrogenase  Neck pain-she complains of neck stiffness in the trapezius region.  DDD (degenerative disc disease), lumbar-patient has been followed by neurologist.  According the patient she has been diagnosed with degenerative disc disease and spinal stenosis.  She gets frequent epidural injections.  Fibromyalgia -  she was diagnosed with fibromyalgia several years ago.  She continues to have generalized pain and discomfort.  She has tried physical therapy in the past which made her symptoms worse.  CK was normal most recently.  Other insomnia-she gives history of chronic insomnia.  Chronic fatigue syndrome -she was diagnosed with chronic fatigue syndrome at least 20 years ago.  She continues to have increased fatigue.  Plan: CBC with Differential/Platelet, COMPLETE METABOLIC PANEL WITH GFR, TSH  Primary hypertension-blood pressure was normal today.  CKD (chronic kidney disease) stage 2, GFR 60-89 ml/min-labs from February 11, 2021 showed normal creatinine.  Gastroesophageal reflux disease with esophagitis without hemorrhage  History of IBS  Migraine without aura and without status migrainosus, not intractable  Essential tremor-she had tremors on my examination today.  Blurry vision-followed by neurologist.  Thiamin deficiency  Localized swelling of both lower extremities-she gives history of pedal edema.  No pedal edema was noted.  Anxiety and depression-patient tried throughout the conversation today.  Family history of rheumatoid arthritis - Sister is on methotrexate and hydroxychloroquine.  Orders: Orders Placed This Encounter  Procedures   XR Hand 2 View Right   XR Hand 2 View Left   CBC with Differential/Platelet   COMPLETE METABOLIC PANEL WITH GFR   Urinalysis, Routine w reflex  microscopic   CK   TSH   Sedimentation rate   Hepatitis B surface antigen   Hepatitis C antibody   Hepatitis B core antibody, IgM   QuantiFERON-TB Gold Plus   Serum protein electrophoresis with reflex   IgG, IgA, IgM   Glucose 6 phosphate dehydrogenase   Lupus Anticoagulant Eval w/Reflex   ANCA Screen Reflex Titer([facility])    No orders of the defined types were placed in this encounter.  .  Follow-Up Instructions: Return for Fatigue and joint pain.   Bo Merino, MD  Note - This record has been created using Editor, commissioning.  Chart creation errors have been sought, but may not always  have been located. Such creation errors do not reflect on  the standard of medical care.

## 2021-03-10 ENCOUNTER — Ambulatory Visit: Payer: Self-pay

## 2021-03-10 ENCOUNTER — Other Ambulatory Visit: Payer: Self-pay

## 2021-03-10 ENCOUNTER — Encounter: Payer: Self-pay | Admitting: Rheumatology

## 2021-03-10 ENCOUNTER — Ambulatory Visit: Payer: BC Managed Care – PPO | Admitting: Rheumatology

## 2021-03-10 VITALS — BP 113/78 | HR 91 | Ht 66.0 in | Wt 169.4 lb

## 2021-03-10 DIAGNOSIS — F419 Anxiety disorder, unspecified: Secondary | ICD-10-CM

## 2021-03-10 DIAGNOSIS — G4709 Other insomnia: Secondary | ICD-10-CM

## 2021-03-10 DIAGNOSIS — M5136 Other intervertebral disc degeneration, lumbar region: Secondary | ICD-10-CM

## 2021-03-10 DIAGNOSIS — M3500 Sicca syndrome, unspecified: Secondary | ICD-10-CM

## 2021-03-10 DIAGNOSIS — M2550 Pain in unspecified joint: Secondary | ICD-10-CM | POA: Diagnosis not present

## 2021-03-10 DIAGNOSIS — M79641 Pain in right hand: Secondary | ICD-10-CM

## 2021-03-10 DIAGNOSIS — M79642 Pain in left hand: Secondary | ICD-10-CM

## 2021-03-10 DIAGNOSIS — H538 Other visual disturbances: Secondary | ICD-10-CM

## 2021-03-10 DIAGNOSIS — G43009 Migraine without aura, not intractable, without status migrainosus: Secondary | ICD-10-CM

## 2021-03-10 DIAGNOSIS — F32A Depression, unspecified: Secondary | ICD-10-CM

## 2021-03-10 DIAGNOSIS — R531 Weakness: Secondary | ICD-10-CM

## 2021-03-10 DIAGNOSIS — G9332 Myalgic encephalomyelitis/chronic fatigue syndrome: Secondary | ICD-10-CM

## 2021-03-10 DIAGNOSIS — I7300 Raynaud's syndrome without gangrene: Secondary | ICD-10-CM | POA: Diagnosis not present

## 2021-03-10 DIAGNOSIS — I10 Essential (primary) hypertension: Secondary | ICD-10-CM

## 2021-03-10 DIAGNOSIS — Z8719 Personal history of other diseases of the digestive system: Secondary | ICD-10-CM

## 2021-03-10 DIAGNOSIS — Z8261 Family history of arthritis: Secondary | ICD-10-CM

## 2021-03-10 DIAGNOSIS — M7989 Other specified soft tissue disorders: Secondary | ICD-10-CM

## 2021-03-10 DIAGNOSIS — Z79899 Other long term (current) drug therapy: Secondary | ICD-10-CM

## 2021-03-10 DIAGNOSIS — N289 Disorder of kidney and ureter, unspecified: Secondary | ICD-10-CM

## 2021-03-10 DIAGNOSIS — M797 Fibromyalgia: Secondary | ICD-10-CM

## 2021-03-10 DIAGNOSIS — N182 Chronic kidney disease, stage 2 (mild): Secondary | ICD-10-CM

## 2021-03-10 DIAGNOSIS — Z7952 Long term (current) use of systemic steroids: Secondary | ICD-10-CM

## 2021-03-10 DIAGNOSIS — R5383 Other fatigue: Secondary | ICD-10-CM

## 2021-03-10 DIAGNOSIS — M542 Cervicalgia: Secondary | ICD-10-CM

## 2021-03-10 DIAGNOSIS — K2100 Gastro-esophageal reflux disease with esophagitis, without bleeding: Secondary | ICD-10-CM

## 2021-03-10 DIAGNOSIS — E519 Thiamine deficiency, unspecified: Secondary | ICD-10-CM

## 2021-03-10 DIAGNOSIS — G250 Essential tremor: Secondary | ICD-10-CM

## 2021-03-10 DIAGNOSIS — Z8659 Personal history of other mental and behavioral disorders: Secondary | ICD-10-CM

## 2021-03-14 ENCOUNTER — Encounter: Payer: Self-pay | Admitting: Rheumatology

## 2021-03-16 LAB — IGG, IGA, IGM
IgG (Immunoglobin G), Serum: 838 mg/dL (ref 600–1640)
IgM, Serum: 79 mg/dL (ref 50–300)
Immunoglobulin A: 167 mg/dL (ref 47–310)

## 2021-03-16 LAB — URINALYSIS, ROUTINE W REFLEX MICROSCOPIC
Bilirubin Urine: NEGATIVE
Glucose, UA: NEGATIVE
Hgb urine dipstick: NEGATIVE
Ketones, ur: NEGATIVE
Leukocytes,Ua: NEGATIVE
Nitrite: NEGATIVE
Protein, ur: NEGATIVE
Specific Gravity, Urine: 1.017 (ref 1.001–1.035)
pH: <=5 (ref 5.0–8.0)

## 2021-03-16 LAB — PROTEIN ELECTROPHORESIS, SERUM, WITH REFLEX
Albumin ELP: 3.4 g/dL — ABNORMAL LOW (ref 3.8–4.8)
Alpha 1: 0.2 g/dL (ref 0.2–0.3)
Alpha 2: 0.6 g/dL (ref 0.5–0.9)
Beta 2: 0.3 g/dL (ref 0.2–0.5)
Beta Globulin: 0.5 g/dL (ref 0.4–0.6)
Gamma Globulin: 0.7 g/dL — ABNORMAL LOW (ref 0.8–1.7)
Total Protein: 5.8 g/dL — ABNORMAL LOW (ref 6.1–8.1)

## 2021-03-16 LAB — LUPUS ANTICOAGULANT EVAL W/ REFLEX
PTT-LA Screen: 35 s (ref <=40)
dRVVT: 34 s (ref <=45)

## 2021-03-16 LAB — ANCA SCREEN W REFLEX TITER: ANCA Screen: NEGATIVE

## 2021-03-16 LAB — TSH: TSH: 1.63 mIU/L

## 2021-03-16 LAB — QUANTIFERON-TB GOLD PLUS
Mitogen-NIL: >10 IU/mL
NIL: 0.07 IU/mL
QuantiFERON-TB Gold Plus: NEGATIVE
TB1-NIL: 0.01 IU/mL
TB2-NIL: <0 IU/mL

## 2021-03-16 LAB — HEPATITIS C ANTIBODY
Hepatitis C Ab: NONREACTIVE
SIGNAL TO CUT-OFF: 0.03 (ref <1.00)

## 2021-03-16 LAB — HEPATITIS B SURFACE ANTIGEN: Hepatitis B Surface Ag: NONREACTIVE

## 2021-03-16 LAB — HEPATITIS B CORE ANTIBODY, IGM: Hep B C IgM: NONREACTIVE

## 2021-03-16 LAB — GLUCOSE 6 PHOSPHATE DEHYDROGENASE: G-6PDH: 16.1 U/g Hgb (ref 7.0–20.5)

## 2021-03-25 NOTE — Progress Notes (Addendum)
Office Visit Note  Patient: Carol Diaz             Date of Birth: 28-Jul-1965           MRN: [id number]             PCP: Agapito GamesMetheney, Catherine D, MD Referring: Agapito GamesMetheney, Catherine D, * Visit Date: 04/05/2021 Occupation: @GUAROCC @  Subjective:  Pain in all the joints and muscles   History of Present Illness: Carol AltKathy M Cornacchia is a 55 y.o. female with history of sicca symptoms and joint pain.  She was accompanied by her daughter Carol Diaz.  Patient states that she continues to have dry mouth and dry eyes despite using over-the-counter products.  She continues to have pain in multiple joints and also muscles.  She denies any history of joint swelling.  Activities of Daily Living:  Patient reports morning stiffness for 1.5-2 hours.   Patient Reports nocturnal pain.  Difficulty dressing/grooming: Reports Difficulty climbing stairs: Reports Difficulty getting out of chair: Reports Difficulty using hands for taps, buttons, cutlery, and/or writing: Reports  Review of Systems  Constitutional:  Positive for fatigue.  HENT:  Positive for mouth dryness and nose dryness.   Eyes:  Positive for dryness. Negative for pain and itching.  Respiratory:  Negative for shortness of breath and difficulty breathing.   Cardiovascular:  Negative for chest pain and palpitations.  Gastrointestinal:  Positive for constipation and diarrhea. Negative for blood in stool.  Endocrine: Negative for increased urination.  Genitourinary:  Negative for difficulty urinating.  Musculoskeletal:  Positive for joint pain, joint pain, joint swelling, myalgias, morning stiffness, muscle tenderness and myalgias.  Skin:  Positive for color change. Negative for rash, redness and sensitivity to sunlight.  Allergic/Immunologic: Positive for susceptible to infections.  Neurological:  Positive for numbness, headaches, memory loss and weakness. Negative for dizziness.  Hematological:  Positive for bruising/bleeding tendency. Negative  for swollen glands.  Psychiatric/Behavioral:  Positive for sleep disturbance. The patient is nervous/anxious.    PMFS History:  Patient Active Problem List   Diagnosis Date Noted   GERD (gastroesophageal reflux disease) 11/25/2020   Renal insufficiency    Hypertension    Chronic fatigue syndrome    Sjogren's disease (HCC) 03/17/2020   Thiamin deficiency 09/30/2019   Localized swelling of both lower extremities 08/01/2019   Blue toes 08/01/2019   Tachycardia 06/13/2019   Abnormal ankle brachial index (ABI) 10/07/2018   Fibromyalgia 07/17/2016   CKD (chronic kidney disease) stage 2, GFR 60-89 ml/min 09/27/2015   Neck pain 06/10/2015   Obesity 09/25/2013   Essential tremor 02/02/2012   Hyperlipidemia 12/15/2011   GROSS HEMATURIA 04/06/2010   Iron deficiency anemia 09/22/2009   PALPITATIONS 09/22/2009   FATIGUE 08/31/2009   HYPOGLYCEMIA, UNSPECIFIED 11/20/2008   MENORRHAGIA 09/05/2007   DEPRESSION, MAJOR, RECURRENT 01/02/2006   Migraine headache 01/02/2006   IRRITABLE BOWEL SYNDROME 01/02/2006   INSOMNIA NOS 01/02/2006    Past Medical History:  Diagnosis Date   Chronic fatigue syndrome    Hypertension    Migraines    Palpitations    Renal insufficiency    Sjogren's disease (HCC) 03/17/2020    Family History  Problem Relation Age of Onset   Emphysema Mother    Hypertension Father    Rheum arthritis Sister    Thyroid disease Sister    Thyroid disease Sister    Diabetes Sister    Heart disease Brother [id number]       mi   Thyroid disease Brother  Epilepsy Daughter    Healthy Daughter    Healthy Daughter    Asthma Daughter    Past Surgical History:  Procedure Laterality Date   CESAREAN SECTION     ivp     LAPAROSCOPIC APPENDECTOMY N/A 05/17/2017   Procedure: DIAGNOSTIC LAPAROSCOPY APPENDECTOMY;  Surgeon: Leighton Ruff, MD;  Location: [facility];  Service: General;  Laterality: N/A;   TUBAL LIGATION     Social History   Social History Narrative   Some exercise.     Immunization History  Administered Date(s) Administered   Influenza Split 12/08/2011   Influenza Whole 01/25/2005, 01/14/2010   Influenza,inj,Quad PF,6+ Mos 01/07/2014, 12/11/2014, 12/24/2015, 02/12/2017, 11/15/2017, 11/25/2020   Influenza-Unspecified 12/25/2012   PFIZER(Purple Top)SARS-COV-2 Vaccination 05/24/2019   Td 03/15/2010   Tdap 11/25/2020     Objective: Vital Signs: BP (!) 145/85 (BP Location: Left Arm, Patient Position: Sitting, Cuff Size: Normal)    Pulse 84    Ht 5\' 6"  (1.676 m)    Wt 165 lb 12.8 oz (75.2 kg)    LMP 04/22/2018 (Exact Date)    BMI 26.76 kg/m    Physical Exam Vitals and nursing note reviewed.  Constitutional:      Appearance: She is well-developed.  HENT:     Head: Normocephalic and atraumatic.  Eyes:     Conjunctiva/sclera: Conjunctivae normal.  Cardiovascular:     Rate and Rhythm: Normal rate and regular rhythm.     Heart sounds: Normal heart sounds.  Pulmonary:     Effort: Pulmonary effort is normal.     Breath sounds: Normal breath sounds.  Abdominal:     General: Bowel sounds are normal.     Palpations: Abdomen is soft.  Musculoskeletal:     Cervical back: Normal range of motion.  Lymphadenopathy:     Cervical: No cervical adenopathy.  Skin:    General: Skin is warm and dry.     Capillary Refill: Capillary refill takes less than 2 seconds.  Neurological:     Mental Status: She is alert and oriented to person, place, and time.  Psychiatric:        Behavior: Behavior normal.     Musculoskeletal Exam: C-spine was in good range of motion.  Shoulder joints, elbow joints, wrist joints, MCPs PIPs and DIPs with good range of motion with no synovitis.  Hip joints, knee joints, ankles, MTPs and PIPs with good range of motion with no synovitis.  She had generalized hyperalgesia and positive tender points.  CDAI Exam: CDAI Score: -- Patient Global: --; Provider Global: -- Swollen: --; Tender: -- Joint Exam 04/05/2021   No joint exam has  been documented for this visit   There is currently no information documented on the homunculus. Go to the Rheumatology activity and complete the homunculus joint exam.  Investigation: No additional findings.  Imaging: XR Hand 2 View Left  Result Date: 03/10/2021 Generalized osteopenia was noted.  PIP narrowing was noted.  No MCP, intercarpal or radiocarpal joint space narrowing was noted.  No erosive changes were noted. Impression: These findings are consistent with osteoarthritis of the hand.  XR Hand 2 View Right  Result Date: 03/10/2021 Generalized osteopenia was noted.  PIP narrowing was noted.  No MCP, intercarpal or radiocarpal joint space narrowing was noted.  No erosive changes were noted. Impression: These findings are consistent with osteoarthritis of the hand.   Recent Labs: Lab Results  Component Value Date   WBC 8.0 02/16/2021   HGB 13.8 02/16/2021   PLT  362 02/16/2021   NA 136 02/11/2021   K 4.3 02/11/2021   CL 102 02/11/2021   CO2 29 02/11/2021   GLUCOSE 76 02/11/2021   BUN 24 02/11/2021   CREATININE 0.87 02/11/2021   BILITOT 0.3 02/11/2021   ALKPHOS 51 09/24/2015   AST 30 02/11/2021   ALT 44 (H) 02/11/2021   PROT 5.8 (L) 03/10/2021   ALBUMIN 3.9 09/24/2015   CALCIUM 9.5 02/11/2021   GFRAA 81 07/19/2020   QFTBGOLDPLUS NEGATIVE 03/10/2021   March 10, 2021 UA negative, SPEP normal, immunoglobulins normal, TB Gold negative, hepatitis B-, hepatitis C negative, G6PD normal, ANCA negative, lupus anticoagulant negative  March 14, 2021 AVISE lupus index -1.6, ANA negative, ENA negative, Jo 1 negative, CB CAP negative, RF IgM 5.5 , anti-CCP negative, anticardiolipin negative, beta-2 GP 1 negative, antiphosphatidylserine negative, anti-C1q negative, anti-CarP negative, antithyroglobulin IgG positive, anti-TPO negative,  Speciality Comments: No specialty comments available.  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 Jerene Pitch for possible Sjogren's withMTX and prednisone.MTX was DC'd due to side effects and Plaquenil added June 2022 without improvement per patient.  I detailed discussion with her regarding all the labs to be obtained in December.  Her serology was negative except for positive rheumatoid factor.  ANA, SSA antibodies were negative.  Left findings were discussed with the patient and her daughter at length.  She is also taking multiple medications which could be contributing to worsen dry mouth and dry eyes.  I discussed possible use of pilocarpine.  Indications side effects contraindications were discussed.  Patient did not like the side effect of sweating and declined pilocarpine.  Patient would prefer to go back to Dr. Jerene Pitch for continued of  care.  She wanted second opinion to see if any other treatment options were available.  Rheumatoid factor positive-she has positive rheumatoid factor IgM low titer.  She had no synovitis on examination.  Although she continues to have pain and discomfort in multiple joints.  She also reports history of joint swelling.  Raynaud's syndrome without gangrene-she gives history of Raynaud's phenomenon in her feet.  No nailbed capillary changes or sclerodactyly was noted.  Long term systemic steroid user - She has been on prednisone for almost 2 years by Dr. Jerene Pitch which was tapered off.  She gets frequent cortisone injections to her lower back.  High risk medication use - Methotrexate for 1 year, DC'd due to oral ulcers and fatigue.  Plaquenil since June 2022 without any improvement so far.  Primary osteoarthritis of both hands - History of pain in bilateral hands.  X-ray showed generalized osteopenia and osteoarthritis.  X-ray findings were discussed with the patient.  She had no synovitis on examination.  Neck  pain - Trapezius muscle spasm was noted.  DDD (degenerative disc disease), lumbar - She is followed by neurologist and gets frequent injections per patient.  Fibromyalgia - She was diagnosed several years ago.  She had generalized pain and discomfort.  According to the patient physical therapy makes her symptoms worse.  I offered referral to integrative therapies but she declined.  Other insomnia - History of chronic insomnia.  Good sleep hygiene was discussed.  Chronic fatigue syndrome - History of fatigue for the last 20 years.  Primary hypertension  History of IBS  Gastroesophageal reflux disease with esophagitis without  hemorrhage  Essential tremor-patient has coarse tremors.  Her thyroglobulin antibodies are positive.  TSH was normal.  Migraine without aura and without status migrainosus, not intractable  Thiamin deficiency  Anxiety and depression-she is on Cymbalta.  Family history of rheumatoid arthritis - According to the patient her sister is on methotrexate and hydroxychloroquine.  Orders: No orders of the defined types were placed in this encounter.  No orders of the defined types were placed in this encounter.  Total time spent in reviewing labs, x-rays and counseling was 30 minutes.  Follow-Up Instructions: Return if symptoms worsen or fail to improve, for sicca, +RF.   Pollyann Savoy, MD  Note - This record has been created using Animal nutritionist.  Chart creation errors have been sought, but may not always  have been located. Such creation errors do not reflect on  the standard of medical care.

## 2021-04-05 ENCOUNTER — Ambulatory Visit: Payer: BC Managed Care – PPO | Admitting: Rheumatology

## 2021-04-05 ENCOUNTER — Other Ambulatory Visit: Payer: Self-pay

## 2021-04-05 ENCOUNTER — Encounter: Payer: Self-pay | Admitting: Rheumatology

## 2021-04-05 ENCOUNTER — Encounter: Payer: Self-pay | Admitting: *Deleted

## 2021-04-05 VITALS — BP 145/85 | HR 84 | Ht 66.0 in | Wt 165.8 lb

## 2021-04-05 DIAGNOSIS — M3500 Sicca syndrome, unspecified: Secondary | ICD-10-CM

## 2021-04-05 DIAGNOSIS — Z79899 Other long term (current) drug therapy: Secondary | ICD-10-CM

## 2021-04-05 DIAGNOSIS — M19041 Primary osteoarthritis, right hand: Secondary | ICD-10-CM

## 2021-04-05 DIAGNOSIS — M5136 Other intervertebral disc degeneration, lumbar region: Secondary | ICD-10-CM

## 2021-04-05 DIAGNOSIS — R768 Other specified abnormal immunological findings in serum: Secondary | ICD-10-CM | POA: Diagnosis not present

## 2021-04-05 DIAGNOSIS — Z7952 Long term (current) use of systemic steroids: Secondary | ICD-10-CM

## 2021-04-05 DIAGNOSIS — M19042 Primary osteoarthritis, left hand: Secondary | ICD-10-CM

## 2021-04-05 DIAGNOSIS — G4709 Other insomnia: Secondary | ICD-10-CM

## 2021-04-05 DIAGNOSIS — Z8719 Personal history of other diseases of the digestive system: Secondary | ICD-10-CM

## 2021-04-05 DIAGNOSIS — G250 Essential tremor: Secondary | ICD-10-CM

## 2021-04-05 DIAGNOSIS — I10 Essential (primary) hypertension: Secondary | ICD-10-CM

## 2021-04-05 DIAGNOSIS — K2100 Gastro-esophageal reflux disease with esophagitis, without bleeding: Secondary | ICD-10-CM

## 2021-04-05 DIAGNOSIS — I7300 Raynaud's syndrome without gangrene: Secondary | ICD-10-CM | POA: Diagnosis not present

## 2021-04-05 DIAGNOSIS — F419 Anxiety disorder, unspecified: Secondary | ICD-10-CM

## 2021-04-05 DIAGNOSIS — G43009 Migraine without aura, not intractable, without status migrainosus: Secondary | ICD-10-CM

## 2021-04-05 DIAGNOSIS — G9332 Myalgic encephalomyelitis/chronic fatigue syndrome: Secondary | ICD-10-CM

## 2021-04-05 DIAGNOSIS — E519 Thiamine deficiency, unspecified: Secondary | ICD-10-CM

## 2021-04-05 DIAGNOSIS — M797 Fibromyalgia: Secondary | ICD-10-CM

## 2021-04-05 DIAGNOSIS — M542 Cervicalgia: Secondary | ICD-10-CM

## 2021-04-05 DIAGNOSIS — F32A Depression, unspecified: Secondary | ICD-10-CM

## 2021-04-05 DIAGNOSIS — Z8261 Family history of arthritis: Secondary | ICD-10-CM

## 2021-04-08 ENCOUNTER — Other Ambulatory Visit: Payer: Self-pay

## 2021-04-08 ENCOUNTER — Ambulatory Visit: Payer: BC Managed Care – PPO | Admitting: Family Medicine

## 2021-04-08 ENCOUNTER — Encounter: Payer: Self-pay | Admitting: Family Medicine

## 2021-04-08 VITALS — BP 112/71 | HR 88 | Resp 16 | Ht 66.0 in | Wt 166.0 lb

## 2021-04-08 DIAGNOSIS — R7989 Other specified abnormal findings of blood chemistry: Secondary | ICD-10-CM | POA: Insufficient documentation

## 2021-04-08 NOTE — Progress Notes (Signed)
Established Patient Office Visit  Subjective:  Patient ID: Carol Diaz, female    DOB: [birth year]  Age: 56 y.o. MRN: [id number]  CC:  Chief Complaint  Patient presents with   Discuss Lab Results     Patient saw Dr. Estanislado Pandy and had labs drawn. Patient stated her TSH was abnormal and woul dlike to discuss TSH results. Patient also would like to discuss diagnosis of Sjogrens diagnosed by Dr. Jerene Pitch in 2021. Patient stated she saw Dr. Estanislado Pandy and was told that her blood work was negative for Sjogrens. Patient would like to know if she should continue with plaquenil    HPI Carol Diaz presents for follow-up of recent results as above.  She says she is just at a point where she does not know what to think.  She was following with Dr. Jerene Pitch since 2021 he feels that she has Sjogren's.  She is currently on Plaquenil and feels like its been helpful.  But when she went for second opinion to see Dr. Patrecia Pour Dr. Divers were did not feel like she had criteria enough to meet a diagnosis of Sjogren's.  Her SSB antibody was just mildly positive at 2.6.  She did bring a copy of her recent labs  She also wanted to discuss that her antithyroglobulin IgG levels were elevated at 673.  She also had very low cortisol levels back in November and we had actually referred her to endocrine but says that she never heard back from them.  She is here today with her daughter.  She is just really frustrated that they cannot quite figure out an answer for what exactly is going on with her and her health and why she is experiencing a lot of the symptoms that she has had especially over the last 2 years.  Past Medical History:  Diagnosis Date   Chronic fatigue syndrome    Hypertension    Migraines    Palpitations    Renal insufficiency    Sjogren's disease ([place]) 03/17/2020    Past Surgical History:  Procedure Laterality Date   CESAREAN SECTION     ivp     LAPAROSCOPIC APPENDECTOMY N/A 05/17/2017    Procedure: DIAGNOSTIC LAPAROSCOPY APPENDECTOMY;  Surgeon: Leighton Ruff, MD;  Location: [facility];  Service: General;  Laterality: N/A;   TUBAL LIGATION      Family History  Problem Relation Age of Onset   Emphysema Mother    Hypertension Father    Rheum arthritis Sister    Thyroid disease Sister    Thyroid disease Sister    Diabetes Sister    Heart disease Brother 33       mi   Thyroid disease Brother    Epilepsy Daughter    Healthy Daughter    Healthy Daughter    Asthma Daughter     Social History   Socioeconomic History   Marital status: Married    Spouse name: Not on file   Number of children: 2   Years of education: Not on file   Highest education level: Not on file  Occupational History    Employer: [employer]  Tobacco Use   Smoking status: Never   Smokeless tobacco: Never  Vaping Use   Vaping Use: Never used  Substance and Sexual Activity   Alcohol use: No   Drug use: No   Sexual activity: Yes    Partners: Male    Birth control/protection: Pill  Other Topics Concern   Not on  file  Social History Narrative   Some exercise.    Social Determinants of Health   Financial Resource Strain: Not on file  Food Insecurity: Not on file  Transportation Needs: Not on file  Physical Activity: Not on file  Stress: Not on file  Social Connections: Not on file  Intimate Partner Violence: Not on file    Outpatient Medications Prior to Visit  Medication Sig Dispense Refill   cyanocobalamin (,VITAMIN B-12,) 1000 MCG/ML injection every 30 (thirty) days.     cyclobenzaprine (FLEXERIL) 10 MG tablet Take 1 tablet (10 mg total) by mouth at bedtime as needed for muscle spasms. 90 tablet 1   DULoxetine (CYMBALTA) 60 MG capsule TAKE 1 CAPSULE(60 MG) BY MOUTH DAILY 90 capsule 1   fluticasone (FLONASE) 50 MCG/ACT nasal spray Place 2 sprays into both nostrils daily. (Patient taking differently: Place 2 sprays into both nostrils as needed.) 16 g 0   folic acid  (FOLVITE) 1 MG tablet Take 1 mg by mouth daily.     hydrochlorothiazide (HYDRODIURIL) 12.5 MG tablet Take 12.5 mg by mouth every morning.     hydroxychloroquine (PLAQUENIL) 200 MG tablet Take 200 mg by mouth 2 (two) times daily.     LINZESS 290 MCG CAPS capsule TAKE 1 CAPSULE(290 MCG) BY MOUTH DAILY BEFORE BREAKFAST 90 capsule 3   magic mouthwash SOLN SWISH AND SPIT 15MLS BY MOUTH THREE TIMES DAILY AS NEEDED     omeprazole (PRILOSEC) 40 MG capsule Take 1 capsule by mouth daily.     ondansetron (ZOFRAN-ODT) 4 MG disintegrating tablet DISSOLVE ONE TABLET ON THE TONGUE EVERY 8 HOURS AS NEEDED FOR NAUSEA OR VOMITING 20 tablet 0   primidone (MYSOLINE) 50 MG tablet Take 100 mg by mouth 2 (two) times daily.     propranolol ER (INDERAL LA) 60 MG 24 hr capsule Take 1 capsule (60 mg total) by mouth daily. 90 capsule 1   SUMAtriptan (IMITREX) 100 MG tablet TAKE 1 TABLET BY MOUTH EVERY 2 HOURS AS NEEDED MIGRAINES AS DIRECTED 10 tablet 11   Thiamine HCl (B-1) 100 MG TABS Take 1 tablet by mouth 2 (two) times daily.     topiramate (TOPAMAX) 50 MG tablet Take 50 mg by mouth 2 (two) times daily.     traMADol (ULTRAM) 50 MG tablet Take 50 mg by mouth 3 (three) times daily as needed for moderate pain.     Vitamin D, Ergocalciferol, (DRISDOL) 1.25 MG (50000 UNIT) CAPS capsule Take 50,000 Units by mouth once a week.     cetirizine (ZYRTEC) 10 MG tablet      No facility-administered medications prior to visit.    Allergies  Allergen Reactions   Fluoxetine Other (See Comments)    Made her tremor worse    Lexapro [Escitalopram Oxalate] Other (See Comments)    Headache   Morphine Nausea And Vomiting   Sertraline Other (See Comments)    Headache     ROS Review of Systems    Objective:    Physical Exam  BP 112/71    Pulse 88    Resp 16    Ht '5\' 6"'  (1.676 m)    Wt 166 lb (75.3 kg)    LMP 04/22/2018 (Exact Date)    SpO2 95%    BMI 26.79 kg/m  Wt Readings from Last 3 Encounters:  04/08/21 166 lb (75.3  kg)  04/05/21 165 lb 12.8 oz (75.2 kg)  03/10/21 169 lb 6.4 oz (76.8 kg)     There are  no preventive care reminders to display for this patient.  There are no preventive care reminders to display for this patient.  Lab Results  Component Value Date   TSH 1.63 03/10/2021   Lab Results  Component Value Date   WBC 8.0 02/16/2021   HGB 13.8 02/16/2021   HCT 41.0 02/16/2021   MCV 89.9 02/16/2021   PLT 362 02/16/2021   Lab Results  Component Value Date   NA 136 02/11/2021   K 4.3 02/11/2021   CO2 29 02/11/2021   GLUCOSE 76 02/11/2021   BUN 24 02/11/2021   CREATININE 0.87 02/11/2021   BILITOT 0.3 02/11/2021   ALKPHOS 51 09/24/2015   AST 30 02/11/2021   ALT 44 (H) 02/11/2021   PROT 5.8 (L) 03/10/2021   ALBUMIN 3.9 09/24/2015   CALCIUM 9.5 02/11/2021   EGFR 78 12/06/2020   Lab Results  Component Value Date   CHOL 233 (H) 12/06/2020   Lab Results  Component Value Date   HDL 81 12/06/2020   Lab Results  Component Value Date   LDLCALC 127 (H) 12/06/2020   Lab Results  Component Value Date   TRIG 132 12/06/2020   Lab Results  Component Value Date   CHOLHDL 2.9 12/06/2020   Lab Results  Component Value Date   HGBA1C 5.5 12/06/2020      Assessment & Plan:   Problem List Items Addressed This Visit       Other   Elevated antithyroglobulin IgG - Primary    We did discuss that this indicates a higher risk for autoimmune thyroiditis and eventually becoming hypothyroid.  Interestingly though her antithyroid peroxidase antibodies were actually undetectable.  Her recent TSH done about 4 weeks ago was normal.  Hopefully the endocrinologist could help [place] better determine if this needs to be monitored or if it is significant.      Other Visit Diagnoses     Low serum cortisol level           Low serum cortisol-we had made a referral to endocrinology almost 8 weeks ago.  So we have called today while she was here to see if we could get her an appointment I am  not sure what happened.  But we had sent over the information back at the end of November.  She has been on steroids on and off and certainly that could cause some suppression but I do want to have her evaluated for other causes for low serum cortisol.  Unfortunately I really do not have some great answers for her.  I have known her for over a decade at this point and I do agree that something has changed something is definitely different she is had a lot of new onset symptoms in the last couple of years particularly swelling and swelling in her face which is definitely new.  I can only imagine how frustrating this has been for her to not really have an answer.  And without an answer not having a defined course of action to improve her symptoms.  We discussed options including a third rheumatology consult since the initial rheumatologist in the consultation have differences of opinion.  We also discussed working on the endocrine referral to see if there may be something underlying going on that could actually explain some of her symptoms.  Hopefully they can address the low cortisol and elevated antithyroglobulin antibody levels.    No orders of the defined types were placed in this encounter.   Follow-up: No  follow-ups on file.    Beatrice Lecher, MD

## 2021-04-08 NOTE — Assessment & Plan Note (Signed)
We did discuss that this indicates a higher risk for autoimmune thyroiditis and eventually becoming hypothyroid.  Interestingly though her antithyroid peroxidase antibodies were actually undetectable.  Her recent TSH done about 4 weeks ago was normal.  Hopefully the endocrinologist could help [place] better determine if this needs to be monitored or if it is significant.

## 2021-04-18 ENCOUNTER — Ambulatory Visit: Payer: BC Managed Care – PPO | Admitting: Family Medicine

## 2021-04-18 NOTE — Telephone Encounter (Signed)
This encounter was created in error - please disregard.

## 2021-04-19 ENCOUNTER — Ambulatory Visit: Payer: BC Managed Care – PPO | Admitting: Family Medicine

## 2021-05-02 ENCOUNTER — Ambulatory Visit: Payer: BC Managed Care – PPO | Admitting: Internal Medicine

## 2021-05-02 ENCOUNTER — Other Ambulatory Visit: Payer: Self-pay

## 2021-05-02 ENCOUNTER — Telehealth: Payer: Self-pay

## 2021-05-02 ENCOUNTER — Encounter: Payer: Self-pay | Admitting: Internal Medicine

## 2021-05-02 VITALS — BP 114/72 | HR 75 | Ht 66.0 in | Wt 164.0 lb

## 2021-05-02 DIAGNOSIS — R7989 Other specified abnormal findings of blood chemistry: Secondary | ICD-10-CM | POA: Diagnosis not present

## 2021-05-02 DIAGNOSIS — E063 Autoimmune thyroiditis: Secondary | ICD-10-CM

## 2021-05-02 NOTE — Progress Notes (Signed)
Name: Carol Diaz  MRN/ DOB: [id number], 05/08/1965    Age/ Sex: 56 y.o., female    PCP: Hali Marry, MD   Reason for Endocrinology Evaluation: Low cortisol     Date of Initial Endocrinology Evaluation: 05/02/2021     HPI: Carol Diaz is a 56 y.o. female with a past medical history of GERD, IBS, migraine headaches, essential tremors and thiamine deficiency. The patient presented for initial endocrinology clinic visit on 05/02/2021 for consultative assistance with her low cortisol.   Patient has been referred here for further evaluation of low serum cortisol at 0.8 mcg/DL in 01/2021, this was ordered during evaluation of lower extremity weakness and facial swelling.    Patient has been evaluated by rheumatology 02/2019 for polyarthralgia and sicca complex Patient on Plaquenil for prior diagnosis of Sjogren's syndrome   Of note the patient had abdomen/pelvis CT 02/24/2021 which was unremarkable including adrenal glands.  She is accompanied by her daughter today who has noted puffiness of mother's face as well as development of a buffalo hump Weight has been stable  , but per patient she has gained weight since 2021 Has noted facial swelling and puffiness for the past 8-10 months ago  Has diarrhea and loose stools for 6 months  Denies vomiting  She does receives intra-articular injections  , last one was 4 months ago , she gets them every 3-4 months     In review of old lab work the patient has an elevated anti-TPO antibody  673 IU/mL , she has family history of thyroid disease, historically TFTs have been normal.    HISTORY:  Past Medical History:  Past Medical History:  Diagnosis Date   Chronic fatigue syndrome    Hypertension    Migraines    Palpitations    Renal insufficiency    Sjogren's disease ([place]) 03/17/2020   Past Surgical History:  Past Surgical History:  Procedure Laterality Date   CESAREAN SECTION     ivp     LAPAROSCOPIC APPENDECTOMY  N/A 05/17/2017   Procedure: DIAGNOSTIC LAPAROSCOPY APPENDECTOMY;  Surgeon: Leighton Ruff, MD;  Location: [facility];  Service: General;  Laterality: N/A;   TUBAL LIGATION      Social History:  reports that she has never smoked. She has never used smokeless tobacco. She reports that she does not drink alcohol and does not use drugs. Family History: family history includes Asthma in her daughter; Diabetes in her sister; Emphysema in her mother; Epilepsy in her daughter; Healthy in her daughter and daughter; Heart disease (age of onset: 78) in her brother; Hypertension in her father; Rheum arthritis in her sister; Thyroid disease in her brother, sister, and sister.   HOME MEDICATIONS: Allergies as of 05/02/2021       Reactions   Fluoxetine Other (See Comments)   Made her tremor worse   Lexapro [escitalopram Oxalate] Other (See Comments)   Headache   Morphine Nausea And Vomiting   Sertraline Other (See Comments)   Headache        Medication List        Accurate as of May 02, 2021  3:58 PM. If you have any questions, ask your nurse or doctor.          B-1 100 MG Tabs Take 1 tablet by mouth 2 (two) times daily.   cyanocobalamin 1000 MCG/ML injection Commonly known as: (VITAMIN B-12) every 30 (thirty) days.   cyclobenzaprine 10 MG tablet Commonly known as: FLEXERIL Take  1 tablet (10 mg total) by mouth at bedtime as needed for muscle spasms.   DULoxetine 60 MG capsule Commonly known as: CYMBALTA TAKE 1 CAPSULE(60 MG) BY MOUTH DAILY   fluticasone 50 MCG/ACT nasal spray Commonly known as: FLONASE Place 2 sprays into both nostrils daily. What changed:  when to take this reasons to take this   folic acid 1 MG tablet Commonly known as: FOLVITE Take 1 mg by mouth daily.   hydrochlorothiazide 12.5 MG tablet Commonly known as: HYDRODIURIL Take 12.5 mg by mouth every morning.   hydroxychloroquine 200 MG tablet Commonly known as: PLAQUENIL Take 200 mg by mouth 2 (two)  times daily.   Linzess 290 MCG Caps capsule Generic drug: linaclotide TAKE 1 CAPSULE(290 MCG) BY MOUTH DAILY BEFORE BREAKFAST   magic mouthwash Soln SWISH AND SPIT 15MLS BY MOUTH THREE TIMES DAILY AS NEEDED   omeprazole 40 MG capsule Commonly known as: PRILOSEC Take 1 capsule by mouth daily.   ondansetron 4 MG disintegrating tablet Commonly known as: ZOFRAN-ODT DISSOLVE ONE TABLET ON THE TONGUE EVERY 8 HOURS AS NEEDED FOR NAUSEA OR VOMITING   primidone 50 MG tablet Commonly known as: MYSOLINE Take 100 mg by mouth 2 (two) times daily.   propranolol ER 60 MG 24 hr capsule Commonly known as: INDERAL LA Take 1 capsule (60 mg total) by mouth daily.   SUMAtriptan 100 MG tablet Commonly known as: IMITREX TAKE 1 TABLET BY MOUTH EVERY 2 HOURS AS NEEDED MIGRAINES AS DIRECTED   topiramate 50 MG tablet Commonly known as: TOPAMAX Take 50 mg by mouth 2 (two) times daily.   traMADol 50 MG tablet Commonly known as: ULTRAM Take 50 mg by mouth 3 (three) times daily as needed for moderate pain.   Vitamin D (Ergocalciferol) 1.25 MG (50000 UNIT) Caps capsule Commonly known as: DRISDOL Take 50,000 Units by mouth once a week.          REVIEW OF SYSTEMS: A comprehensive ROS was conducted with the patient and is negative except as per HPI    OBJECTIVE:  VS: BP 114/72 (BP Location: Left Arm, Patient Position: Sitting, Cuff Size: Small)    Pulse 75    Ht 5\' 6"  (1.676 m)    Wt 164 lb (74.4 kg)    LMP 04/22/2018 (Exact Date)    SpO2 96%    BMI 26.47 kg/m    Wt Readings from Last 3 Encounters:  05/02/21 164 lb (74.4 kg)  04/08/21 166 lb (75.3 kg)  04/05/21 165 lb 12.8 oz (75.2 kg)     EXAM: General: Pt appears well and is in NAD Patient with Malar rash  Eyes: External eye exam normal without stare, lid lag or exophthalmos.  EOM intact.  PERRL.  Neck: General: Supple without adenopathy. Thyroid: Thyroid size normal.  No goiter or nodules appreciated.   Lungs: Clear with good BS  bilat with no rales, rhonchi, or wheezes  Heart: Auscultation: RRR.  Abdomen: Normoactive bowel sounds, soft, nontender, without masses or organomegaly palpable  Extremities:  BL LE: No pretibial edema normal ROM and strength.  Mental Status: Judgment, insight: Intact Orientation: Oriented to time, place, and person Mood and affect: No depression, anxiety, or agitation     DATA REVIEWED:  Latest Reference Range & Units 02/11/21 00:00  Cortisol, Plasma mcg/dL 0.8 (L)      Latest Reference Range & Units 03/10/21 10:17  TSH mIU/L 1.63      12/24/2019 Sjogren's Ab 2.6 ( <1.0)  Anti-DS IgG 25.99  ANA 10.42 (<20)  RF IgM 5.5 ( <3.5)  Ant-TPO 673 IU/mL    ASSESSMENT/PLAN/RECOMMENDATIONS:   Low serum cortisol   -Clinically the daughter and the patient endorse Cushing syndrome with weight gain, buffalo hump, facial swelling but her serum cortisol is consistent with low serum cortisol. -I suspect this is secondary to exogenous glucocorticoid use whether this is oral or intra-articular -Today we discussed the effects of exogenous glucocorticoids on hypothalamic-pituitary-adrenal axis, I have strongly urged the patient to seek pain management as they may have better options than glucocorticoid use -Patient will return for fasting serum ACTH, cortisol, and cosyntropin stimulation test    2. Hashimoto's Disease:  -Patient with multiple nonspecific symptoms -No local neck symptoms -TFTs have been normal, hence no indication for LT-for replacement I explained to the patient that Hashimoto's Disease is an autoimmune - mediated destruction of the thyroid gland. The usual course of Hashimoto's thyroiditis is the gradual loss of thyroid function. Overt hypothyroidism occurs at a rate of ~ 5% per year.  -I have recommended annual TFTs through PCP or sooner if clinically indicated  Follow-up pending lab results    Signed electronically by: Mack Guise, MD  [facility]  Endocrinology  [address] [place], [place] [address] Phone: [phone] FAX: [phone]   CC: Hali Marry, [place] [address] Phone: [phone] Fax: [phone]   Return to Endocrinology clinic as below: Future Appointments  Date Time Provider [place]  05/11/2021  8:00 AM [facility] LAB [facility] None  05/11/2021 10:15 AM [facility] NURSE [facility] None

## 2021-05-02 NOTE — Patient Instructions (Signed)
Hashimoto's Disease is an autoimmune - mediated destruction of the thyroid gland. The usual course of Hashimoto's thyroiditis is the gradual loss of thyroid function. Overt hypothyroidism occurs at a rate of ~ 5% per year.   

## 2021-05-02 NOTE — Telephone Encounter (Signed)
error 

## 2021-05-11 ENCOUNTER — Ambulatory Visit: Payer: BC Managed Care – PPO

## 2021-05-11 ENCOUNTER — Other Ambulatory Visit (INDEPENDENT_AMBULATORY_CARE_PROVIDER_SITE_OTHER): Payer: BC Managed Care – PPO

## 2021-05-11 ENCOUNTER — Telehealth: Payer: Self-pay | Admitting: Family Medicine

## 2021-05-11 ENCOUNTER — Other Ambulatory Visit: Payer: Self-pay

## 2021-05-11 DIAGNOSIS — R7989 Other specified abnormal findings of blood chemistry: Secondary | ICD-10-CM

## 2021-05-11 DIAGNOSIS — E063 Autoimmune thyroiditis: Secondary | ICD-10-CM | POA: Diagnosis not present

## 2021-05-11 DIAGNOSIS — Z1231 Encounter for screening mammogram for malignant neoplasm of breast: Secondary | ICD-10-CM

## 2021-05-11 LAB — BASIC METABOLIC PANEL
BUN: 25 mg/dL — ABNORMAL HIGH (ref 6–23)
CO2: 26 mEq/L (ref 19–32)
Calcium: 9.8 mg/dL (ref 8.4–10.5)
Chloride: 105 mEq/L (ref 96–112)
Creatinine, Ser: 0.99 mg/dL (ref 0.40–1.20)
GFR: 64.36 mL/min (ref >60.00)
Glucose, Bld: 87 mg/dL (ref 70–99)
Potassium: 3.5 mEq/L (ref 3.5–5.1)
Sodium: 141 mEq/L (ref 135–145)

## 2021-05-11 LAB — CORTISOL
Cortisol, Plasma: 8.5 ug/dL
Cortisol, Plasma: 18.9 ug/dL
Cortisol, Plasma: 22.9 ug/dL

## 2021-05-11 LAB — T4, FREE: Free T4: 0.85 ng/dL (ref 0.60–1.60)

## 2021-05-11 LAB — TSH: TSH: 1.54 u[IU]/mL (ref 0.35–5.50)

## 2021-05-11 MED ORDER — COSYNTROPIN 0.25 MG IJ SOLR
0.2500 mg | Freq: Once | INTRAMUSCULAR | Status: AC
  Administered 2021-05-11: 0.25 mg via INTRAVENOUS

## 2021-05-11 NOTE — Telephone Encounter (Signed)
MyChart note sent regarding mammogram.

## 2021-05-11 NOTE — Progress Notes (Unsigned)
Patient gave verbal consent to give Cosyntropin injection. Patient demographics verified. Injection given in Left deltoid

## 2021-05-12 NOTE — Addendum Note (Signed)
Addended by: Narda Rutherford on: 05/12/2021 08:47 AM   Modules accepted: Orders

## 2021-05-13 ENCOUNTER — Ambulatory Visit: Payer: BC Managed Care – PPO

## 2021-05-14 LAB — ACTH: C206 ACTH: 7 pg/mL (ref 6–50)

## 2021-06-02 ENCOUNTER — Other Ambulatory Visit: Payer: Self-pay

## 2021-06-02 ENCOUNTER — Ambulatory Visit (INDEPENDENT_AMBULATORY_CARE_PROVIDER_SITE_OTHER): Payer: BC Managed Care – PPO

## 2021-06-02 DIAGNOSIS — Z1231 Encounter for screening mammogram for malignant neoplasm of breast: Secondary | ICD-10-CM | POA: Diagnosis not present

## 2021-06-03 NOTE — Progress Notes (Signed)
Please call patient. Normal mammogram.  Repeat in 1 year.  

## 2021-06-10 ENCOUNTER — Other Ambulatory Visit: Payer: Self-pay | Admitting: Family Medicine

## 2021-06-22 ENCOUNTER — Telehealth: Payer: Self-pay | Admitting: General Practice

## 2021-06-22 NOTE — Telephone Encounter (Signed)
Transition Care Management Follow-up Telephone Call ?Date of discharge and from where: 06/22/21 from [place] ?How have you been since you were released from the hospital? Feeling a little better but still having headaches.  ?Any questions or concerns? No ? ?Items Reviewed: ?Did the pt receive and understand the discharge instructions provided? Yes  ?Medications obtained and verified? No  ?Other? No  ?Any new allergies since your discharge? No  ?Dietary orders reviewed? Yes ?Do you have support at home? Yes  ? ?Home Care and Equipment/Supplies: ?Were home health services ordered? no ? ?Functional Questionnaire: (I = Independent and D = Dependent) ?ADLs: I ? ?Bathing/Dressing- I ? ?Meal Prep- I ? ?Eating- I ? ?Maintaining continence- I ? ?Transferring/Ambulation- I ? ?Managing Meds- I ? ?Follow up appointments reviewed: ? ?PCP Hospital f/u appt confirmed? Yes  Scheduled to see Dr. Madilyn Fireman on 07/05/21 @ 1130. ?[facility] f/u appt confirmed? No  ?Are transportation arrangements needed? No  ?If their condition worsens, is the pt aware to call PCP or go to the Emergency Dept.? Yes ?Was the patient provided with contact information for the PCP's office or ED? Yes ?Was to pt encouraged to call back with questions or concerns? Yes  ?

## 2021-06-24 ENCOUNTER — Encounter: Payer: Self-pay | Admitting: Emergency Medicine

## 2021-06-24 ENCOUNTER — Emergency Department
Admission: EM | Admit: 2021-06-24 | Discharge: 2021-06-24 | Disposition: A | Discharge status: Home / Self Care | Payer: BC Managed Care – PPO | Source: Home / Self Care | Attending: Family Medicine | Admitting: Family Medicine

## 2021-06-24 ENCOUNTER — Emergency Department (INDEPENDENT_AMBULATORY_CARE_PROVIDER_SITE_OTHER): Payer: BC Managed Care – PPO

## 2021-06-24 DIAGNOSIS — R1031 Right lower quadrant pain: Secondary | ICD-10-CM

## 2021-06-24 DIAGNOSIS — R1084 Generalized abdominal pain: Secondary | ICD-10-CM | POA: Diagnosis not present

## 2021-06-24 DIAGNOSIS — R1010 Upper abdominal pain, unspecified: Secondary | ICD-10-CM | POA: Diagnosis not present

## 2021-06-24 DIAGNOSIS — R112 Nausea with vomiting, unspecified: Secondary | ICD-10-CM

## 2021-06-24 DIAGNOSIS — R197 Diarrhea, unspecified: Secondary | ICD-10-CM

## 2021-06-24 DIAGNOSIS — R109 Unspecified abdominal pain: Secondary | ICD-10-CM

## 2021-06-24 DIAGNOSIS — K567 Ileus, unspecified: Secondary | ICD-10-CM | POA: Diagnosis not present

## 2021-06-24 LAB — I-STAT CREATININE (MANUAL ENTRY): Creatinine, Ser: 0.9 (ref 0.50–1.10)

## 2021-06-24 MED ORDER — IOHEXOL 300 MG/ML  SOLN
100.0000 mL | Freq: Once | INTRAMUSCULAR | Status: AC | PRN
  Administered 2021-06-24: 100 mL via INTRAVENOUS

## 2021-06-24 MED ORDER — ONDANSETRON 4 MG PO TBDP
4.0000 mg | ORAL_TABLET | Freq: Once | ORAL | Status: AC
  Administered 2021-06-24: 4 mg via ORAL

## 2021-06-24 MED ORDER — AMOXICILLIN-POT CLAVULANATE 875-125 MG PO TABS: ORAL_TABLET | ORAL | 0 refills | Status: DC

## 2021-06-24 MED ORDER — ONDANSETRON 4 MG PO TBDP: ORAL_TABLET | ORAL | 0 refills | Status: DC

## 2021-06-24 NOTE — ED Notes (Signed)
Labs drawn from PIV - 15 cc waste prior to lab draw - PIV removed after lab draw ?

## 2021-06-24 NOTE — Discharge Instructions (Signed)
Begin small sips of clear liquids (apple juice, clear grape juice, Jello, etc) for about 24 hours.  If improved, may slowly advance to a [employer] (Bananas, Rice, Applesauce, Toast). Then gradually resume a regular diet when tolerated.  ? ?If symptoms become significantly worse during the night or over the weekend, proceed to the local emergency room. ? ?

## 2021-06-24 NOTE — ED Triage Notes (Signed)
Ongoing nausea w/ intermittent HA ?Zofran 4 mg PO at  0400 ?Diarrhea last 2 days  ( greasy per pt)  ?Spinal leak 2 weeks ago - 2 blood patches since then  ?Here w/ husband  ?

## 2021-06-24 NOTE — ED Notes (Signed)
RN made an appointment for pt w/ PCP office. Only 2 providers available. Appoint to view AVS via My Chart ?

## 2021-06-24 NOTE — ED Provider Notes (Signed)
?[facility] ? ? ? ?CSN: [id number] ?Arrival date & time: 06/24/21  [id number] ? ? ?  ? ?History   ?Chief Complaint ?Chief Complaint  ?Patient presents with  ? Nausea  ? ? ?HPI ?Carol Diaz is a 56 y.o. female.  ? ?Patient complains of nausea, vomiting, abdominal cramping, and occasional loose stools during the past five days.  She has had sweats but no fever.  During the past 4 to 5 weeks she has had recurring abdominal bloating, abdominal pain, and "greasy" stools. ?During the past six months she has had several epidural injections for treatment of spinal stenosis (last injection 4 weeks ago).  Three weeks ago she developed persistent migraine-like headaches with nausea that required two "blood patches" before her headache began to abate.  Two days ago she presented to [facility] ED for treatment of recurrent nausea/vomiting and headache.  There she had a benign exam, and responded well to IV fluids, Toradol injection, and Compazine injection. ?Her primary concern today is her nausea, abdominal bloating/cramps, and greasy stools.  She denies respiratory and GU symptoms.  Patient's last menstrual period was 04/22/2018 (exact date).  ?Past surgical history of two c-sections, and appendectomy four years ago. ?Family history of gallbladder disease in two sisters, and diverticulitis in her mother. ? ? ?The history is provided by the patient and the spouse.  ? ?Past Medical History:  ?Diagnosis Date  ? Chronic fatigue syndrome   ? Hypertension   ? Migraines   ? Palpitations   ? Renal insufficiency   ? Sjogren's disease ([place]) 03/17/2020  ? ? ?Patient Active Problem List  ? Diagnosis Date Noted  ? Low serum cortisol level 05/02/2021  ? Hashimoto's disease 05/02/2021  ? Elevated antithyroglobulin IgG 04/08/2021  ? GERD (gastroesophageal reflux disease) 11/25/2020  ? Renal insufficiency   ? Hypertension   ? Chronic fatigue syndrome   ? Sjogren's disease ([place]) 03/17/2020  ? Thiamin  deficiency 09/30/2019  ? Localized swelling of both lower extremities 08/01/2019  ? Blue toes 08/01/2019  ? Tachycardia 06/13/2019  ? Abnormal ankle brachial index (ABI) 10/07/2018  ? Fibromyalgia 07/17/2016  ? CKD (chronic kidney disease) stage 2, GFR 60-89 ml/min 09/27/2015  ? Neck pain 06/10/2015  ? Obesity 09/25/2013  ? Essential tremor 02/02/2012  ? Hyperlipidemia 12/15/2011  ? GROSS HEMATURIA 04/06/2010  ? Iron deficiency anemia 09/22/2009  ? PALPITATIONS 09/22/2009  ? FATIGUE 08/31/2009  ? HYPOGLYCEMIA, UNSPECIFIED 11/20/2008  ? MENORRHAGIA 09/05/2007  ? DEPRESSION, MAJOR, RECURRENT 01/02/2006  ? Migraine headache 01/02/2006  ? IRRITABLE BOWEL SYNDROME 01/02/2006  ? INSOMNIA NOS 01/02/2006  ? ? ?Past Surgical History:  ?Procedure Laterality Date  ? CESAREAN SECTION    ? ivp    ? LAPAROSCOPIC APPENDECTOMY N/A 05/17/2017  ? Procedure: DIAGNOSTIC LAPAROSCOPY APPENDECTOMY;  Surgeon: Leighton Ruff, MD;  Location: [facility];  Service: General;  Laterality: N/A;  ? TUBAL LIGATION    ? ? ?OB History   ? ? Gravida  ?2  ? Para  ?2  ? Term  ?2  ? Preterm  ?   ? AB  ?   ? Living  ?2  ?  ? ? SAB  ?   ? IAB  ?   ? Ectopic  ?   ? Multiple  ?   ? Live Births  ?   ?   ?  ?  ? ? ? ?Home Medications   ? ?Prior to Admission medications   ?Medication Sig Start  Date End Date Taking? Authorizing Provider  ?amoxicillin-clavulanate (AUGMENTIN) 875-125 MG tablet Take one tab PO Q8hr. 06/24/21  Yes Kerney Hopfensperger, Ishmael Holter, MD  ?cyanocobalamin (,VITAMIN B-12,) 1000 MCG/ML injection every 30 (thirty) days. 08/06/19   [provider]  ?cyclobenzaprine (FLEXERIL) 10 MG tablet Take 1 tablet (10 mg total) by mouth at bedtime as needed for muscle spasms. 11/25/20   Hali Marry, MD  ?DULoxetine (CYMBALTA) 60 MG capsule TAKE 1 CAPSULE(60 MG) BY MOUTH DAILY 11/25/20   Hali Marry, MD  ?fluticasone (FLONASE) 50 MCG/ACT nasal spray Place 2 sprays into both nostrils daily. ?Patient taking differently: Place 2 sprays into both nostrils  as needed. 01/24/21   Couture, Cortni S, PA-C  ?folic acid (FOLVITE) 1 MG tablet Take 1 mg by mouth daily. 03/29/20   [provider]  ?hydrochlorothiazide (HYDRODIURIL) 12.5 MG tablet Take 12.5 mg by mouth every morning. 09/14/20   [provider]  ?HYDROcodone-acetaminophen ([place]) 7.5-325 MG tablet Take 1 tablet by mouth 3 (three) times daily as needed. 06/09/21   [provider]  ?hydroxychloroquine (PLAQUENIL) 200 MG tablet Take 200 mg by mouth 2 (two) times daily. 06/18/20   [provider]  ?Rolan Lipa 290 MCG CAPS capsule TAKE 1 CAPSULE(290 MCG) BY MOUTH DAILY BEFORE BREAKFAST 01/12/21   Hali Marry, MD  ?magic mouthwash SOLN SWISH AND SPIT 15MLS BY MOUTH THREE TIMES DAILY AS NEEDED 12/31/20   [provider]  ?meloxicam (MOBIC) 15 MG tablet Take 15 mg by mouth daily. 05/19/21   [provider]  ?omeprazole (PRILOSEC) 40 MG capsule Take 1 capsule by mouth daily. 08/03/20   [provider]  ?ondansetron (ZOFRAN-ODT) 4 MG disintegrating tablet DISSOLVE ONE TABLET ON THE TONGUE EVERY 6 to 8 HOURS AS NEEDED FOR NAUSEA OR VOMITING 06/24/21   Kandra Nicolas, MD  ?primidone (MYSOLINE) 50 MG tablet Take 100 mg by mouth 2 (two) times daily. 11/18/20   [provider]  ?prochlorperazine (COMPAZINE) 10 MG tablet Take by mouth. 06/22/21   [provider]  ?propranolol ER (INDERAL LA) 60 MG 24 hr capsule TAKE ONE CAPSULE BY MOUTH ONCE DAILY 06/10/21   Hali Marry, MD  ?SUMAtriptan (IMITREX) 100 MG tablet TAKE 1 TABLET BY MOUTH EVERY 2 HOURS AS NEEDED MIGRAINES AS DIRECTED 07/19/20   Hali Marry, MD  ?Thiamine HCl (B-1) 100 MG TABS Take 1 tablet by mouth 2 (two) times daily. 07/16/19   [provider]  ?topiramate (TOPAMAX) 50 MG tablet Take 50 mg by mouth 2 (two) times daily. 08/07/20   Hali Marry, MD  ?traMADol (ULTRAM) 50 MG tablet Take 50 mg by mouth 3 (three) times daily as needed for moderate pain.  07/12/19   [provider]  ?Vitamin D, Ergocalciferol, (DRISDOL) 1.25 MG (50000 UNIT) CAPS capsule Take 50,000 Units by mouth once a week. 07/16/19   [provider]  ? ? ?Family History ?Family History  ?Problem Relation Age of Onset  ? Emphysema Mother   ? Hypertension Father   ? Rheum arthritis Sister   ? Thyroid disease Sister   ? Thyroid disease Sister   ? Diabetes Sister   ? Heart disease Brother 63  ?     mi  ? Thyroid disease Brother   ? Epilepsy Daughter   ? Healthy Daughter   ? Healthy Daughter   ? Asthma Daughter   ? ? ?Social History ?Social History  ? ?Tobacco Use  ? Smoking status: Never  ? Smokeless  tobacco: Never  ?Vaping Use  ? Vaping Use: Never used  ?Substance Use Topics  ? Alcohol use: No  ? Drug use: No  ? ? ? ?Allergies   ?Fluoxetine, Lexapro [escitalopram oxalate], Morphine, and Sertraline ? ? ?Review of Systems ?Review of Systems  ?Constitutional:  Positive for activity change, appetite change, chills, diaphoresis and fatigue. Negative for fever.  ?HENT: Negative.    ?Eyes: Negative.   ?Respiratory: Negative.    ?Cardiovascular: Negative.   ?Gastrointestinal:  Positive for abdominal distention, abdominal pain, diarrhea, nausea and vomiting. Negative for anal bleeding, blood in stool and constipation.  ?Endocrine: Negative.   ?Genitourinary: Negative.   ?Musculoskeletal: Negative.   ?Skin: Negative.   ?Neurological:  Positive for headaches. Negative for syncope, facial asymmetry, weakness and numbness.  ?Hematological:  Negative for adenopathy.  ? ? ?Physical Exam ?Triage Vital Signs ?ED Triage Vitals  ?Enc Vitals Group  ?   BP 06/24/21 0906 125/85  ?   Pulse Rate 06/24/21 0906 89  ?   Resp 06/24/21 0906 17  ?   Temp 06/24/21 0906 98.6 ?F (37 ?C)  ?   Temp Source 06/24/21 0906 Oral  ?   SpO2 06/24/21 0906 100 %  ?   Weight 06/24/21 0909 160 lb (72.6 kg)  ?   Height 06/24/21 0909 5\' 6"  (1.676 m)  ?   Head Circumference --   ?   Peak Flow --   ?   Pain Score 06/24/21 0909 5  ?    Pain Loc --   ?   Pain Edu? --   ?   Excl. in [place]? --   ? ?No data found. ? ?Updated Vital Signs ?BP 125/85 (BP Location: Left Arm)   Pulse 89   Temp 98.6 ?F (37 ?C) (Oral)   Resp 17   Ht 5\' 6"  (1.676 m)   Wt 72.

## 2021-06-25 LAB — COMPLETE METABOLIC PANEL WITH GFR
AG Ratio: 1.3 (calc) (ref 1.0–2.5)
ALT: 41 U/L — ABNORMAL HIGH (ref 6–29)
Albumin: 3.5 g/dL — ABNORMAL LOW (ref 3.6–5.1)
Alkaline phosphatase (APISO): 88 U/L (ref 37–153)
BUN: 14 mg/dL (ref 7–25)
CO2: 20 mmol/L (ref 20–32)
Calcium: 9.4 mg/dL (ref 8.6–10.4)
Chloride: 105 mmol/L (ref 98–110)
Creat: 0.86 mg/dL (ref 0.50–1.03)
Globulin: 2.7 g/dL (calc) (ref 1.9–3.7)
Glucose, Bld: 78 mg/dL (ref 65–99)
Sodium: 139 mmol/L (ref 135–146)
Total Bilirubin: 0.5 mg/dL (ref 0.2–1.2)
Total Protein: 6.2 g/dL (ref 6.1–8.1)
eGFR: 80 mL/min/{1.73_m2} (ref >=60)

## 2021-06-25 LAB — CBC WITH DIFFERENTIAL/PLATELET
Absolute Monocytes: 1222 cells/uL — ABNORMAL HIGH (ref 200–950)
Basophils Absolute: 8 cells/uL (ref 0–200)
Basophils Relative: 0.1 %
Eosinophils Absolute: 107 cells/uL (ref 15–500)
Eosinophils Relative: 1.3 %
HCT: 37.1 % (ref 35.0–45.0)
Hemoglobin: 12.3 g/dL (ref 11.7–15.5)
Lymphs Abs: 1271 cells/uL (ref 850–3900)
MCH: 28.3 pg (ref 27.0–33.0)
MCHC: 33.2 g/dL (ref 32.0–36.0)
MCV: 85.5 fL (ref 80.0–100.0)
MPV: 11 fL (ref 7.5–12.5)
Monocytes Relative: 14.9 %
Neutro Abs: 5592 cells/uL (ref 1500–7800)
Neutrophils Relative %: 68.2 %
Platelets: 257 10*3/uL (ref 140–400)
RBC: 4.34 10*6/uL (ref 3.80–5.10)
RDW: 14.9 % (ref 11.0–15.0)
Total Lymphocyte: 15.5 %
WBC: 8.2 10*3/uL (ref 3.8–10.8)

## 2021-06-25 LAB — LIPASE: Lipase: 12 U/L (ref 7–60)

## 2021-06-25 LAB — AMYLASE: Amylase: 65 U/L (ref 21–101)

## 2021-06-27 NOTE — Progress Notes (Signed)
?  HPI with pertinent ROS:  ? ?CC: urgent care follow up ? ?HPI: ?Pleasant 56 year old female presenting today for a follow up after being seen at the ED and urgent care for abdominal pain. She was seen at the ED on 3/29 for a headache after having 2 blood patches done s/p spinal steroid injection. At that time she had N/V/D. She was treated for the headache and discharged with Compazine for nausea. On 3/31, she was seen at the Urgent Care where she was thought to have a possible ileus. A CT abdomen pelvis found "Moderate to marked distention of the stomach and several distended, but not frankly dilated, loops of small bowel throughout the abdomen, suggestive of developing ileus." She was prescribed Augmentin, Zofran, and a clear liquid (advance as tolerated) diet. Today, she presents with continued abdominal discomfort but it seems to have moved to her lower abdomen. Still having some nausea but no vomiting. Her last bowel movement was Sunday. Has a poor appetite and has only been drinking fluids. She is feeling some better overall and is tolerating the Augmentin well. Still has a bit of a headache but this is being managed by neurology.  ? ?Notes that a couple of days ago, she developed a tender spot on the outer right foot. No injury or trauma. Uncomfortable to walk on it at times.  ? ?I reviewed the past medical history, family history, social history, surgical history, and allergies today and no changes were needed.  Please see the problem list section below in epic for further details. ? ? ?Physical exam:  ? ?General: Well Developed, well nourished, and in no acute distress.  ?Neuro: Alert and oriented x3.  ?HEENT: Normocephalic, atraumatic.  ?Skin: Warm and dry. ?Cardiac: Regular rate and rhythm, no murmurs rubs or gallops, no lower extremity edema.  ?Respiratory: Clear to auscultation bilaterally. Not using accessory muscles, speaking in full sentences. ?Abdomen: Soft, mild LUQ tenderness with BLQ moderate  tenderness, no rebound, nondistended. Bowel sounds + x 4 quadrants. No HSM appreciated. ?Right foot: tender, mildly swollen area to the lateral right midfoot, no bruising or significant erythema. ? ?Impression and Recommendations:   ? ?1. Ileus (HCC) ?Continue Augmentin TID as prescribed until full course is done. Gradually advance diet as tolerated with small amounts of soft, bland foods the move toward a regular diet. Reviewed strict return recommendations and symptoms to seek UC/ED evaluation for.  ? ?2. Right foot pain ?Right foot x-rays today. Recommend RICE and Tylenol as needed.  ?- DG Foot Complete Right; Future ? ?Return in about 1 week (around 07/05/2021) for abdominal pain/ileus follow up with PCP. ?___________________________________________ ?Thayer Ohm, DNP, APRN, FNP-BC ?Primary Care and Sports Medicine ?[name] ?

## 2021-06-28 ENCOUNTER — Ambulatory Visit: Payer: BC Managed Care – PPO | Admitting: Medical-Surgical

## 2021-06-28 ENCOUNTER — Ambulatory Visit (INDEPENDENT_AMBULATORY_CARE_PROVIDER_SITE_OTHER): Payer: BC Managed Care – PPO

## 2021-06-28 ENCOUNTER — Encounter: Payer: Self-pay | Admitting: Medical-Surgical

## 2021-06-28 VITALS — BP 127/80 | HR 96 | Ht 66.0 in | Wt 158.0 lb

## 2021-06-28 DIAGNOSIS — M79671 Pain in right foot: Secondary | ICD-10-CM | POA: Diagnosis not present

## 2021-06-28 DIAGNOSIS — K567 Ileus, unspecified: Secondary | ICD-10-CM

## 2021-06-28 NOTE — Progress Notes (Signed)
Pt not eating solid food. She's feeling some better.  ? ?Continued slight headache Hx of blood patch for spinal leak. ?

## 2021-07-04 ENCOUNTER — Other Ambulatory Visit: Payer: Self-pay | Admitting: Family Medicine

## 2021-07-05 ENCOUNTER — Encounter: Payer: Self-pay | Admitting: Family Medicine

## 2021-07-05 ENCOUNTER — Ambulatory Visit: Payer: BC Managed Care – PPO | Admitting: Family Medicine

## 2021-07-05 ENCOUNTER — Ambulatory Visit (INDEPENDENT_AMBULATORY_CARE_PROVIDER_SITE_OTHER): Payer: BC Managed Care – PPO | Admitting: Family Medicine

## 2021-07-05 VITALS — BP 128/85 | HR 81 | Resp 16 | Ht 66.0 in | Wt 156.0 lb

## 2021-07-05 DIAGNOSIS — H9312 Tinnitus, left ear: Secondary | ICD-10-CM | POA: Diagnosis not present

## 2021-07-05 DIAGNOSIS — H9202 Otalgia, left ear: Secondary | ICD-10-CM

## 2021-07-05 DIAGNOSIS — G8929 Other chronic pain: Secondary | ICD-10-CM | POA: Diagnosis not present

## 2021-07-05 DIAGNOSIS — R519 Headache, unspecified: Secondary | ICD-10-CM | POA: Diagnosis not present

## 2021-07-05 NOTE — Progress Notes (Signed)
? ?Established Patient Office Visit ? ?Subjective:  ?Patient ID: [name], female    DOB: 10-Jun-1965  Age: 56 y.o. MRN: [id number] ? ?CC:  ?Chief Complaint  ?Patient presents with  ? Hospitalization Follow-up  ?  For headache. Patient states she is still having a headache mainly at the base of her head. Patient also states that she has a roaring noise and pain in left ear.   ? ? ?HPI ?Carol Diaz presents for hospital follow-up.  She went to the hospital on March 29 for persistent headaches that had lasted for several weeks after having had a steroid injection in her back and had to have 2 blood patches done due to the headaches.  She denied nausea vomiting and diarrhea at that time.  They did not see any indication for further brain imaging at that point in time and felt like it was just secondary to the procedure.  Unfortunately she has continued to have a headache mainly at the base of her head.  And she is hearing a roaring noise in her left ear.  Her head hurts worse when she flexes her neck.  No fevers or chills no nausea or vomiting.  She has had some intermittent blurry vision though and has felt weak in her lower legs on and off.  She has not noticed any discrete rashes or other systemic changes.  It is now been 4 weeks since her procedure and she still waking up, and then going to bed with a headache.  It has not changed or improved. ? ?Past Medical History:  ?Diagnosis Date  ? Chronic fatigue syndrome   ? Hypertension   ? Migraines   ? Palpitations   ? Renal insufficiency   ? Sjogren's disease ([place]) 03/17/2020  ? ? ?Past Surgical History:  ?Procedure Laterality Date  ? CESAREAN SECTION    ? ivp    ? LAPAROSCOPIC APPENDECTOMY N/A 05/17/2017  ? Procedure: DIAGNOSTIC LAPAROSCOPY APPENDECTOMY;  Surgeon: Leighton Ruff, MD;  Location: [facility];  Service: General;  Laterality: N/A;  ? TUBAL LIGATION    ? ? ?Family History  ?Problem Relation Age of Onset  ? Emphysema Mother   ? Hypertension Father   ?  Rheum arthritis Sister   ? Thyroid disease Sister   ? Thyroid disease Sister   ? Diabetes Sister   ? Heart disease Brother 68  ?     mi  ? Thyroid disease Brother   ? Epilepsy Daughter   ? Healthy Daughter   ? Healthy Daughter   ? Asthma Daughter   ? ? ?Social History  ? ?Socioeconomic History  ? Marital status: Married  ?  Spouse name: Not on file  ? Number of children: 2  ? Years of education: Not on file  ? Highest education level: Not on file  ?Occupational History  ?  Employer: [employer]  ?Tobacco Use  ? Smoking status: Never  ? Smokeless tobacco: Never  ?Vaping Use  ? Vaping Use: Never used  ?Substance and Sexual Activity  ? Alcohol use: No  ? Drug use: No  ? Sexual activity: Yes  ?  Partners: Male  ?  Birth control/protection: None  ?Other Topics Concern  ? Not on file  ?Social History Narrative  ? Some exercise.   ? ?Social Determinants of Health  ? ?Financial Resource Strain: Not on file  ?Food Insecurity: Not on file  ?Transportation Needs: Not on file  ?Physical Activity: Not on file  ?  Stress: Not on file  ?Social Connections: Not on file  ?Intimate Partner Violence: Not on file  ? ? ?Outpatient Medications Prior to Visit  ?Medication Sig Dispense Refill  ? cyanocobalamin (,VITAMIN B-12,) 1000 MCG/ML injection every 30 (thirty) days.    ? cyclobenzaprine (FLEXERIL) 10 MG tablet Take 1 tablet (10 mg total) by mouth at bedtime as needed for muscle spasms. 90 tablet 1  ? DULoxetine (CYMBALTA) 60 MG capsule TAKE 1 CAPSULE(60 MG) BY MOUTH DAILY 90 capsule 1  ? fluticasone (FLONASE) 50 MCG/ACT nasal spray Place 2 sprays into both nostrils daily. (Patient taking differently: Place 2 sprays into both nostrils as needed.) 16 g 0  ? folic acid (FOLVITE) 1 MG tablet Take 1 mg by mouth daily.    ? hydrochlorothiazide (HYDRODIURIL) 12.5 MG tablet Take 12.5 mg by mouth every morning.    ? HYDROcodone-acetaminophen (NORCO) 7.5-325 MG tablet Take 1 tablet by mouth 3 (three) times daily as needed.    ?  hydroxychloroquine (PLAQUENIL) 200 MG tablet Take 200 mg by mouth 2 (two) times daily.    ? LINZESS 290 MCG CAPS capsule TAKE 1 CAPSULE(290 MCG) BY MOUTH DAILY BEFORE BREAKFAST 90 capsule 3  ? magic mouthwash SOLN SWISH AND SPIT 15MLS BY MOUTH THREE TIMES DAILY AS NEEDED    ? meloxicam (MOBIC) 15 MG tablet Take 15 mg by mouth daily.    ? omeprazole (PRILOSEC) 40 MG capsule Take 1 capsule by mouth daily.    ? ondansetron (ZOFRAN-ODT) 4 MG disintegrating tablet DISSOLVE ONE TABLET ON THE TONGUE EVERY 6 to 8 HOURS AS NEEDED FOR NAUSEA OR VOMITING 20 tablet 0  ? primidone (MYSOLINE) 50 MG tablet Take 100 mg by mouth 2 (two) times daily.    ? prochlorperazine (COMPAZINE) 10 MG tablet Take by mouth.    ? propranolol ER (INDERAL LA) 60 MG 24 hr capsule TAKE ONE CAPSULE BY MOUTH ONCE DAILY 90 capsule 0  ? SUMAtriptan (IMITREX) 100 MG tablet TAKE 1 TABLET BY MOUTH EVERY 2 HOURS AS NEEDED MIGRAINES AS DIRECTED 10 tablet 11  ? Thiamine HCl (B-1) 100 MG TABS Take 1 tablet by mouth 2 (two) times daily.    ? topiramate (TOPAMAX) 50 MG tablet Take 50 mg by mouth 2 (two) times daily.    ? traMADol (ULTRAM) 50 MG tablet Take 50 mg by mouth 3 (three) times daily as needed for moderate pain.    ? Vitamin D, Ergocalciferol, (DRISDOL) 1.25 MG (50000 UNIT) CAPS capsule Take 50,000 Units by mouth once a week.    ? amoxicillin-clavulanate (AUGMENTIN) 875-125 MG tablet Take one tab PO Q8hr. 21 tablet 0  ? ?No facility-administered medications prior to visit.  ? ? ?Allergies  ?Allergen Reactions  ? Fluoxetine Other (See Comments)  ?  Made her tremor worse ?  ? Lexapro [Escitalopram Oxalate] Other (See Comments)  ?  Headache  ? Morphine Nausea And Vomiting  ? Sertraline Other (See Comments)  ?  Headache ?  ? ? ?ROS ?Review of Systems ? ?  ?Objective:  ?  ?Physical Exam ?Constitutional:   ?   Appearance: She is well-developed.  ?HENT:  ?   Head: Normocephalic and atraumatic.  ?   Right Ear: Tympanic membrane, ear canal and external ear normal.   ?   Left Ear: Tympanic membrane, ear canal and external ear normal.  ?   Nose: Nose normal.  ?   Mouth/Throat:  ?   Mouth: Mucous membranes are moist.  ?  Pharynx: Oropharynx is clear. No oropharyngeal exudate or posterior oropharyngeal erythema.  ?Eyes:  ?   Conjunctiva/sclera: Conjunctivae normal.  ?   Pupils: Pupils are equal, round, and reactive to light.  ?Neck:  ?   Thyroid: No thyromegaly.  ?Cardiovascular:  ?   Rate and Rhythm: Normal rate and regular rhythm.  ?   Heart sounds: Normal heart sounds.  ?Pulmonary:  ?   Effort: Pulmonary effort is normal.  ?   Breath sounds: Normal breath sounds. No wheezing.  ?Musculoskeletal:  ?   Cervical back: Neck supple.  ?Lymphadenopathy:  ?   Cervical: No cervical adenopathy.  ?Skin: ?   General: Skin is warm and dry.  ?Neurological:  ?   Mental Status: She is alert and oriented to person, place, and time.  ? ? ?BP 128/85   Pulse 81   Resp 16   Ht '5\' 6"'  (1.676 m)   Wt 156 lb (70.8 kg)   LMP 04/22/2018 (Exact Date)   SpO2 99%   BMI 25.18 kg/m?  ?Wt Readings from Last 3 Encounters:  ?07/05/21 156 lb (70.8 kg)  ?06/28/21 158 lb (71.7 kg)  ?06/24/21 160 lb (72.6 kg)  ? ? ? ?There are no preventive care reminders to display for this patient. ? ?There are no preventive care reminders to display for this patient. ? ?Lab Results  ?Component Value Date  ? TSH 1.54 05/11/2021  ? ?Lab Results  ?Component Value Date  ? WBC 8.2 06/24/2021  ? HGB 12.3 06/24/2021  ? HCT 37.1 06/24/2021  ? MCV 85.5 06/24/2021  ? PLT 257 06/24/2021  ? ?Lab Results  ?Component Value Date  ? NA 139 06/24/2021  ? K CANCELED 06/24/2021  ? CO2 20 06/24/2021  ? GLUCOSE 78 06/24/2021  ? BUN 14 06/24/2021  ? CREATININE 0.86 06/24/2021  ? BILITOT 0.5 06/24/2021  ? ALKPHOS 51 09/24/2015  ? AST 30 02/11/2021  ? ALT 41 (H) 06/24/2021  ? PROT 6.2 06/24/2021  ? ALBUMIN 3.9 09/24/2015  ? CALCIUM 9.4 06/24/2021  ? EGFR 80 06/24/2021  ? GFR 64.36 05/11/2021  ? ?Lab Results  ?Component Value Date  ? CHOL 233 (H)  12/06/2020  ? ?Lab Results  ?Component Value Date  ? HDL 81 12/06/2020  ? ?Lab Results  ?Component Value Date  ? LDLCALC 127 (H) 12/06/2020  ? ?Lab Results  ?Component Value Date  ? TRIG 132 12/06/2020  ? ?Lab Resul

## 2021-07-11 ENCOUNTER — Ambulatory Visit (INDEPENDENT_AMBULATORY_CARE_PROVIDER_SITE_OTHER): Payer: BC Managed Care – PPO

## 2021-07-11 DIAGNOSIS — H9313 Tinnitus, bilateral: Secondary | ICD-10-CM | POA: Diagnosis not present

## 2021-07-11 DIAGNOSIS — H538 Other visual disturbances: Secondary | ICD-10-CM | POA: Diagnosis not present

## 2021-07-11 DIAGNOSIS — G8929 Other chronic pain: Secondary | ICD-10-CM

## 2021-07-11 DIAGNOSIS — R519 Headache, unspecified: Secondary | ICD-10-CM

## 2021-07-11 MED ORDER — GADOBUTROL 1 MMOL/ML IV SOLN
7.0000 mL | Freq: Once | INTRAVENOUS | Status: AC | PRN
  Administered 2021-07-11: 7 mL via INTRAVENOUS

## 2021-07-11 NOTE — Progress Notes (Signed)
HI Carol Diaz,  ?The good news is is that we did not see anything to suggest a stroke or mass.  But it does look like you have decreased fluid around your brain.  This makes me concerned that you are still losing spinal fluid somewhere.  You feel like you have had any leakage from the site in your back where they drew the fluid.  Have you noticed any persistent runny nose on one side of the nose?  Also please let me know who actually did your spinal injection and the blood patches so that we can contact them as well.

## 2021-07-12 NOTE — Progress Notes (Signed)
Hi Carol Diaz, if you do not mind helping me with this 1.  She just had a spinal tap with [facility] neurology and unfortunately she had a leak and they had to do a blood patch twice.  Is now been 4 weeks since the procedure and she was still having severe headaches and blurry vision and roaring in her ears so we finally did an MRI.  The MRI shows that she has intracranial hypotension.  So it appears that she is still potentially leaking fluid somewhere.  So I really like for them to get her in ASAP this week to address this.  Were happy to send over a copy of the report to them so that the physician can address it.  She may end up needing additional imaging with contrast to see where the leak which may be.  She does not feel like there is any leakage from the injection site.  She has had a runny nose.

## 2021-07-19 ENCOUNTER — Ambulatory Visit: Payer: BC Managed Care – PPO | Admitting: Internal Medicine

## 2021-07-19 ENCOUNTER — Encounter: Payer: Self-pay | Admitting: Internal Medicine

## 2021-07-19 VITALS — BP 136/90 | HR 74 | Ht 66.0 in | Wt 157.2 lb

## 2021-07-19 DIAGNOSIS — E063 Autoimmune thyroiditis: Secondary | ICD-10-CM

## 2021-07-19 DIAGNOSIS — Z8639 Personal history of other endocrine, nutritional and metabolic disease: Secondary | ICD-10-CM

## 2021-07-19 LAB — BASIC METABOLIC PANEL
BUN: 27 mg/dL — ABNORMAL HIGH (ref 6–23)
CO2: 25 mEq/L (ref 19–32)
Calcium: 10.2 mg/dL (ref 8.4–10.5)
Chloride: 107 mEq/L (ref 96–112)
Creatinine, Ser: 0.89 mg/dL (ref 0.40–1.20)
GFR: 73.03 mL/min (ref >60.00)
Glucose, Bld: 85 mg/dL (ref 70–99)
Potassium: 4.3 mEq/L (ref 3.5–5.1)
Sodium: 140 mEq/L (ref 135–145)

## 2021-07-19 LAB — TSH: TSH: 0.95 u[IU]/mL (ref 0.35–5.50)

## 2021-07-19 LAB — FOLLICLE STIMULATING HORMONE: FSH: 70.4 m[IU]/mL

## 2021-07-19 LAB — T4, FREE: Free T4: 0.66 ng/dL (ref 0.60–1.60)

## 2021-07-19 NOTE — Progress Notes (Signed)
? ? ?Name: Carol Diaz  ?MRN/ DOB: [id number], 05-25-1965    ?Age/ Sex: 56 y.o., female   ? ?PCP: Hali Marry, MD   ?Reason for Endocrinology Evaluation: Low cortisol  ?   ?Date of Initial Endocrinology Evaluation: 05/02/2021  ? ? ?HPI: ?Carol Diaz is a 56 y.o. female with a past medical history of GERD, IBS, migraine headaches, essential tremors and thiamine deficiency. The patient presented for initial endocrinology clinic visit on 05/02/2021 for consultative assistance with her low cortisol.  ? ?Patient has been referred here for further evaluation of low serum cortisol at 0.8 mcg/DL in 01/2021, this was ordered during evaluation of lower extremity weakness and facial swelling. ? ? ? ?Patient has been evaluated by rheumatology 02/2019 for polyarthralgia and sicca complex ?Patient on Plaquenil for prior diagnosis of Sjogren's syndrome ? ? ?Of note the patient had abdomen/pelvis CT 02/24/2021 which was unremarkable including adrenal glands. ? ?On her initial visit to our clinic she was accompanied by her daughter and they were endorsing symptoms of Cushing syndrome rather than low cortisol to include puffiness of the face, buffalo hump, weight gain ?The symptoms were attributed to exogenous intra-articular glucocorticoid injections, that she typically gets every 3-4 months. ? ? ?Cosyntropin stimulation test was normal 04/2021, with a cortisol level of 22.9 UG/DL @ 60 minutes ?She had a normal ACTH at 7 PG/mL, normal cortisol at 8.5 UG/DL. ? ? ?  ?In review of old lab work the patient has an elevated anti-TPO antibody  673 IU/mL , she has family history of thyroid disease, historically TFTs have been normal. ? ? ?SUBJECTIVE:  ? ? ?Today (07/19/21):  Carol Diaz for follow-up on recent abnormal pituitary imaging. ? ?Patient had an urgent care visit 06/22/2021 for headaches and after receiving a neck glucocorticoid injection ?She had another urgent care visit 06/24/2021 for nausea and abdominal pain,  was put on Augmentin ? 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 ?She is following with neurology for spinal leak ?Weight stable  ?Continues to have facial swelling  ?No LE edema  ?No change in shoe size but has noted tightness in the rings  ?Has alternating constipation nor diarrhea  ?Has been having headaches since the spinal leak  ?Weight continues to fluctuate  ? ?Denies nipple discharge  ?LMP 2 years ago  ? ? ? ? ? ? ?HISTORY:  ?Past Medical History:  ?Past Medical History:  ?Diagnosis Date  ? Chronic fatigue syndrome   ? Hypertension   ? Migraines   ? Palpitations   ? Renal insufficiency   ? Sjogren's disease ([place]) 03/17/2020  ? ?Past Surgical History:  ?Past Surgical History:  ?Procedure Laterality Date  ? CESAREAN SECTION    ? ivp    ? LAPAROSCOPIC APPENDECTOMY N/A 05/17/2017  ? Procedure: DIAGNOSTIC LAPAROSCOPY APPENDECTOMY;  Surgeon: Leighton Ruff, MD;  Location: [facility];  Service: General;  Laterality: N/A;  ? TUBAL LIGATION    ?  ?Social History:  reports that she has never smoked. She has never used smokeless tobacco. She reports that she does not drink alcohol and does not use drugs. ?Family History: family history includes Asthma in her daughter; Diabetes in her sister; Emphysema in her mother; Epilepsy in her daughter; Healthy in her daughter and daughter; Heart disease (age of  onset: 62) in her brother; Hypertension in her father; Rheum arthritis in her sister; Thyroid disease in her brother, sister, and sister. ? ? ?HOME MEDICATIONS: ?Allergies as of 07/19/2021   ? ?   Reactions  ? Fluoxetine Other (See Comments)  ? Made her tremor worse  ? Lexapro [escitalopram Oxalate] Other (See Comments)  ? Headache  ? Morphine Nausea And Vomiting  ? Sertraline Other (See Comments)  ? Headache  ? ?  ? ?  ?Medication List  ?  ? ?   ? Accurate as of July 19, 2021  7:28 AM. If you have any questions, ask your nurse or doctor.  ?  ?  ? ?  ? ?B-1 100 MG Tabs ?Take 1 tablet by mouth 2 (two) times daily. ?  ?cyanocobalamin 1000 MCG/ML injection ?Commonly known as: (VITAMIN B-12) ?every 30 (thirty) days. ?  ?cyclobenzaprine 10 MG tablet ?Commonly known as: FLEXERIL ?Take 1 tablet (10 mg total) by mouth at bedtime as needed for muscle spasms. ?  ?DULoxetine 60 MG capsule ?Commonly known as: CYMBALTA ?TAKE 1 CAPSULE(60 MG) BY MOUTH DAILY ?  ?fluticasone 50 MCG/ACT nasal spray ?Commonly known as: FLONASE ?Place 2 sprays into both nostrils daily. ?What changed:  ?when to take this ?reasons to take this ?  ?folic acid 1 MG tablet ?Commonly known as: FOLVITE ?Take 1 mg by mouth daily. ?  ?hydrochlorothiazide 12.5 MG tablet ?Commonly known as: HYDRODIURIL ?Take 12.5 mg by mouth every morning. ?  ?HYDROcodone-acetaminophen 7.5-325 MG tablet ?Commonly known as: NORCO ?Take 1 tablet by mouth 3 (three) times daily as needed. ?  ?hydroxychloroquine 200 MG tablet ?Commonly known as: PLAQUENIL ?Take 200 mg by mouth 2 (two) times daily. ?  ?Linzess 290 MCG Caps capsule ?Generic drug: linaclotide ?TAKE 1 CAPSULE(290 MCG) BY MOUTH DAILY BEFORE BREAKFAST ?  ?magic mouthwash Soln ?SWISH AND SPIT 15MLS BY MOUTH THREE TIMES DAILY AS NEEDED ?  ?meloxicam 15 MG tablet ?Commonly known as: MOBIC ?Take 15 mg by mouth daily. ?  ?omeprazole 40 MG capsule ?Commonly known as: PRILOSEC ?Take 1 capsule by mouth daily. ?  ?ondansetron 4 MG disintegrating tablet ?Commonly known as: ZOFRAN-ODT ?DISSOLVE ONE TABLET ON THE TONGUE EVERY 6 to 8 HOURS AS NEEDED FOR NAUSEA OR VOMITING ?  ?primidone 50 MG tablet ?Commonly known as: MYSOLINE ?Take 100 mg by mouth 2 (two) times daily. ?  ?prochlorperazine 10 MG tablet ?Commonly known as: COMPAZINE ?Take by mouth. ?  ?propranolol ER 60 MG 24 hr capsule ?Commonly known as: INDERAL LA ?TAKE ONE CAPSULE BY MOUTH ONCE DAILY ?  ?SUMAtriptan 100 MG  tablet ?Commonly known as: IMITREX ?TAKE 1 TABLET BY MOUTH EVERY 2 HOURS AS NEEDED MIGRAINES AS DIRECTED ?  ?topiramate 50 MG tablet ?Commonly known as: TOPAMAX ?Take 50 mg by mouth 2 (two) times daily. ?  ?traMADol 50 MG tablet ?Commonly known as: ULTRAM ?Take 50 mg by mouth 3 (three) times daily as needed for moderate pain. ?  ?Vitamin D (Ergocalciferol) 1.25 MG (50000 UNIT) Caps capsule ?Commonly known as: DRISDOL ?Take 50,000 Units by mouth once a week. ?  ? ?  ?  ? ? ?REVIEW OF SYSTEMS: ?A comprehensive ROS was conducted with the patient and is negative except as per HPI ? ? ? ?OBJECTIVE:  ?VS: BP 136/90   Pulse 74   Ht 5\' 6"  (1.676 m)   Wt 157 lb 3.2 oz (71.3 kg)   LMP 04/22/2018 (Exact Date)   SpO2 99%   BMI 25.37  kg/m?  ? ? ?Wt Readings from Last 3 Encounters:  ?07/05/21 156 lb (70.8 kg)  ?06/28/21 158 lb (71.7 kg)  ?06/24/21 160 lb (72.6 kg)  ? ? ? ?EXAM: ?General: Pt appears well and is in NAD ?Patient with head tremors  ?Eyes: External eye exam normal without stare, lid lag or exophthalmos.  EOM intact.  PERRL.  ?Neck: General: Supple without adenopathy. ?Thyroid: Thyroid size normal.  No goiter or nodules appreciated.   ?Lungs: Clear with good BS bilat with no rales, rhonchi, or wheezes  ?Heart: Auscultation: RRR.  ?Abdomen: Normoactive bowel sounds, soft, nontender, without masses or organomegaly palpable  ?Extremities:  ?BL LE: No pretibial edema normal ROM and strength.  ?Mental Status: Judgment, insight: Intact ?Orientation: Oriented to time, place, and person ?Mood and affect: No depression, anxiety, or agitation  ? ? ? ?DATA REVIEWED: ? ? ? Latest Reference Range & Units 07/19/21 12:18  ?FSH mIU/ML 70.4  ?Prolactin ng/mL 16.6  ?Glucose 70 - 99 mg/dL 85  ?Estradiol pg/mL <15  ?TSH 0.35 - 5.50 uIU/mL 0.95  ?T4,Free(Direct) 0.60 - 1.60 ng/dL 0.66  ? ? ?Cosyntropin stimulation test 05/11/2021 ? ? Latest Reference Range & Units 05/11/21 08:21 05/11/21 08:55 05/11/21 09:26  ?Cortisol, Plasma ug/dL  8.5 18.9 22.9  ? ? ? ? ? 12/24/2019 ?Sjogren's Ab 2.6 ( <1.0)  ?Anti-DS IgG 25.99 ?ANA 10.42 (<20)  ?RF IgM 5.5 ( <3.5)  ?Ant-TPO 673 IU/mL  ? ? ?Brain MRI 07/11/2021 ? ?Brain: No restricted diffusion to sugge

## 2021-07-27 LAB — PROLACTIN: Prolactin: 16.6 ng/mL

## 2021-07-27 LAB — INSULIN-LIKE GROWTH FACTOR
IGF-I, LC/MS: 288 ng/mL (ref 50–317)
Z-Score (Female): 1.7 SD (ref ?–2.0)

## 2021-07-27 LAB — ESTRADIOL: Estradiol: <15 pg/mL

## 2021-08-11 ENCOUNTER — Ambulatory Visit: Payer: BC Managed Care – PPO | Admitting: Family Medicine

## 2021-08-11 ENCOUNTER — Encounter: Payer: Self-pay | Admitting: Family Medicine

## 2021-08-11 VITALS — BP 130/82 | HR 75 | Resp 16 | Ht 66.0 in | Wt 161.0 lb

## 2021-08-11 DIAGNOSIS — J0100 Acute maxillary sinusitis, unspecified: Secondary | ICD-10-CM

## 2021-08-11 DIAGNOSIS — R051 Acute cough: Secondary | ICD-10-CM | POA: Diagnosis not present

## 2021-08-11 MED ORDER — CEFDINIR 300 MG PO CAPS: 300.0000 mg | ORAL_CAPSULE | Freq: Two times a day (BID) | ORAL | 0 refills | Status: DC

## 2021-08-11 NOTE — Progress Notes (Signed)
Acute Office Visit  Subjective:     Patient ID: Carol Diaz, female    DOB: Sep 12, 1965, 56 y.o.   MRN: [id number]  Chief Complaint  Patient presents with   Headache    Productive cough, facial pressure pain 5 days.    HPI Patient is in today for sinus sxs for about 5 to 6 days.  She has had frontal headaches and left-sided facial maxillary pain.  No fevers or chills.  She has had some yellow mucus from coughing as well.  No GI symptoms.  She also feels me in about these spinal fluid leak.  She had been experiencing a clear runny nose on the right side and so her pain management to originally done the epidural has referred her to ENT but she cannot get in until June 14.  She has been referred to [facility] ear nose and throat.  They have been calling to try to get in a little sooner.  He also tore some skin from her right forearm.  It caught on the chain on a swing.  She put a waterproof bandage on it when she pulled it off and actually tore the skin even more.  ROS      Objective:    BP 130/82   Pulse 75   Resp 16   Ht 5\' 6"  (1.676 m)   Wt 161 lb (73 kg)   LMP 04/22/2018 (Exact Date)   SpO2 99%   BMI 25.99 kg/m    Physical Exam Constitutional:      Appearance: She is well-developed.  HENT:     Head: Normocephalic and atraumatic.     Comments: Left maxillary swelling on the left side.  A little swelling underneath the left eye as well.    Right Ear: External ear normal.     Left Ear: External ear normal.     Nose: Nose normal.  Eyes:     Conjunctiva/sclera: Conjunctivae normal.     Pupils: Pupils are equal, round, and reactive to light.  Neck:     Thyroid: No thyromegaly.  Cardiovascular:     Rate and Rhythm: Normal rate and regular rhythm.     Heart sounds: Normal heart sounds.  Pulmonary:     Effort: Pulmonary effort is normal.     Breath sounds: Normal breath sounds. No wheezing.  Musculoskeletal:     Cervical back: Neck supple.  Lymphadenopathy:      Cervical: No cervical adenopathy.  Skin:    General: Skin is warm and dry.  Neurological:     Mental Status: She is alert and oriented to person, place, and time.    No results found for any visits on 08/11/21.      Assessment & Plan:   Problem List Items Addressed This Visit   None Visit Diagnoses     Acute non-recurrent maxillary sinusitis    -  Primary   Relevant Medications   cefdinir (OMNICEF) 300 MG capsule   Acute cough          Acute sinusitis -treat with Omnicef.  If not better in 1 week then please let me know.  Okay to continue symptomatic care.  Abrasion-encouraged her to discontinue the antibiotic ointment and switch to Vaseline.  Keep it loosely covered she can use something like Coban that will not stick to the skin.  Cerebral spinal fluid leak-we will see if we can help her get into 08/13/21 ear nose and throat maybe a little bit more quickly.  Meds ordered this encounter  Medications   cefdinir (OMNICEF) 300 MG capsule    Sig: Take 1 capsule (300 mg total) by mouth 2 (two) times daily.    Dispense:  20 capsule    Refill:  0    No follow-ups on file.  Nani Gasser, MD

## 2021-08-22 ENCOUNTER — Encounter: Payer: Self-pay | Admitting: Family Medicine

## 2021-08-23 MED ORDER — FLUCONAZOLE 150 MG PO TABS: 150.0000 mg | ORAL_TABLET | Freq: Once | ORAL | 0 refills | Status: AC

## 2021-08-23 NOTE — Telephone Encounter (Signed)
Meds ordered this encounter  Medications   fluconazole (DIFLUCAN) 150 MG tablet    Sig: Take 1 tablet (150 mg total) by mouth once for 1 dose.    Dispense:  1 tablet    Refill:  0    

## 2021-09-03 ENCOUNTER — Other Ambulatory Visit: Payer: Self-pay

## 2021-09-03 ENCOUNTER — Emergency Department (HOSPITAL_BASED_OUTPATIENT_CLINIC_OR_DEPARTMENT_OTHER)
Admission: EM | Admit: 2021-09-03 | Discharge: 2021-09-04 | Disposition: A | Discharge status: Home / Self Care | Payer: BC Managed Care – PPO | Attending: Emergency Medicine | Admitting: Emergency Medicine

## 2021-09-03 ENCOUNTER — Encounter (HOSPITAL_BASED_OUTPATIENT_CLINIC_OR_DEPARTMENT_OTHER): Payer: Self-pay | Admitting: Emergency Medicine

## 2021-09-03 DIAGNOSIS — R197 Diarrhea, unspecified: Secondary | ICD-10-CM | POA: Diagnosis not present

## 2021-09-03 DIAGNOSIS — R112 Nausea with vomiting, unspecified: Secondary | ICD-10-CM | POA: Diagnosis not present

## 2021-09-03 DIAGNOSIS — E876 Hypokalemia: Secondary | ICD-10-CM | POA: Insufficient documentation

## 2021-09-03 DIAGNOSIS — I10 Essential (primary) hypertension: Secondary | ICD-10-CM | POA: Diagnosis not present

## 2021-09-03 DIAGNOSIS — R519 Headache, unspecified: Secondary | ICD-10-CM | POA: Diagnosis not present

## 2021-09-03 DIAGNOSIS — Z79899 Other long term (current) drug therapy: Secondary | ICD-10-CM | POA: Insufficient documentation

## 2021-09-03 LAB — CBC WITH DIFFERENTIAL/PLATELET
Abs Immature Granulocytes: 0.02 10*3/uL (ref 0.00–0.07)
Basophils Absolute: 0 10*3/uL (ref 0.0–0.1)
Basophils Relative: 0 %
Eosinophils Absolute: 0 10*3/uL (ref 0.0–0.5)
Eosinophils Relative: 1 %
HCT: 38.8 % (ref 36.0–46.0)
Hemoglobin: 12.8 g/dL (ref 12.0–15.0)
Immature Granulocytes: 0 %
Lymphocytes Relative: 30 %
Lymphs Abs: 2.1 10*3/uL (ref 0.7–4.0)
MCH: 28.5 pg (ref 26.0–34.0)
MCHC: 33 g/dL (ref 30.0–36.0)
MCV: 86.4 fL (ref 80.0–100.0)
Monocytes Absolute: 1 10*3/uL (ref 0.1–1.0)
Monocytes Relative: 15 %
Neutro Abs: 3.7 10*3/uL (ref 1.7–7.7)
Neutrophils Relative %: 54 %
Platelets: 282 10*3/uL (ref 150–400)
RBC: 4.49 MIL/uL (ref 3.87–5.11)
RDW: 15.3 % (ref 11.5–15.5)
WBC: 6.8 10*3/uL (ref 4.0–10.5)
nRBC: 0 % (ref 0.0–0.2)

## 2021-09-03 MED ORDER — SODIUM CHLORIDE 0.9 % IV BOLUS
1000.0000 mL | Freq: Once | INTRAVENOUS | Status: AC
Start: 2021-09-03 — End: 2021-09-04
  Administered 2021-09-03: 1000 mL via INTRAVENOUS

## 2021-09-03 MED ORDER — DIPHENHYDRAMINE HCL 50 MG/ML IJ SOLN
25.0000 mg | Freq: Once | INTRAMUSCULAR | Status: AC
  Administered 2021-09-03: 25 mg via INTRAVENOUS
  Filled 2021-09-03: qty 1

## 2021-09-03 MED ORDER — BUTORPHANOL TARTRATE 2 MG/ML IJ SOLN
2.0000 mg | Freq: Once | INTRAMUSCULAR | Status: AC
  Administered 2021-09-03: 2 mg via INTRAVENOUS
  Filled 2021-09-03: qty 1

## 2021-09-03 MED ORDER — METOCLOPRAMIDE HCL 5 MG/ML IJ SOLN
10.0000 mg | Freq: Once | INTRAMUSCULAR | Status: AC
  Administered 2021-09-03: 10 mg via INTRAVENOUS
  Filled 2021-09-03: qty 2

## 2021-09-03 NOTE — ED Provider Notes (Addendum)
[facility] Provider Note: Georgena Spurling, MD, FACEP  CSN: [id number] MRN: [id number] ARRIVAL: 09/03/21 at 2243 ROOM: [place]  Headache   HISTORY OF PRESENT ILLNESS  09/03/21 10:57 PM Carol Diaz is a 56 y.o. female who receives epidural steroid injections every 3 months for spinal stenosis.  She had an epidural steroid injection in her back in March of this year that resulted in a CSF leak requiring 2 blood patches.  She had been unable to work since then until this week when she started working part-time again.  She has also been having persistent clear rhinorrhea and there is a question about a persistent CSF leak but it is unclear how this would be related to an epidural steroid injection in her back.  She is scheduled to see an ENT regarding this next week.  An MRI 07/11/2021 showed findings concerning for intracranial hypotension.    She is here with nausea, vomiting, diarrhea and general malaise since yesterday and a headache and low back pain that began today.  She describes the pain as a burning pain and rates it as a 9 out of 10.  It is not worse with movement of her neck and she is able to move her neck.  She has not had a fever of which she is aware.   Past Medical History:  Diagnosis Date   Chronic fatigue syndrome    Hypertension    Migraines    Palpitations    Renal insufficiency    Sjogren's disease ([place]) 03/17/2020    Past Surgical History:  Procedure Laterality Date   CESAREAN SECTION     ivp     LAPAROSCOPIC APPENDECTOMY N/A 05/17/2017   Procedure: DIAGNOSTIC LAPAROSCOPY APPENDECTOMY;  Surgeon: Leighton Ruff, MD;  Location: [facility];  Service: General;  Laterality: N/A;   TUBAL LIGATION      Family History  Problem Relation Age of Onset   Emphysema Mother    Hypertension Father    Rheum arthritis Sister    Thyroid disease Sister    Thyroid disease Sister    Diabetes Sister    Heart disease Brother 4       mi    Thyroid disease Brother    Epilepsy Daughter    Healthy Daughter    Healthy Daughter    Asthma Daughter     Social History   Tobacco Use   Smoking status: Never   Smokeless tobacco: Never  Vaping Use   Vaping Use: Never used  Substance Use Topics   Alcohol use: No   Drug use: No    Prior to Admission medications   Medication Sig Start Date End Date Taking? Authorizing Provider  ondansetron (ZOFRAN) 8 MG tablet Take 1 tablet (8 mg total) by mouth every 8 (eight) hours as needed for nausea or vomiting. 09/04/21  Yes Kemari Mares, MD  cyanocobalamin (,VITAMIN B-12,) 1000 MCG/ML injection every 30 (thirty) days. 08/06/19   [provider]  DULoxetine (CYMBALTA) 60 MG capsule TAKE 1 CAPSULE(60 MG) BY MOUTH DAILY 11/25/20   Hali Marry, MD  fluticasone (FLONASE) 50 MCG/ACT nasal spray Place 2 sprays into both nostrils daily. Patient taking differently: Place 2 sprays into both nostrils as needed. 01/24/21   Couture, Cortni S, PA-C  gabapentin (NEURONTIN) 300 MG capsule Take 300 mg by mouth 3 (three) times daily. 08/08/21   [provider]  hydrochlorothiazide (HYDRODIURIL) 12.5 MG tablet Take 12.5 mg by mouth every morning. 09/14/20  [provider]  HYDROcodone-acetaminophen (NORCO) 7.5-325 MG tablet Take 1 tablet by mouth 3 (three) times daily as needed. 06/09/21   [provider]  hydroxychloroquine (PLAQUENIL) 200 MG tablet Take 200 mg by mouth 2 (two) times daily. 06/18/20   [provider]  LINZESS 290 MCG CAPS capsule TAKE 1 CAPSULE(290 MCG) BY MOUTH DAILY BEFORE BREAKFAST 01/12/21   Hali Marry, MD  prochlorperazine (COMPAZINE) 10 MG tablet Take by mouth. 06/22/21   [provider]  propranolol ER (INDERAL LA) 60 MG 24 hr capsule TAKE ONE CAPSULE BY MOUTH ONCE DAILY 06/10/21   Hali Marry, MD  SUMAtriptan (IMITREX) 100 MG tablet TAKE 1 TABLET BY MOUTH EVERY 2 HOURS AS NEEDED MIGRAINES AS DIRECTED 07/19/20    Hali Marry, MD  topiramate (TOPAMAX) 50 MG tablet Take 50 mg by mouth 2 (two) times daily. 08/07/20   Hali Marry, MD  Vitamin D, Ergocalciferol, (DRISDOL) 1.25 MG (50000 UNIT) CAPS capsule Take 50,000 Units by mouth once a week. 07/16/19   [provider]    Allergies Fluoxetine, Lexapro [escitalopram oxalate], Morphine, and Sertraline   REVIEW OF SYSTEMS  Negative except as noted here or in the History of Present Illness.   PHYSICAL EXAMINATION  Initial Vital Signs Blood pressure (!) 154/102, pulse 98, temperature 98.5 F (36.9 C), temperature source Oral, resp. rate 18, height 5\' 6"  (1.676 m), weight 73 kg, last menstrual period 04/15/2018, SpO2 100 %.  Examination General: Well-developed, well-nourished female in no acute distress; appearance consistent with age of record HENT: normocephalic; atraumatic Eyes: pupils equal, round and reactive to light; extraocular muscles intact Neck: supple; no meningeal signs Heart: regular rate and rhythm Lungs: clear to auscultation bilaterally Abdomen: soft; nondistended; nontender; bowel sounds present Extremities: No deformity; full range of motion; pulses normal Neurologic: Awake, alert and oriented; motor function intact in all extremities and symmetric; no facial droop Skin: Warm and dry Psychiatric: Flat affect   RESULTS  Summary of this visit's results, reviewed and interpreted by myself:   EKG Interpretation  Date/Time:    Ventricular Rate:    PR Interval:    QRS Duration:   QT Interval:    QTC Calculation:   R Axis:     Text Interpretation:         Laboratory Studies: Results for orders placed or performed during the hospital encounter of 09/03/21 (from the past 24 hour(s))  CBC with Differential     Status: None   Collection Time: 09/03/21 11:20 PM  Result Value Ref Range   WBC 6.8 4.0 - 10.5 K/uL   RBC 4.49 3.87 - 5.11 MIL/uL   Hemoglobin 12.8 12.0 - 15.0 g/dL   HCT 38.8 36.0 -  46.0 %   MCV 86.4 80.0 - 100.0 fL   MCH 28.5 26.0 - 34.0 pg   MCHC 33.0 30.0 - 36.0 g/dL   RDW 15.3 11.5 - 15.5 %   Platelets 282 150 - 400 K/uL   nRBC 0.0 0.0 - 0.2 %   Neutrophils Relative % 54 %   Neutro Abs 3.7 1.7 - 7.7 K/uL   Lymphocytes Relative 30 %   Lymphs Abs 2.1 0.7 - 4.0 K/uL   Monocytes Relative 15 %   Monocytes Absolute 1.0 0.1 - 1.0 K/uL   Eosinophils Relative 1 %   Eosinophils Absolute 0.0 0.0 - 0.5 K/uL   Basophils Relative 0 %   Basophils Absolute 0.0 0.0 - 0.1 K/uL   Immature Granulocytes 0 %  Abs Immature Granulocytes 0.02 0.00 - 0.07 K/uL  Comprehensive metabolic panel     Status: Abnormal   Collection Time: 09/03/21 11:20 PM  Result Value Ref Range   Sodium 139 135 - 145 mmol/L   Potassium 2.7 (LL) 3.5 - 5.1 mmol/L   Chloride 102 98 - 111 mmol/L   CO2 27 22 - 32 mmol/L   Glucose, Bld 85 70 - 99 mg/dL   BUN 16 6 - 20 mg/dL   Creatinine, Ser 1.07 (H) 0.44 - 1.00 mg/dL   Calcium 9.5 8.9 - 10.3 mg/dL   Total Protein 7.0 6.5 - 8.1 g/dL   Albumin 3.6 3.5 - 5.0 g/dL   AST 30 15 - 41 U/L   ALT 33 0 - 44 U/L   Alkaline Phosphatase 94 38 - 126 U/L   Total Bilirubin 0.7 0.3 - 1.2 mg/dL   GFR, Estimated >60 >60 mL/min   Anion gap 10 5 - 15  Lipase, blood     Status: None   Collection Time: 09/03/21 11:20 PM  Result Value Ref Range   Lipase 22 11 - 51 U/L   Imaging Studies: No results found.  ED COURSE and MDM  Nursing notes, initial and subsequent vitals signs, including pulse oximetry, reviewed and interpreted by myself.  Vitals:   09/03/21 2251 09/03/21 2253 09/03/21 2355 09/04/21 0000  BP: (!) 154/102   135/73  Pulse: 98  98   Resp: 18     Temp: 98.5 F (36.9 C)     TempSrc: Oral     SpO2: 100%  99%   Weight:  73 kg    Height:  5\' 6"  (1.676 m)     Medications  potassium chloride (KLOR-CON) packet 40 mEq (has no administration in time range)  sodium chloride 0.9 % bolus 1,000 mL (0 mLs Intravenous Stopped 09/04/21 0004)  metoCLOPramide  (REGLAN) injection 10 mg (10 mg Intravenous Given 09/03/21 2317)  butorphanol (STADOL) injection 2 mg (2 mg Intravenous Given 09/03/21 2317)  diphenhydrAMINE (BENADRYL) injection 25 mg (25 mg Intravenous Given 09/03/21 2331)  sodium chloride 0.9 % bolus 1,000 mL (1,000 mLs Intravenous New Bag/Given 09/04/21 0026)   12:15 AM Patient had akathisia after being given Reglan.  This was resolved with IV Benadryl.  She is now feeling better with relief of her headache and nausea.  1:01 AM Patient feeling better, states she is ready to go home after second liter of normal saline.  As noted above she has follow-up with ENT later this week.  1:16 AM Will initiate potassium repletion.   PROCEDURES  Procedures   ED DIAGNOSES     ICD-10-CM   1. Nausea, vomiting and diarrhea  R11.2    R19.7     2. Bad headache  R51.9     3. Hypokalemia due to excessive gastrointestinal loss of potassium  E87.6          Tashaun Obey, MD 09/04/21 0101    Shanon Rosser, MD 09/04/21 [phone]

## 2021-09-03 NOTE — ED Triage Notes (Signed)
Reports headache since having a spinal leak in march.  Had two blood patches.  Was told she had a leak in her sinuses but couldn't get in until this Wednesday.  Is continuing to have fluid leaking from her nose.  Also reports low back pain and abdominal pain with n/v/d.

## 2021-09-03 NOTE — ED Notes (Signed)
Patient c/o reaction to reglan.  MD Molpus notified, orders pending.

## 2021-09-04 LAB — COMPREHENSIVE METABOLIC PANEL
ALT: 33 U/L (ref 0–44)
AST: 30 U/L (ref 15–41)
Albumin: 3.6 g/dL (ref 3.5–5.0)
Alkaline Phosphatase: 94 U/L (ref 38–126)
Anion gap: 10 (ref 5–15)
BUN: 16 mg/dL (ref 6–20)
CO2: 27 mmol/L (ref 22–32)
Calcium: 9.5 mg/dL (ref 8.9–10.3)
Chloride: 102 mmol/L (ref 98–111)
Creatinine, Ser: 1.07 mg/dL — ABNORMAL HIGH (ref 0.44–1.00)
GFR, Estimated: >60 mL/min (ref >60)
Glucose, Bld: 85 mg/dL (ref 70–99)
Potassium: 2.7 mmol/L — CL (ref 3.5–5.1)
Sodium: 139 mmol/L (ref 135–145)
Total Bilirubin: 0.7 mg/dL (ref 0.3–1.2)
Total Protein: 7 g/dL (ref 6.5–8.1)

## 2021-09-04 LAB — LIPASE, BLOOD: Lipase: 22 U/L (ref 11–51)

## 2021-09-04 MED ORDER — POTASSIUM CHLORIDE CRYS ER 20 MEQ PO TBCR: 20.0000 meq | EXTENDED_RELEASE_TABLET | Freq: Two times a day (BID) | ORAL | 0 refills | Status: AC

## 2021-09-04 MED ORDER — ONDANSETRON HCL 8 MG PO TABS: 8.0000 mg | ORAL_TABLET | Freq: Three times a day (TID) | ORAL | 0 refills | Status: AC | PRN

## 2021-09-04 MED ORDER — POTASSIUM CHLORIDE 20 MEQ PO PACK
40.0000 meq | PACK | Freq: Every day | ORAL | Status: DC
  Administered 2021-09-04: 40 meq via ORAL
  Filled 2021-09-04: qty 2

## 2021-09-04 MED ORDER — SODIUM CHLORIDE 0.9 % IV BOLUS
1000.0000 mL | Freq: Once | INTRAVENOUS | Status: AC
  Administered 2021-09-04: 1000 mL via INTRAVENOUS

## 2021-09-05 ENCOUNTER — Telehealth: Payer: Self-pay | Admitting: General Practice

## 2021-09-05 NOTE — Telephone Encounter (Signed)
Transition Care Management Follow-up Telephone Call Date of discharge and from where: 09/04/21 from [facility] How have you been since you were released from the hospital? Doing better. Still has some nausea.  Any questions or concerns? No  Items Reviewed: Did the pt receive and understand the discharge instructions provided? Yes  Medications obtained and verified? No  Other? No  Any new allergies since your discharge? No  Dietary orders reviewed? Yes Do you have support at home? Yes   Home Care and Equipment/Supplies: Were home health services ordered? no  Functional Questionnaire: (I = Independent and D = Dependent) ADLs: I  Bathing/Dressing- I  Meal Prep- I  Eating- I  Maintaining continence- I  Transferring/Ambulation- I  Managing Meds- I  Follow up appointments reviewed:  PCP Hospital f/u appt confirmed? No  Patient will call back to schedule an appointment. [facility] f/u appt confirmed? No   Are transportation arrangements needed? No  If their condition worsens, is the pt aware to call PCP or go to the Emergency Dept.? Yes Was the patient provided with contact information for the PCP's office or ED? Yes Was to pt encouraged to call back with questions or concerns? Yes

## 2021-09-12 ENCOUNTER — Other Ambulatory Visit: Payer: Self-pay | Admitting: Family Medicine

## 2021-09-13 MED ORDER — PROPRANOLOL HCL ER 60 MG PO CP24: 60.0000 mg | ORAL_CAPSULE | Freq: Every day | ORAL | 0 refills | Status: DC

## 2021-09-23 ENCOUNTER — Encounter: Payer: Self-pay | Admitting: Family Medicine

## 2021-09-26 ENCOUNTER — Other Ambulatory Visit: Payer: Self-pay | Admitting: Family Medicine

## 2021-09-26 MED ORDER — SUMATRIPTAN SUCCINATE 100 MG PO TABS: ORAL_TABLET | ORAL | 5 refills | Status: AC

## 2021-09-28 MED ORDER — TOPIRAMATE 50 MG PO TABS: 50.0000 mg | ORAL_TABLET | Freq: Two times a day (BID) | ORAL | 0 refills | Status: DC

## 2021-10-10 ENCOUNTER — Other Ambulatory Visit: Payer: Self-pay | Admitting: Family Medicine

## 2021-10-11 ENCOUNTER — Encounter: Payer: Self-pay | Admitting: Family Medicine

## 2021-11-04 ENCOUNTER — Other Ambulatory Visit: Payer: Self-pay | Admitting: Family Medicine

## 2021-11-07 MED ORDER — TOPIRAMATE 50 MG PO TABS: 50.0000 mg | ORAL_TABLET | Freq: Two times a day (BID) | ORAL | 0 refills | Status: DC

## 2021-11-24 ENCOUNTER — Other Ambulatory Visit: Payer: Self-pay | Admitting: Family Medicine

## 2021-11-25 ENCOUNTER — Other Ambulatory Visit: Payer: Self-pay | Admitting: Family Medicine

## 2021-11-25 MED ORDER — TOPIRAMATE 50 MG PO TABS
50.0000 mg | ORAL_TABLET | Freq: Two times a day (BID) | ORAL | 0 refills | Status: DC
Start: 2021-11-25 — End: 2022-03-10

## 2021-11-25 NOTE — Telephone Encounter (Signed)
Last office visit 08/11/2021  Last filled 11/07/2021

## 2021-12-13 ENCOUNTER — Other Ambulatory Visit: Payer: Self-pay | Admitting: Family Medicine

## 2021-12-14 MED ORDER — PROPRANOLOL HCL ER 60 MG PO CP24: 60.0000 mg | ORAL_CAPSULE | Freq: Every day | ORAL | 0 refills | Status: DC

## 2022-01-03 ENCOUNTER — Ambulatory Visit: Payer: BC Managed Care – PPO | Admitting: Family Medicine

## 2022-01-03 VITALS — BP 115/63 | HR 83 | Ht 66.0 in | Wt 154.8 lb

## 2022-01-03 DIAGNOSIS — R351 Nocturia: Secondary | ICD-10-CM

## 2022-01-03 DIAGNOSIS — R7989 Other specified abnormal findings of blood chemistry: Secondary | ICD-10-CM

## 2022-01-03 DIAGNOSIS — R809 Proteinuria, unspecified: Secondary | ICD-10-CM | POA: Diagnosis not present

## 2022-01-03 DIAGNOSIS — Z23 Encounter for immunization: Secondary | ICD-10-CM

## 2022-01-03 DIAGNOSIS — L502 Urticaria due to cold and heat: Secondary | ICD-10-CM | POA: Diagnosis not present

## 2022-01-03 LAB — POCT URINALYSIS DIP (CLINITEK)
Glucose, UA: NEGATIVE mg/dL
Ketones, POC UA: NEGATIVE mg/dL
Leukocytes, UA: NEGATIVE
Nitrite, UA: NEGATIVE
POC PROTEIN,UA: 100 — AB
Spec Grav, UA: >=1.03 — AB (ref 1.010–1.025)
Urobilinogen, UA: 1 E.U./dL
pH, UA: 6 (ref 5.0–8.0)

## 2022-01-03 LAB — POCT UA - MICROALBUMIN
Creatinine, POC: 300 mg/dL
Microalbumin Ur, POC: 150 mg/L

## 2022-01-03 NOTE — Assessment & Plan Note (Signed)
Discussed heat induced urticaria.  Try to stay cool.  There really is no other significant treatment.  Think that is what she has even though she does not necessarily break out in a rash but she breaks out with itching.

## 2022-01-03 NOTE — Progress Notes (Signed)
Established Patient Office Visit  Subjective   Patient ID: Carol Diaz, female    DOB: [birth year]  Age: 56 y.o. MRN: [id number]  Chief Complaint  Patient presents with   abnormal urine test     Elevated creatinine (>300) on recent drug screen with Dr. Marlane Hatcher- pt has noticed nocturia for the past week - getting up 2 to 3 times per night  to urinate. Patient states she does have known chronic kidney disease    HPI  She is here today to follow-up on an abnormal urine.  At work they did a random urine drug screen and it did show some high levels of protein in the urine so they encouraged her to come and get it checked out.  She has a known history of CKD 2.  Last renal function that I was able to find was done during the hospital encounter in June so about 4 months ago at that time her kidney function was 1.0.  Normally she is around 0.8-0.9.  Last time we checked for protein per se was in 2019 and her urine microalbumin ratio was less than 30 at that time.  He also did a regular urinalysis about 9 months ago and that was negative for protein at the time.  She has not noticed any blood in the urine.  No recent changes.  She has been on some prednisone for a flare with her Sjogren's.  Has been getting up 2-3 times at night to urinate over the last couple of weeks.  But no dysuria and no hematuria.  He also reports episodes of feeling like her skin gets really itchy especially when she gets hot.    ROS    Objective:     BP 115/63   Pulse 83   Ht 5\' 6"  (1.676 m)   Wt 154 lb 12 oz (70.2 kg)   LMP 04/15/2018   SpO2 100%   BMI 24.98 kg/m    Physical Exam   Results for orders placed or performed in visit on 01/03/22  POCT URINALYSIS DIP (CLINITEK)  Result Value Ref Range   Color, UA yellow yellow   Clarity, UA cloudy (A) clear   Glucose, UA negative negative mg/dL   Bilirubin, UA small (A) negative   Ketones, POC UA negative negative mg/dL   Spec Grav, UA >=1.030 (A)  1.010 - 1.025   Blood, UA trace-intact (A) negative   pH, UA 6.0 5.0 - 8.0   POC PROTEIN,UA =100 (A) negative, trace   Urobilinogen, UA 1.0 0.2 or 1.0 E.U./dL   Nitrite, UA Negative Negative   Leukocytes, UA Negative Negative  POCT UA - Microalbumin  Result Value Ref Range   Microalbumin Ur, POC 150 mg/L   Creatinine, POC 300 mg/dL   Albumin/Creatinine Ratio, Urine, POC 30-300mg /g       The 10-year ASCVD risk score (Arnett DK, et al., 2019) is: 1.8%    Assessment & Plan:   Problem List Items Addressed This Visit       Musculoskeletal and Integument   Urticaria due to heat - Primary    Discussed heat induced urticaria.  Try to stay cool.  There really is no other significant treatment.  Think that is what she has even though she does not necessarily break out in a rash but she breaks out with itching.      Relevant Orders   Protein Electrophoresis, with Total Protein and Reflex to IFE, Urine   Protein electrophoresis, serum  ANA   C-reactive protein   Sedimentation rate   CBC with Differential/Platelet   Urine Microalbumin w/creat. ratio   POCT URINALYSIS DIP (CLINITEK) (Completed)   BASIC METABOLIC PANEL WITH GFR   Other Visit Diagnoses     Urine test positive for microalbuminuria       Relevant Orders   Protein Electrophoresis, with Total Protein and Reflex to IFE, Urine   Protein electrophoresis, serum   ANA   C-reactive protein   Sedimentation rate   CBC with Differential/Platelet   Urine Microalbumin w/creat. ratio   POCT URINALYSIS DIP (CLINITEK) (Completed)   BASIC METABOLIC PANEL WITH GFR   POCT UA - Microalbumin (Completed)   Need for immunization against influenza       Relevant Orders   Flu Vaccine QUAD 16mo+IM (Fluarix, Fluzone & Alfiuria Quad PF) (Completed)   Nocturia          Urine test positive for microalbuminuria-its not 30-300 range.  We will get some additional labs, recheck renal function.  She actually had an abdominal ultrasound about  a year and a half ago which looked reassuring in regards to her kidneys.  She does have Sjogren's which is a risk factor.  Currently on prednisone so working to repeat the urine microalbumin test in a week and see if the proteinuria resolves.   Return if symptoms worsen or fail to improve.    Beatrice Lecher, MD

## 2022-01-08 ENCOUNTER — Other Ambulatory Visit: Payer: Self-pay | Admitting: Family Medicine

## 2022-01-10 NOTE — Progress Notes (Signed)
Carol Diaz, I am still waiting for some of your labs to come back but the kidney function has jumped up even more from 1.0-1.2 the you look a little bit more dry on your blood work sound like to go ahead and get an ultrasound of your kidneys and the you had a CAT scan earlier than in the year.

## 2022-01-10 NOTE — Addendum Note (Signed)
Addended by: Beatrice Lecher D on: 01/10/2022 12:47 PM   Modules accepted: Orders

## 2022-01-11 ENCOUNTER — Other Ambulatory Visit: Payer: Self-pay | Admitting: Family Medicine

## 2022-01-12 ENCOUNTER — Ambulatory Visit (INDEPENDENT_AMBULATORY_CARE_PROVIDER_SITE_OTHER): Payer: BC Managed Care – PPO

## 2022-01-12 DIAGNOSIS — R7989 Other specified abnormal findings of blood chemistry: Secondary | ICD-10-CM | POA: Diagnosis not present

## 2022-01-12 LAB — BASIC METABOLIC PANEL WITH GFR
BUN/Creatinine Ratio: 22 (calc) (ref 6–22)
BUN: 27 mg/dL — ABNORMAL HIGH (ref 7–25)
CO2: 28 mmol/L (ref 20–32)
Calcium: 10 mg/dL (ref 8.6–10.4)
Chloride: 106 mmol/L (ref 98–110)
Creat: 1.24 mg/dL — ABNORMAL HIGH (ref 0.50–1.03)
Glucose, Bld: 62 mg/dL — ABNORMAL LOW (ref 65–99)
Potassium: 3.7 mmol/L (ref 3.5–5.3)
Sodium: 143 mmol/L (ref 135–146)
eGFR: 51 mL/min/{1.73_m2} — ABNORMAL LOW (ref >=60)

## 2022-01-12 LAB — CBC WITH DIFFERENTIAL/PLATELET
Absolute Monocytes: 828 cells/uL (ref 200–950)
Basophils Absolute: 30 cells/uL (ref 0–200)
Basophils Relative: 0.4 %
Eosinophils Absolute: 38 cells/uL (ref 15–500)
Eosinophils Relative: 0.5 %
HCT: 39.2 % (ref 35.0–45.0)
Hemoglobin: 13 g/dL (ref 11.7–15.5)
Lymphs Abs: 2736 cells/uL (ref 850–3900)
MCH: 28.3 pg (ref 27.0–33.0)
MCHC: 33.2 g/dL (ref 32.0–36.0)
MCV: 85.2 fL (ref 80.0–100.0)
MPV: 10.2 fL (ref 7.5–12.5)
Monocytes Relative: 10.9 %
Neutro Abs: 3967 cells/uL (ref 1500–7800)
Neutrophils Relative %: 52.2 %
Platelets: 372 10*3/uL (ref 140–400)
RBC: 4.6 10*6/uL (ref 3.80–5.10)
RDW: 14.4 % (ref 11.0–15.0)
Total Lymphocyte: 36 %
WBC: 7.6 10*3/uL (ref 3.8–10.8)

## 2022-01-12 LAB — MICROALBUMIN / CREATININE URINE RATIO
Creatinine, Urine: 238 mg/dL (ref 20–275)
Microalb Creat Ratio: 4 mcg/mg creat (ref <30)
Microalb, Ur: 1 mg/dL

## 2022-01-12 LAB — PROTEIN ELECTROPHORESIS, SERUM
Albumin ELP: 3.9 g/dL (ref 3.8–4.8)
Alpha 1: 0.3 g/dL (ref 0.2–0.3)
Alpha 2: 0.7 g/dL (ref 0.5–0.9)
Beta 2: 0.4 g/dL (ref 0.2–0.5)
Beta Globulin: 0.5 g/dL (ref 0.4–0.6)
Gamma Globulin: 0.9 g/dL (ref 0.8–1.7)
Total Protein: 6.7 g/dL (ref 6.1–8.1)

## 2022-01-12 LAB — C-REACTIVE PROTEIN: CRP: 2.1 mg/L (ref <8.0)

## 2022-01-12 LAB — ANA: Anti Nuclear Antibody (ANA): NEGATIVE

## 2022-01-12 LAB — SEDIMENTATION RATE: Sed Rate: 19 mm/h (ref 0–30)

## 2022-01-12 MED ORDER — PROPRANOLOL HCL ER 60 MG PO CP24: 60.0000 mg | ORAL_CAPSULE | Freq: Every day | ORAL | 0 refills | Status: DC

## 2022-01-16 LAB — PROTEIN ELECTROPHORESIS,W/TOTAL PROTEIN AND RFLX TO IFE,URINE
Creatinine, 24H Ur: 0.86 g/(24.h) (ref 0.50–2.15)
PROTEIN/CREATININE RATIO: 0.105 mg/mg creat (ref <0.150)
PROTEIN/CREATININE RATIO: 105 mg/g creat (ref <150)
Protein, 24H Urine: 90 mg/24 h (ref 0–149)

## 2022-01-18 ENCOUNTER — Other Ambulatory Visit: Payer: Self-pay

## 2022-01-18 DIAGNOSIS — R7989 Other specified abnormal findings of blood chemistry: Secondary | ICD-10-CM

## 2022-01-18 NOTE — Progress Notes (Signed)
Juliann Pulse, I am sorry for the delay in getting back to you about your echocardiogram.  I have been out of the office.  They thought they saw a couple shadows that could be stones but they did not see any stones on your CAT scan so they think it is just an artifact.  Meaning that it really is just a shadow.  No other abnormalities which is great.  No mass or cysts.

## 2022-01-18 NOTE — Progress Notes (Signed)
Blood count looks great.  Inflammatory markers are under control.  No indication of cancer causing the changes.  No excess protein loss.  All of this is very reassuring.  So I am going to recommend that we repeat your kidney function in about 2 weeks to see if it goes back down.  Again just make sure you are hydrating well.  But if it remains up in the neck step is to refer you to nephrology.

## 2022-01-23 ENCOUNTER — Ambulatory Visit: Payer: BC Managed Care – PPO | Admitting: Internal Medicine

## 2022-03-10 ENCOUNTER — Other Ambulatory Visit: Payer: Self-pay | Admitting: Family Medicine

## 2022-03-10 MED ORDER — TOPIRAMATE 50 MG PO TABS: 50.0000 mg | ORAL_TABLET | Freq: Two times a day (BID) | ORAL | 0 refills | Status: AC

## 2022-03-18 ENCOUNTER — Other Ambulatory Visit: Payer: Self-pay | Admitting: Family Medicine

## 2022-03-21 MED ORDER — PROPRANOLOL HCL ER 60 MG PO CP24: 60.0000 mg | ORAL_CAPSULE | Freq: Every day | ORAL | 0 refills | Status: DC

## 2022-03-23 ENCOUNTER — Encounter: Payer: Self-pay | Admitting: Family Medicine

## 2022-03-23 ENCOUNTER — Ambulatory Visit: Payer: BC Managed Care – PPO | Admitting: Family Medicine

## 2022-03-23 VITALS — BP 133/74 | HR 89 | Temp 98.3°F | Ht 66.0 in | Wt 163.1 lb

## 2022-03-23 DIAGNOSIS — R600 Localized edema: Secondary | ICD-10-CM | POA: Diagnosis not present

## 2022-03-23 DIAGNOSIS — R3581 Nocturnal polyuria: Secondary | ICD-10-CM | POA: Diagnosis not present

## 2022-03-23 DIAGNOSIS — R002 Palpitations: Secondary | ICD-10-CM | POA: Diagnosis not present

## 2022-03-23 DIAGNOSIS — R0609 Other forms of dyspnea: Secondary | ICD-10-CM | POA: Insufficient documentation

## 2022-03-23 DIAGNOSIS — E860 Dehydration: Secondary | ICD-10-CM

## 2022-03-23 DIAGNOSIS — R5383 Other fatigue: Secondary | ICD-10-CM

## 2022-03-23 DIAGNOSIS — M545 Low back pain, unspecified: Secondary | ICD-10-CM | POA: Diagnosis not present

## 2022-03-23 LAB — POCT URINALYSIS DIP (CLINITEK)
Bilirubin, UA: NEGATIVE
Bilirubin, UA: NEGATIVE
Blood, UA: NEGATIVE
Glucose, UA: NEGATIVE mg/dL
Glucose, UA: NEGATIVE mg/dL
Ketones, POC UA: NEGATIVE mg/dL
Ketones, POC UA: NEGATIVE mg/dL
Leukocytes, UA: NEGATIVE
Leukocytes, UA: NEGATIVE
Nitrite, UA: NEGATIVE
Nitrite, UA: NEGATIVE
POC PROTEIN,UA: NEGATIVE
Spec Grav, UA: >=1.03 — AB (ref 1.010–1.025)
Spec Grav, UA: >=1.03 — AB (ref 1.010–1.025)
Urobilinogen, UA: 1 E.U./dL
Urobilinogen, UA: 0.2 E.U./dL
pH, UA: 6 (ref 5.0–8.0)
pH, UA: 6.5 (ref 5.0–8.0)

## 2022-03-23 NOTE — Progress Notes (Addendum)
Acute Office Visit  Subjective:     Patient ID: Carol Diaz, female    DOB: October 10, 1965, 56 y.o.   MRN: [id number]  Chief Complaint  Patient presents with   Acute Visit    Patient in office c/o weakness, pain and swelling bilateral ankles, feet , hands, difficulty sleeping - pain in back - heart flutters  x 2 to 3 weeks but worse in last few days.patient also mentions urinary frequency x couple of weeks.     HPI Presents today for an acute visit with complaint of weakness  with "no strength"  pain in mid back and legs, with finger and ankle swelling. Due to pain and restlessness, cannot sleep well. History of "heart fluttering" worse in last 3 weeks.  Symptoms have been present for 3 weeks Associated symptoms include: no recent illnesses, no fever or chills, exertional dyspnea, urinary frequency at night, sharp chest pain x 2-3 times (able to point to area on left side), worsening bilateral leg swelling (on HCTZ).  Treatments tried include : prednisone for last 3 weeks for Sjogren's. Treatment effective : has not changed her symptoms.  Sick contacts : n/a Denies new medicines.  Last labs 01/09/22: renal functions: BUN 27, Creat 1.24, GFR 51 Last CBC on 01/09/22 normal Unable to get kidney function rechecked due to Dad passing away.  Taking HCTZ ;has not missed doses.     Review of Systems  Constitutional:  Positive for malaise/fatigue. Negative for chills and fever.  HENT:  Negative for congestion.   Eyes:  Negative for blurred vision and double vision.  Respiratory:  Positive for shortness of breath (exertional for 3 weeks).   Cardiovascular:  Positive for chest pain (sharp pain on left chest: 2-3 times in 2 months), palpitations and leg swelling (has had before, on HCTZ.).  Gastrointestinal:  Negative for abdominal pain, diarrhea, nausea and vomiting.  Genitourinary:  Positive for frequency (for one month, getting up in middle of night.). Negative for dysuria.   Neurological:  Positive for tremors (provider observed- reports this is not new) and weakness.        Objective:    BP 133/74   Pulse 89   Temp 98.3 F (36.8 C)   Ht 5\' 6"  (1.676 m)   Wt 163 lb 1 oz (74 kg)   LMP 04/15/2018   SpO2 100%   BMI 26.32 kg/m    Physical Exam Vitals and nursing note reviewed.  Constitutional:      General: She is not in acute distress.    Appearance: Normal appearance.  Cardiovascular:     Rate and Rhythm: Normal rate and regular rhythm.     Pulses: Normal pulses.     Heart sounds: Normal heart sounds.  Pulmonary:     Effort: Pulmonary effort is normal. No respiratory distress.     Breath sounds: Normal breath sounds.  Abdominal:     Tenderness: There is no right CVA tenderness or left CVA tenderness.  Musculoskeletal:     Right hand: Normal range of motion. Normal strength. Normal pulse.     Left hand: Normal range of motion. Normal strength. Normal pulse.     Lumbar back: Tenderness present. No bony tenderness.     Right lower leg: No edema.     Left lower leg: No edema.     Comments: No bony tenderness spinous processes. Mid to low back muscular tenderness with palpation worse on right.   Skin:    General: Skin is warm  and dry.  Neurological:     General: No focal deficit present.     Mental Status: She is alert. Mental status is at baseline.  Psychiatric:        Mood and Affect: Mood normal.        Behavior: Behavior normal.     Results for orders placed or performed in visit on 03/23/22  POCT URINALYSIS DIP (CLINITEK)  Result Value Ref Range   Color, UA yellow yellow   Clarity, UA clear clear   Glucose, UA negative negative mg/dL   Bilirubin, UA negative negative   Ketones, POC UA negative negative mg/dL   Spec Grav, UA >=1.914 (A) 1.010 - 1.025   Blood, UA negative negative   pH, UA 6.0 5.0 - 8.0   POC PROTEIN,UA trace negative, trace   Urobilinogen, UA 1.0 0.2 or 1.0 E.U./dL   Nitrite, UA Negative Negative    Leukocytes, UA Negative Negative  POCT URINALYSIS DIP (CLINITEK)  Result Value Ref Range   Color, UA yellow yellow   Clarity, UA clear clear   Glucose, UA negative negative mg/dL   Bilirubin, UA negative negative   Ketones, POC UA negative negative mg/dL   Spec Grav, UA >=7.829 (A) 1.010 - 1.025   Blood, UA trace-intact (A) negative   pH, UA 6.5 5.0 - 8.0   POC PROTEIN,UA negative negative, trace   Urobilinogen, UA 0.2 0.2 or 1.0 E.U./dL   Nitrite, UA Negative Negative   Leukocytes, UA Negative Negative        Assessment & Plan:   Problem List Items Addressed This Visit     Palpitations with regular cardiac rhythm   Relevant Orders   TSH + free T4   Fatigue   Relevant Orders   TSH + free T4   Acute midline low back pain - Primary   Relevant Orders   Urine Culture   POCT URINALYSIS DIP (CLINITEK) (Completed)   Fluid retention in legs   Relevant Orders   Basic Metabolic Panel (BMET)   B Nat Peptide   Exertional dyspnea   Relevant Orders   B Nat Peptide   Nocturnal polyuria   Relevant Orders   HgB A1c   POCT URINALYSIS DIP (CLINITEK) (Completed)   Dehydration, mild   Relevant Orders   Basic Metabolic Panel (BMET)  Fatigue, heart palpitations, bilateral ankle swelling, exertional dyspnea, nocturnal polyuria x 3 weeks.   Vital signs stable, oxygen saturation 100%, no acute respiratory distress/no shortness of breath, able to converse with provider without difficulty.  Lungs clear, heart with regular rhythm. No CVA tenderness. No prednisone for last 3 weeks for chronic condition.  Urine in clinic with trace blood, will send for culture to rule out acute cystis.  Labs today BMP, BNP, TSH plus free T4, and hemoglobin A1c. Discussed plan of care with patient, if labs are abnormal, will determine next steps based on lab results. No history of renal stones, with hematuria, back pain, dehydration (elevated specific gravity on urine), will check renal function.  No history of  congestive heart failure, with bilateral ankle swelling, and exertional dyspnea, will check BNP. Heart palpitations, fatigue and tremor, will check TSH and free T4.  On HCTZ for swelling per PCP, reports swelling is worse. No ankle edema today, worse as day goes on.  No history of diabetes, with new onset nocturnal polyuria, will check A1C.  Last lab work per PCP, shows elevated renal functions, checking today. Recommend increasing fluids.  Will notify patient with  lab results and discuss next steps.  Recommend follow-up with PCP.  Agrees with plan of care discussed today. Questions answered.  This patient reviewed with Dr. Wyline Mood while patient in the clinic.    No orders of the defined types were placed in this encounter.   Return if symptoms worsen or fail to improve.  Novella Olive, FNP

## 2022-03-24 ENCOUNTER — Ambulatory Visit: Payer: BC Managed Care – PPO | Admitting: Sports Medicine

## 2022-03-24 LAB — BASIC METABOLIC PANEL
BUN/Creatinine Ratio: 16 (ref 9–23)
BUN: 15 mg/dL (ref 6–24)
CO2: 23 mmol/L (ref 20–29)
Calcium: 9.6 mg/dL (ref 8.7–10.2)
Chloride: 104 mmol/L (ref 96–106)
Creatinine, Ser: 0.95 mg/dL (ref 0.57–1.00)
Glucose: 82 mg/dL (ref 70–99)
Potassium: 4.2 mmol/L (ref 3.5–5.2)
Sodium: 140 mmol/L (ref 134–144)
eGFR: 70 mL/min/{1.73_m2} (ref >59)

## 2022-03-24 LAB — TSH+FREE T4
Free T4: 1.01 ng/dL (ref 0.82–1.77)
TSH: 1.32 u[IU]/mL (ref 0.450–4.500)

## 2022-03-24 LAB — HEMOGLOBIN A1C
Est. average glucose Bld gHb Est-mCnc: 117 mg/dL
Hgb A1c MFr Bld: 5.7 % — ABNORMAL HIGH (ref 4.8–5.6)

## 2022-03-24 LAB — BRAIN NATRIURETIC PEPTIDE: BNP: 17.2 pg/mL (ref 0.0–100.0)

## 2022-03-25 LAB — URINE CULTURE

## 2022-03-26 ENCOUNTER — Encounter: Payer: Self-pay | Admitting: Family Medicine

## 2022-04-28 ENCOUNTER — Ambulatory Visit: Payer: BC Managed Care – PPO | Admitting: Sports Medicine

## 2022-04-28 ENCOUNTER — Encounter: Payer: Self-pay | Admitting: Sports Medicine

## 2022-04-28 ENCOUNTER — Ambulatory Visit (INDEPENDENT_AMBULATORY_CARE_PROVIDER_SITE_OTHER): Payer: BC Managed Care – PPO

## 2022-04-28 DIAGNOSIS — M797 Fibromyalgia: Secondary | ICD-10-CM

## 2022-04-28 DIAGNOSIS — M5136 Other intervertebral disc degeneration, lumbar region: Secondary | ICD-10-CM | POA: Diagnosis not present

## 2022-04-28 DIAGNOSIS — M51369 Other intervertebral disc degeneration, lumbar region without mention of lumbar back pain or lower extremity pain: Secondary | ICD-10-CM | POA: Insufficient documentation

## 2022-04-28 MED ORDER — METHYLPREDNISOLONE SODIUM SUCC 125 MG IJ SOLR
125.0000 mg | Freq: Once | INTRAMUSCULAR | Status: AC
  Administered 2022-04-28: 125 mg via INTRAMUSCULAR

## 2022-04-28 MED ORDER — KETOROLAC TROMETHAMINE 60 MG/2ML IM SOLN
60.0000 mg | Freq: Once | INTRAMUSCULAR | Status: AC
  Administered 2022-04-28: 60 mg via INTRAMUSCULAR

## 2022-04-28 MED ORDER — DULOXETINE HCL 30 MG PO CPEP: 30.0000 mg | ORAL_CAPSULE | Freq: Every day | ORAL | 3 refills | Status: DC

## 2022-04-28 MED ORDER — GABAPENTIN 300 MG PO CAPS: ORAL_CAPSULE | ORAL | 3 refills | Status: AC

## 2022-04-28 MED ORDER — PREDNISONE 50 MG PO TABS: ORAL_TABLET | ORAL | 0 refills | Status: DC

## 2022-04-28 NOTE — Assessment & Plan Note (Signed)
Merly also has a long history of axial back pain, she has been seen by Dr. Trula Ore with neurology, she has had several injections, the last of which was in March 2023 and she ended up with what sounds like a dural puncture with 3 epidural blood patches to get control. She is adamant against spinal injections from this point. Her pain is predominately axial with occasional left-sided radicular symptoms, she does endorse progressive weakness subjectively. She tells me her axial pain is worse with sitting, flexion, Valsalva consistent with a discogenic source. On exam she has significant hyperalgesia to light palpation along her paraspinal muscles in the lumbar spine, she has good strength in the lower extremity, she is somewhat hyperreflexic particularly in the patellar and Achilles reflexes, negative Babinski's bilaterally. Due to subjective progressive weakness as well as hyperreflexia I do think this warrants early advanced imaging, will get x-rays today and an MRI hopefully tomorrow. Adding 5 days of prednisone, a gabapentin up taper, higher doses of Cymbalta, formal physical therapy. We will also give her Toradol 60 and Solu-Medrol 125 intramuscular today. Return to see me in about 4 to 6 weeks.

## 2022-04-28 NOTE — Assessment & Plan Note (Signed)
Known fibromyalgia, He does have hyperalgesia and allodynia to light palpation along her back suggesting uncontrolled fibromyalgia. We will bump her Cymbalta up to 90 mg daily. Will also increase her gabapentin to 3 times daily

## 2022-04-28 NOTE — Progress Notes (Signed)
    Procedures performed today:    None.  Independent interpretation of notes and tests performed by another provider:   None.  Brief History, Exam, Impression, and Recommendations:    Fibromyalgia Known fibromyalgia, He does have hyperalgesia and allodynia to light palpation along her back suggesting uncontrolled fibromyalgia. We will bump her Cymbalta up to 90 mg daily. Will also increase her gabapentin to 3 times daily   Lumbar degenerative disc disease Carol Diaz also has a long history of axial back pain, she has been seen by Dr. Trula Ore with neurology, she has had several injections, the last of which was in March 2023 and she ended up with what sounds like a dural puncture with 3 epidural blood patches to get control. She is adamant against spinal injections from this point. Her pain is predominately axial with occasional left-sided radicular symptoms, she does endorse progressive weakness subjectively. She tells me her axial pain is worse with sitting, flexion, Valsalva consistent with a discogenic source. On exam she has significant hyperalgesia to light palpation along her paraspinal muscles in the lumbar spine, she has good strength in the lower extremity, she is somewhat hyperreflexic particularly in the patellar and Achilles reflexes, negative Babinski's bilaterally. Due to subjective progressive weakness as well as hyperreflexia I do think this warrants early advanced imaging, will get x-rays today and an MRI hopefully tomorrow. Adding 5 days of prednisone, a gabapentin up taper, higher doses of Cymbalta, formal physical therapy. We will also give her Toradol 60 and Solu-Medrol 125 intramuscular today. Return to see me in about 4 to 6 weeks.    ____________________________________________ Gwen Her. Dianah Field, M.D., ABFM., CAQSM., AME. Primary Care and Sports Medicine [facility]  Adjunct Professor of [place] [employer]  Risk manager

## 2022-04-29 ENCOUNTER — Ambulatory Visit (INDEPENDENT_AMBULATORY_CARE_PROVIDER_SITE_OTHER): Payer: BC Managed Care – PPO

## 2022-04-29 DIAGNOSIS — M5136 Other intervertebral disc degeneration, lumbar region: Secondary | ICD-10-CM | POA: Diagnosis not present

## 2022-05-01 ENCOUNTER — Encounter: Payer: Self-pay | Admitting: Sports Medicine

## 2022-05-02 ENCOUNTER — Ambulatory Visit: Payer: BC Managed Care – PPO | Attending: Sports Medicine | Admitting: Rehabilitative and Restorative Service Providers"

## 2022-05-02 ENCOUNTER — Other Ambulatory Visit: Payer: Self-pay

## 2022-05-02 ENCOUNTER — Encounter: Payer: Self-pay | Admitting: Rehabilitative and Restorative Service Providers"

## 2022-05-02 DIAGNOSIS — R269 Unspecified abnormalities of gait and mobility: Secondary | ICD-10-CM | POA: Diagnosis not present

## 2022-05-02 DIAGNOSIS — R29898 Other symptoms and signs involving the musculoskeletal system: Secondary | ICD-10-CM | POA: Diagnosis not present

## 2022-05-02 DIAGNOSIS — M5459 Other low back pain: Secondary | ICD-10-CM | POA: Diagnosis not present

## 2022-05-02 DIAGNOSIS — M6281 Muscle weakness (generalized): Secondary | ICD-10-CM | POA: Diagnosis not present

## 2022-05-02 DIAGNOSIS — M5136 Other intervertebral disc degeneration, lumbar region: Secondary | ICD-10-CM | POA: Diagnosis present

## 2022-05-02 DIAGNOSIS — R2689 Other abnormalities of gait and mobility: Secondary | ICD-10-CM

## 2022-05-02 NOTE — Therapy (Addendum)
OUTPATIENT PHYSICAL THERAPY THORACOLUMBAR EVALUATION   Patient Name: Carol Diaz MRN: [id number] DOB:[birth year], 57 y.o., female Today's Date: 05/08/2022  END OF SESSION:   Past Medical History:  Diagnosis Date   Chronic fatigue syndrome    Hypertension    Migraines    Palpitations    Renal insufficiency    Sjogren's disease ([place]) 03/17/2020   Past Surgical History:  Procedure Laterality Date   CESAREAN SECTION     ivp     LAPAROSCOPIC APPENDECTOMY N/A 05/17/2017   Procedure: DIAGNOSTIC LAPAROSCOPY APPENDECTOMY;  Surgeon: Leighton Ruff, MD;  Location: [facility];  Service: General;  Laterality: N/A;   TUBAL LIGATION     Patient Active Problem List   Diagnosis Date Noted   Lumbar degenerative disc disease 04/28/2022   Acute midline low back pain 03/23/2022   Fluid retention in legs 03/23/2022   Exertional dyspnea 03/23/2022   Nocturnal polyuria 03/23/2022   Dehydration, mild 03/23/2022   Urticaria due to heat 01/03/2022   Low serum cortisol level 05/02/2021   Hashimoto's disease 05/02/2021   Elevated antithyroglobulin IgG 04/08/2021   GERD (gastroesophageal reflux disease) 11/25/2020   Renal insufficiency    Hypertension    Fatigue    Sjogren's disease ([place]) 03/17/2020   Thiamin deficiency 09/30/2019   Localized swelling of both lower extremities 08/01/2019   Blue toes 08/01/2019   Tachycardia 06/13/2019   Abnormal ankle brachial index (ABI) 10/07/2018   Fibromyalgia 07/17/2016   CKD (chronic kidney disease) stage 2, GFR 60-89 ml/min 09/27/2015   Neck pain 06/10/2015   Obesity 09/25/2013   Essential tremor 02/02/2012   Hyperlipidemia 12/15/2011   GROSS HEMATURIA 04/06/2010   Iron deficiency anemia 09/22/2009   Palpitations with regular cardiac rhythm 09/22/2009   FATIGUE 08/31/2009   HYPOGLYCEMIA, UNSPECIFIED 11/20/2008   MENORRHAGIA 09/05/2007   DEPRESSION, MAJOR, RECURRENT 01/02/2006   Migraine headache 01/02/2006   IRRITABLE BOWEL SYNDROME 01/02/2006    INSOMNIA NOS 01/02/2006    PCP: Dr Beatrice Lecher  REFERRING PROVIDER: Dr Aundria Mems  REFERRING DIAG: Lumbar DDD  Rationale for Evaluation and Treatment: Rehabilitation  THERAPY DIAG:  Lumbar degenerative disc disease - Plan: PT plan of care cert/re-cert  Other low back pain - Plan: PT plan of care cert/re-cert  Other symptoms and signs involving the musculoskeletal system - Plan: PT plan of care cert/re-cert  Other abnormalities of gait and mobility - Plan: PT plan of care cert/re-cert  Muscle weakness (generalized) - Plan: PT plan of care cert/re-cert  ONSET DATE: [id number]  SUBJECTIVE:  SUBJECTIVE STATEMENT: Patient reports that she started having LBP and pain into the Lt LE for the past 3 weeks with no known injury. She has not been able to work in the past four days.   PERTINENT HISTORY:  Long standing LBP for ~ 10 years; history of multiple injections; last injection 3/23 with possible dural puncture requiring 3 epidural blood patches; occasional Lt sided radicular pain; subjective weakness; fibromyalgia; hyperalgesia; allodynia to light touch; chronic kidney disease; hashimoto's disease; Shoring's disease;     PAIN:  Are you having pain? Yes: NPRS scale: 6/10 Pain location: Lt leg posteriorly into back of knee area  Pain description: dull Aggravating factors: sitting; bending; lifting; prolonged standing > 30 min  Relieving factors: lying down with LE's propped with back angled with pillows; heat (has used TENS with no help)  PRECAUTIONS: None  WEIGHT BEARING RESTRICTIONS: No  FALLS:  Has patient fallen in last 6 months? 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 Conus medullaris and cauda equina: Conus extends to the L2 level. Conus and cauda equina appear normal. Paraspinal and other soft tissues: Negative for perispinal mass or inflammation.  Disc levels: L4-5: Disc space narrowing that is mild. Circumferential disc bulging. No neural compression.  L5-S1: Mild disc bulging. Degenerative facet spurring asymmetric to the right.  IMPRESSION: Overall mild degeneration as described.  No neural impingement.  PATIENT SURVEYS:  FOTO 26; goal 46  SCREENING FOR RED FLAGS: Bowel or bladder incontinence: No Spinal tumors: No Cauda equina syndrome: No Compression fracture: No Abdominal aneurysm: No  COGNITION: Overall cognitive status: Within functional limits for tasks assessed     SENSATION: WFL  MUSCLE LENGTH: Hamstrings: Right 70 deg; Left 30 deg  POSTURE: rounded shoulders, forward head, decreased lumbar lordosis, increased thoracic kyphosis, flexed trunk , and weight shift left  PALPATION: Muscular tightness Lt > Rt lumbar paraspinals; QL; lats; piriformis; gluts to HS   LUMBAR ROM:   AROM eval  Flexion 10% pain   Extension Neutral pain  Right lateral flexion 15%  Left lateral flexion 25% pain  Right rotation 5%  Left rotation 5%   (Blank rows = not tested)  LOWER EXTREMITY ROM:     Active  Right eval Left eval  Hip flexion [facility]  Hip extension neutral neutral  Hip abduction    Hip adduction    Hip internal rotation    Hip external rotation Tight  Tight   Knee flexion [facility] WFL  Knee extension [facility]  Ankle dorsiflexion    Ankle plantarflexion    Ankle inversion    Ankle eversion     (Blank rows = not  tested)  LOWER EXTREMITY MMT:  strength not assessed resistively; pt moves LE's against gravity, experiences increased pain with attempt to test strength   MMT Right eval Left eval  Hip flexion    Hip extension    Hip abduction    Hip adduction    Hip internal rotation    Hip external rotation    Knee flexion    Knee  extension    Ankle dorsiflexion    Ankle plantarflexion    Ankle inversion    Ankle eversion     (Blank rows = not tested)  LUMBAR SPECIAL TESTS:  Straight leg raise test: (+) Lt 30 deg; (-) Rt Slump test: increased pain Lt LE  BALANCE:   SLS Rt 3 sec; Lt 1 sec   GAIT: Distance walked: 40 Assistive device utilized: None Level of assistance: Complete Independence Comments: antalgic gait; decreased stance phase Lt LE; trunk forward flexed and shifted to the Rt; Lt LE in ER compared to Rt   TODAY'S TREATMENT:                                                                                                                              [facility] Adult PT Treatment:                                                DATE: 05/02/22: Therapeutic Exercise: Prone glut set 5 sec x 5  Prone - Knee flexion x 3 each LE to pt tolerance  Supine diaphragmatic breathing  Gentle nerve mobilization  Manual Therapy: Gentle soft tissue work through the lumbar spine into the lt posterior hip w/ pt prone  Neuromuscular re-ed:  Therapeutic Activity: Modifications for sleeping ans rest positions  Gait:  Modalities:  Self Care: Back care education re- transitional movements sit to stand and sit <>supine; rolling     PATIENT EDUCATION:  Education details: POC; HEP  Person educated: Patient Education method: Explanation, Demonstration, Tactile cues, Verbal cues, and Handouts Education comprehension: verbalized understanding, returned demonstration, verbal cues required, tactile cues required, and needs further education  HOME EXERCISE PROGRAM: Access Code: [email or web address] URL:  [email or web address] Date: 05/02/2022 Prepared by: Gillermo Murdoch  Exercises - Prone Gluteal Sets  - 2 x daily - 7 x weekly - 1 sets - 10 reps - 10 sec  hold - Prone Knee Flexion AROM  - 2 x daily - 7 x weekly - 1 sets - 5-10 reps - 1-2 sec  hold - Supine Diaphragmatic Breathing  - 2 x daily - 7 x weekly - 1 sets - 10 reps - 4-6 sec  hold - Supine Sciatic Nerve Glide  - 2 x daily - 7 x weekly - 1 sets - 8-10 reps - 1-2 sec  hold  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 female who was seen today for physical therapy evaluation and treatment for Lumbar DDD. She has a long standing history of LBP with occasional Lt LE radicular symptoms. She has significant limitations in lumbar and LE mobility,ROM, strength; neural tension Lt LE; poor posture and alignment; limited functional activity tolerance; antalgic gait; poor postures and  positions for rest and work.   OBJECTIVE IMPAIRMENTS: Abnormal gait, decreased activity tolerance, decreased balance, decreased endurance, decreased mobility, decreased ROM, decreased strength, hypomobility, increased fascial restrictions, increased muscle spasms, impaired flexibility, improper body mechanics, postural dysfunction, and pain.   ACTIVITY LIMITATIONS: carrying, lifting, bending, sitting, squatting, sleeping, stairs, transfers, bed mobility, and reach over head  PARTICIPATION LIMITATIONS: meal prep, cleaning, laundry, shopping, and occupation  PERSONAL FACTORS: Behavior pattern, Fitness, Past/current experiences, Profession, Time since onset of injury/illness/exacerbation, and comorbidities including chronic medical conditions are also affecting patient's functional outcome.   REHAB POTENTIAL: Good  CLINICAL DECISION MAKING: Stable/uncomplicated  EVALUATION COMPLEXITY: Low   GOALS: Goals reviewed with patient? Yes  SHORT TERM  GOALS: Target date: 05/30/2022  Independent initial  HEP Baseline: Goal status: INITIAL  2.  Patient demonstrates and verbalizes proper transfers and transitions movement to protect back  Baseline:  Goal status: INITIAL   LONG TERM GOALS: Target date: 06/27/2022  Improve spinal and LE mobility to WFL's  Baseline:  Goal status: INITIAL  2.  Decrease pain by 25-50% allowing patient to return to more normal functional activities  Baseline:  Goal status: INITIAL  3.  Improve core strength and stability allowing patient to exercise for 20 min without increased symptoms  Baseline:  Goal status: INITIAL  4.  Improve nerve mobility Lt LE  Baseline:  Goal status: INITIAL  5.  Independent in HEP including aquatic therapy as indicated  Baseline:  Goal status: INITIAL  6.  Improve functional limitation score to 48 Baseline:  Goal status: INITIAL  PLAN:  PT FREQUENCY: 2x/week  PT DURATION: 8 weeks  PLANNED INTERVENTIONS: Therapeutic exercises, Therapeutic activity, Neuromuscular re-education, Balance training, Gait training, Patient/Family education, Self Care, Joint mobilization, Stair training, Aquatic Therapy, Dry Needling, Electrical stimulation, Spinal mobilization, Cryotherapy, Moist heat, Taping, Traction, Ultrasound, Ionotophoresis 56m/ml Dexamethasone, Manual therapy, and Re-evaluation.  PLAN FOR NEXT SESSION: review and progress exercise; continue with back care education; manual work, DN, modalities as indicated   [employer] PT 05/08/2022, 5:08 PM

## 2022-05-05 ENCOUNTER — Encounter: Payer: Self-pay | Admitting: Rehabilitative and Restorative Service Providers"

## 2022-05-05 ENCOUNTER — Ambulatory Visit: Payer: BC Managed Care – PPO | Admitting: Rehabilitative and Restorative Service Providers"

## 2022-05-05 DIAGNOSIS — M5136 Other intervertebral disc degeneration, lumbar region: Secondary | ICD-10-CM | POA: Diagnosis not present

## 2022-05-05 DIAGNOSIS — R2689 Other abnormalities of gait and mobility: Secondary | ICD-10-CM

## 2022-05-05 DIAGNOSIS — M6281 Muscle weakness (generalized): Secondary | ICD-10-CM

## 2022-05-05 DIAGNOSIS — R29898 Other symptoms and signs involving the musculoskeletal system: Secondary | ICD-10-CM

## 2022-05-05 DIAGNOSIS — M5459 Other low back pain: Secondary | ICD-10-CM

## 2022-05-05 NOTE — Therapy (Signed)
OUTPATIENT PHYSICAL THERAPY THORACOLUMBAR TREATMENT  Patient Name: Carol Diaz MRN: [id number] DOB:[birth year], 57 y.o., female Today's Date: 05/05/2022  END OF SESSION:  PT End of Session - 05/05/22 1019     Visit Number 2    Number of Visits 16    Date for PT Re-Evaluation 06/27/22    Authorization Type [employer] state plan    PT Start Time 1020    PT Stop Time 1106    PT Time Calculation (min) 46 min    Activity Tolerance Patient limited by pain;Patient tolerated treatment well    Behavior During Therapy [facility] for tasks assessed/performed             Past Medical History:  Diagnosis Date   Chronic fatigue syndrome    Hypertension    Migraines    Palpitations    Renal insufficiency    Sjogren's disease ([place]) 03/17/2020   Past Surgical History:  Procedure Laterality Date   CESAREAN SECTION     ivp     LAPAROSCOPIC APPENDECTOMY N/A 05/17/2017   Procedure: DIAGNOSTIC LAPAROSCOPY APPENDECTOMY;  Surgeon: Leighton Ruff, MD;  Location: [facility];  Service: General;  Laterality: N/A;   TUBAL LIGATION     Patient Active Problem List   Diagnosis Date Noted   Lumbar degenerative disc disease 04/28/2022   Acute midline low back pain 03/23/2022   Fluid retention in legs 03/23/2022   Exertional dyspnea 03/23/2022   Nocturnal polyuria 03/23/2022   Dehydration, mild 03/23/2022   Urticaria due to heat 01/03/2022   Low serum cortisol level 05/02/2021   Hashimoto's disease 05/02/2021   Elevated antithyroglobulin IgG 04/08/2021   GERD (gastroesophageal reflux disease) 11/25/2020   Renal insufficiency    Hypertension    Fatigue    Sjogren's disease ([place]) 03/17/2020   Thiamin deficiency 09/30/2019   Localized swelling of both lower extremities 08/01/2019   Blue toes 08/01/2019   Tachycardia 06/13/2019   Abnormal ankle brachial index (ABI) 10/07/2018   Fibromyalgia 07/17/2016   CKD (chronic kidney disease) stage 2, GFR 60-89 ml/min 09/27/2015   Neck pain 06/10/2015   Obesity  09/25/2013   Essential tremor 02/02/2012   Hyperlipidemia 12/15/2011   GROSS HEMATURIA 04/06/2010   Iron deficiency anemia 09/22/2009   Palpitations with regular cardiac rhythm 09/22/2009   FATIGUE 08/31/2009   HYPOGLYCEMIA, UNSPECIFIED 11/20/2008   MENORRHAGIA 09/05/2007   DEPRESSION, MAJOR, RECURRENT 01/02/2006   Migraine headache 01/02/2006   IRRITABLE BOWEL SYNDROME 01/02/2006   INSOMNIA NOS 01/02/2006    PCP: Dr Beatrice Lecher REFERRING PROVIDER: Dr Aundria Mems REFERRING DIAG: Lumbar DDD Rationale for Evaluation and Treatment: Rehabilitation THERAPY DIAG:  Other low back pain  Other symptoms and signs involving the musculoskeletal system  Other abnormalities of gait and mobility  Muscle weakness (generalized)  ONSET DATE: 04/10/22  SUBJECTIVE:  SUBJECTIVE STATEMENT: The patient reports symptoms 5/10 in the L low back and gluts radiating to knee. She did HEP and found them a little difficult, but able to do.   EVAL:Patient reports that she started having LBP and pain into the Lt LE for the past 3 weeks with no known injury. She has not been able to work in the past four days.   PERTINENT HISTORY:  Long standing LBP for ~ 10 years; history of multiple injections; last injection 3/23 with possible dural puncture requiring 3 epidural blood patches; occasional Lt sided radicular pain; subjective weakness; fibromyalgia; hyperalgesia; allodynia to light touch; chronic kidney disease; hashimoto's disease; Shoring's disease;     PAIN:  Are you having pain? Yes: NPRS scale: 5/10 Pain location: Lt leg posteriorly into back of knee area  Pain description: dull Aggravating factors: sitting; bending; lifting; prolonged standing > 30 min  Relieving factors: lying down with LE's propped  with back angled with pillows; heat (has used TENS with no help)  PRECAUTIONS: None  WEIGHT BEARING RESTRICTIONS: No  FALLS:  Has patient fallen in last 6 months? 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 Conus medullaris and cauda equina: Conus extends to the L2 level. Conus and cauda equina appear normal. Paraspinal and other soft tissues: Negative for perispinal mass or inflammation.  Disc levels: L4-5: Disc space narrowing that is mild. Circumferential disc bulging. No neural compression.  L5-S1: Mild disc bulging. Degenerative facet spurring asymmetric to the right.  IMPRESSION: Overall mild degeneration as described.  No neural impingement.  MUSCLE LENGTH: Hamstrings: Right 70 deg; Left 30 deg  POSTURE: rounded shoulders, forward head, decreased lumbar lordosis, increased thoracic kyphosis, flexed trunk , and weight shift left  PALPATION: Muscular tightness Lt > Rt lumbar paraspinals; QL; lats; piriformis; gluts to HS   LUMBAR ROM:   AROM eval  Flexion 10% pain   Extension Neutral pain  Right lateral flexion 15%  Left lateral flexion 25% pain  Right rotation 5%  Left rotation 5%   (Blank rows = not tested)  LOWER EXTREMITY ROM:     Active  Right eval Left eval  Hip flexion [facility]  Hip extension neutral neutral  Hip abduction    Hip adduction    Hip internal rotation    Hip external rotation Tight  Tight   Knee flexion [facility] WFL  Knee extension [facility]  Ankle dorsiflexion    Ankle plantarflexion    Ankle inversion    Ankle eversion     (Blank rows = not tested)  LOWER EXTREMITY MMT:  strength not assessed resistively; pt moves  LE's against gravity, experiences increased pain with attempt to test strength   LUMBAR SPECIAL TESTS:  Straight leg raise test: (+) Lt 30 deg; (-) Rt Slump test: increased pain Lt LE  BALANCE:   SLS Rt 3 sec; Lt 1 sec   [facility] Adult PT Treatment:  DATE: 05/05/22  Therapeutic Exercise: Prone Glut sets reviewed for HEP On elbows-- increases pain so did not continue this position PROM hip IR/ER Hamstring isometric with PT resistance at submaximal level to engage musculature L side Supine Hip adductor isometrics [place]-- not able to tolerate due to pain PROM HS stretch Nerve glides with ankle DF/PF at submaximal L HS stretch Gentle lumbar rocking Manual Therapy: STM Trigger Point Dry-Needling  Treatment instructions: Expect mild to moderate muscle soreness. Patient verbalized understanding of these instructions and education. Patient Consent Given: Yes Education handout provided: Previously provided Muscles treated: L glut med, L lumbar multifidi, L glut max / piriformis Treatment response/outcome: palpable lengthening Modalities: Heat and interferential e-stim x 10 minutes L QL and glut region to reduce pain                                                                                                                          [facility] Adult PT Treatment:                                                DATE: 05/02/22: Therapeutic Exercise: Prone glut set 5 sec x 5  Prone - Knee flexion x 3 each LE to pt tolerance  Supine diaphragmatic breathing  Gentle nerve mobilization  Manual Therapy: Gentle soft tissue work through the lumbar spine into the lt posterior hip w/ pt prone  Therapeutic Activity: Modifications for sleeping ans rest positions  Self Care: Back care education re- transitional movements sit to stand and sit <>supine; rolling   PATIENT EDUCATION:  Education details: POC; HEP  Person educated: Patient Education  method: Explanation, Demonstration, Tactile cues, Verbal cues, and Handouts Education comprehension: verbalized understanding, returned demonstration, verbal cues required, tactile cues required, and needs further education  HOME EXERCISE PROGRAM: Access Code: [email or web address] URL: [email or web address] Date: 05/05/2022 Prepared by: Rudell Cobb  Exercises - Prone Gluteal Sets  - 1 x daily - 7 x weekly - 1 sets - 10 reps - 10 sec  hold - Prone Knee Flexion AROM  - 1 x daily - 7 x weekly - 1 sets - 5-10 reps - 1-2 sec  hold - Supine Diaphragmatic Breathing  - 1 x daily - 7 x weekly - 1 sets - 10 reps - 4-6 sec  hold - Supine March  - 1 x daily - 7 x weekly - 1 sets - 5 reps - Hooklying Single Knee to Chest Stretch  - 1 x daily - 7 x weekly - 1 sets - 3 reps - 20-30 seconds hold - Supine Sciatic Nerve Glide  - 1 x daily - 7 x weekly - 1 sets - 8-10 reps - 1-2 sec  hold  ASSESSMENT:  CLINICAL IMPRESSION: Patient limited by pain during today's session. PT performed gentle STM and dry needling in L glut med,  glut max and lumbar multifidi. Patient tolerated treatment well. Also added core activation with supine marching and knee to chest, as this was tolerated well in therapy. Plan to continue working to LTG and will modify to patient tolerance.  EVAL: Patient is a 57 y.o. female who was seen today for physical therapy evaluation and treatment for Lumbar DDD. She has a long standing history of LBP with occasional Lt LE radicular symptoms. She has significant limitations in lumbar and LE mobility,ROM, strength; neural tension Lt LE; poor posture and alignment; limited functional activity tolerance; antalgic gait; poor postures and positions for rest and work.   OBJECTIVE IMPAIRMENTS: Abnormal gait, decreased activity tolerance, decreased balance, decreased endurance, decreased mobility, decreased ROM, decreased strength, hypomobility, increased fascial restrictions, increased muscle  spasms, impaired flexibility, improper body mechanics, postural dysfunction, and pain.   GOALS: Goals reviewed with patient? Yes  SHORT TERM GOALS: Target date: 05/30/2022  Independent initial  HEP Baseline: Goal status: INITIAL  2.  Patient demonstrates and verbalizes proper transfers and transitions movement to protect back  Baseline:  Goal status: INITIAL   LONG TERM GOALS: Target date: 06/27/2022  Improve spinal and LE mobility to WFL's  Baseline:  Goal status: INITIAL  2.  Decrease pain by 25-50% allowing patient to return to more normal functional activities  Baseline:  Goal status: INITIAL  3.  Improve core strength and stability allowing patient to exercise for 20 min without increased symptoms  Baseline:  Goal status: INITIAL  4.  Improve nerve mobility Lt LE  Baseline:  Goal status: INITIAL  5.  Independent in HEP including aquatic therapy as indicated  Baseline:  Goal status: INITIAL  6.  Improve functional limitation score to 48 Baseline:  Goal status: INITIAL  PLAN:  PT FREQUENCY: 2x/week  PT DURATION: 8 weeks  PLANNED INTERVENTIONS: Therapeutic exercises, Therapeutic activity, Neuromuscular re-education, Balance training, Gait training, Patient/Family education, Self Care, Joint mobilization, Stair training, Aquatic Therapy, Dry Needling, Electrical stimulation, Spinal mobilization, Cryotherapy, Moist heat, Taping, Traction, Ultrasound, Ionotophoresis 53m/ml Dexamethasone, Manual therapy, and Re-evaluation.  PLAN FOR NEXT SESSION: How did patient tolerate DN, STM?  Review and progress exercise; continue with back care education; manual work, DN, modalities as indicated   WM[place]way PFredonia2/11/2022, 12:50 PM

## 2022-05-08 NOTE — Addendum Note (Signed)
Addended by: Everardo All on: 05/08/2022 05:10 PM   Modules accepted: Orders

## 2022-05-09 ENCOUNTER — Ambulatory Visit: Payer: BC Managed Care – PPO | Admitting: Family Medicine

## 2022-05-09 ENCOUNTER — Encounter: Payer: Self-pay | Admitting: Family Medicine

## 2022-05-09 ENCOUNTER — Ambulatory Visit: Payer: BC Managed Care – PPO | Admitting: Sports Medicine

## 2022-05-09 VITALS — BP 111/60 | HR 76 | Ht 66.0 in | Wt 156.1 lb

## 2022-05-09 DIAGNOSIS — R42 Dizziness and giddiness: Secondary | ICD-10-CM

## 2022-05-09 DIAGNOSIS — Z87898 Personal history of other specified conditions: Secondary | ICD-10-CM

## 2022-05-09 DIAGNOSIS — R519 Headache, unspecified: Secondary | ICD-10-CM

## 2022-05-09 DIAGNOSIS — R413 Other amnesia: Secondary | ICD-10-CM | POA: Diagnosis not present

## 2022-05-09 DIAGNOSIS — G43009 Migraine without aura, not intractable, without status migrainosus: Secondary | ICD-10-CM

## 2022-05-09 DIAGNOSIS — I10 Essential (primary) hypertension: Secondary | ICD-10-CM | POA: Diagnosis not present

## 2022-05-09 DIAGNOSIS — R202 Paresthesia of skin: Secondary | ICD-10-CM

## 2022-05-09 DIAGNOSIS — R251 Tremor, unspecified: Secondary | ICD-10-CM

## 2022-05-09 DIAGNOSIS — G250 Essential tremor: Secondary | ICD-10-CM

## 2022-05-09 DIAGNOSIS — R200 Anesthesia of skin: Secondary | ICD-10-CM | POA: Insufficient documentation

## 2022-05-09 NOTE — Assessment & Plan Note (Signed)
Also reports numbness and tingling in both feet.  We did not do a monofilament exam today but will check for be 1, 6 and 12 deficiencies.  Will check for anemia.  She just had a basic metabolic panel done about 7 to 8 weeks ago.

## 2022-05-09 NOTE — Assessment & Plan Note (Signed)
Carol Diaz she is really struggling with her short-term memory also again over the last 3 months.  We did do a Moca today and she made a score of 30 out of 30 which is fantastic.

## 2022-05-09 NOTE — Assessment & Plan Note (Signed)
Has a history of migraines but has been having more daily headaches that is of concern they never completely go away she wakes up with them and goes to bed with they have been much more persistent over the last 3 months.  Her last MRI showed low fluid levels again after having had a lumbar puncture in 2 blood patches.  She would like to repeat her scan and see if there have been any changes or improvement in fluid present on the scan.

## 2022-05-09 NOTE — Assessment & Plan Note (Signed)
Pressure looks phenomenal today.

## 2022-05-09 NOTE — Progress Notes (Signed)
Established Patient Office Visit  Subjective   Patient ID: Carol Diaz, female    DOB: 1965/11/16  Age: 57 y.o. MRN: [id number]  Chief Complaint  Patient presents with   Leg Pain   Back Pain   Dizziness   HPI 57 year old female who presents with CC of leg pain, back pain, dizziness, tremor, and headache. Pt states the dizziness and headaches have been going on for the past year. She states about a year ago she had an epidural in which she had a CSF leak and had to have 2 blood patches done. Since having her epidural, she states she has had a fluid draining from her right nostril that is not consistent with rhinorrhea. She states her neurologist wanted her to capture some but she has not been able to. Within the last 3 months her symptoms have increased in severity and frequency to where she is experiencing them daily. Her husband is present in the room and states that things have drastically changed in the last couple months with worsening headaches, memory loss, resting tremor, and dizziness/loss of balance. She is currently being seen for her back and leg pain by orthopedics for which she states she has been diagnosed with herniated disc at L4-L5 that is causing her to have sciatica. She states that her quality of life has significantly decreased and she having issues with working. Her and her husband have had discussions as if she should retire. She endorses dizziness, headaches, tinnitus, paresthesias, worsening tremor. Denies vision changes, nausea, vomiting, loss of consciousness.     Review of Systems  Constitutional:  Negative for chills, fever and malaise/fatigue.  HENT:  Positive for tinnitus. Negative for congestion, ear pain and nosebleeds.   Eyes:  Negative for blurred vision and double vision.  Respiratory:  Negative for cough and shortness of breath.   Cardiovascular:  Negative for chest pain and palpitations.  Gastrointestinal:  Positive for constipation. Negative for  nausea and vomiting.  Genitourinary:  Negative for dysuria, flank pain, frequency and urgency.  Musculoskeletal:  Positive for back pain and myalgias.  Skin:  Negative for rash.  Neurological:  Positive for dizziness, tremors and headaches. Negative for speech change, focal weakness, seizures and loss of consciousness.  Psychiatric/Behavioral: Negative.        Objective:     BP 111/60 (BP Location: Left Arm, Patient Position: Sitting, Cuff Size: Small)   Pulse 76   Ht 5' 6"$  (1.676 m)   Wt 70.8 kg   LMP 04/15/2018   SpO2 97%   BMI 25.19 kg/m    Physical Exam Constitutional:      General: She is not in acute distress. HENT:     Head: Normocephalic and atraumatic.     Right Ear: Tympanic membrane and ear canal normal.     Left Ear: Tympanic membrane and ear canal normal.     Nose: No congestion.  Eyes:     Extraocular Movements: Extraocular movements intact.  Cardiovascular:     Rate and Rhythm: Normal rate and regular rhythm.     Pulses: Normal pulses.     Heart sounds: Normal heart sounds. No murmur heard. Pulmonary:     Effort: Pulmonary effort is normal.  Abdominal:     General: Abdomen is flat. Bowel sounds are normal.     Tenderness: There is no abdominal tenderness. There is no right CVA tenderness or left CVA tenderness.  Neurological:     Mental Status: She is alert and  oriented to person, place, and time.     Motor: Weakness present.     Comments: Mild cogwheel rigidity of left upper extremity. Bilateral patella reflexes 3+. Bicep s Reflexes 2+. Mild dysdiadochocinesia of upper extremities, difficult to tell with tremor  Psychiatric:        Mood and Affect: Mood normal.        Thought Content: Thought content normal.      No results found for any visits on 05/09/22.    The 10-year ASCVD risk score (Arnett DK, et al., 2019) is: 1.8%    Assessment & Plan:   Problem List Items Addressed This Visit   None Visit Diagnoses     Daily headache    -   Primary   Relevant Orders   MR Brain W Wo Contrast   Ambulatory referral to Neurology   CBC   B12   Vitamin B1   Vitamin B6   Magnesium   COMPLETE METABOLIC PANEL WITH GFR   Tremor       Relevant Orders   Ambulatory referral to Neurology   CBC   B12   Vitamin B1   Vitamin B6   Magnesium   COMPLETE METABOLIC PANEL WITH GFR   Dizziness       Relevant Orders   CBC   B12   Vitamin B1   Vitamin B6   Magnesium   COMPLETE METABOLIC PANEL WITH GFR       No follow-ups on file.    Augustin Coupe, Student-PA

## 2022-05-09 NOTE — Assessment & Plan Note (Signed)
She has been diagnosed with an essential tremor for over a decade at this point.  But she does feel like her tremor is progressing it is now affecting her head.  She is not currently on any medications to help control the tremor.  She also has some cogwheeling noticed in the left arm.  None in the right.  And no rest tremor in the hands.

## 2022-05-09 NOTE — Progress Notes (Signed)
Established Patient Office Visit  Subjective   Patient ID: Carol Diaz, female    DOB: [birth year]  Age: 57 y.o. MRN: [id number]  Chief Complaint  Patient presents with   Leg Pain   Back Pain   Dizziness    HPI   See HPI that was reviewed and discussed with the patient during the office visit. Really feels like a lot of the symptoms started after she had a lumbar puncture in the early part of 2023.  But in the last 3 months she feels like her decline has been more rapid and significant.  Her pain and poor quality of life has caused her to miss a significant amount of work.  Even yesterday she tried to force herself to go and was only able to work for about 2 hours before finally having to leave.    ROS    Objective:     BP 111/60 (BP Location: Left Arm, Patient Position: Sitting, Cuff Size: Small)   Pulse 76   Ht 5' 6"$  (1.676 m)   Wt 156 lb 1.3 oz (70.8 kg)   LMP 04/15/2018   SpO2 97%   BMI 25.19 kg/m    Physical Exam Vitals and nursing note reviewed.  Constitutional:      Appearance: She is well-developed.  HENT:     Head: Normocephalic and atraumatic.  Cardiovascular:     Rate and Rhythm: Normal rate and regular rhythm.     Heart sounds: Normal heart sounds.  Pulmonary:     Effort: Pulmonary effort is normal.     Breath sounds: Normal breath sounds.  Musculoskeletal:     Comments: Strength of the elbows and shoulders is 5 out of 5.  Prescription strength at the hips knees and ankles is 5 out of 5.  She does have tremor with intention in both hands.  And a head tremor.  Some slight cogwheeling in the left arm but none on the right.  Skin:    General: Skin is warm and dry.  Neurological:     Mental Status: She is alert and oriented to person, place, and time.  Psychiatric:        Behavior: Behavior normal.      No results found for any visits on 05/09/22.    The 10-year ASCVD risk score (Arnett DK, et al., 2019) is: 1.8%    Assessment & Plan:    Problem List Items Addressed This Visit       Cardiovascular and Mediastinum   Migraine headache    Has a history of migraines but has been having more daily headaches that is of concern they never completely go away she wakes up with them and goes to bed with they have been much more persistent over the last 3 months.  Her last MRI showed low fluid levels again after having had a lumbar puncture in 2 blood patches.  She would like to repeat her scan and see if there have been any changes or improvement in fluid present on the scan.      Hypertension    Pressure looks phenomenal today.        Nervous and Auditory   Essential tremor    She has been diagnosed with an essential tremor for over a decade at this point.  But she does feel like her tremor is progressing it is now affecting her head.  She is not currently on any medications to help control the tremor.  She also has  some cogwheeling noticed in the left arm.  None in the right.  And no rest tremor in the hands.        Other   Numbness and tingling of both feet    Also reports numbness and tingling in both feet.  We did not do a monofilament exam today but will check for be 1, 6 and 12 deficiencies.  Will check for anemia.  She just had a basic metabolic panel done about 7 to 8 weeks ago.      Memory changes    Will she is really struggling with her short-term memory also again over the last 3 months.  We did do a Moca today and she made a score of 30 out of 30 which is fantastic.      Other Visit Diagnoses     Daily headache    -  Primary   Relevant Orders   MR Brain W Wo Contrast   Ambulatory referral to Neurology   CBC   B12   Vitamin B1   Vitamin B6   Magnesium   COMPLETE METABOLIC PANEL WITH GFR   Tremor       Relevant Orders   Ambulatory referral to Neurology   CBC   B12   Vitamin B1   Vitamin B6   Magnesium   COMPLETE METABOLIC PANEL WITH GFR   Dizziness       Relevant Orders   CBC   B12    Vitamin B1   Vitamin B6   Magnesium   COMPLETE METABOLIC PANEL WITH GFR   History of unsteady gait          Daily headache-see note under migraine.  Return in about 4 weeks (around 06/06/2022).    Beatrice Lecher, MD

## 2022-05-10 ENCOUNTER — Encounter: Payer: Self-pay | Admitting: Rehabilitative and Restorative Service Providers"

## 2022-05-10 ENCOUNTER — Ambulatory Visit: Payer: BC Managed Care – PPO | Admitting: Rehabilitative and Restorative Service Providers"

## 2022-05-10 DIAGNOSIS — R2689 Other abnormalities of gait and mobility: Secondary | ICD-10-CM

## 2022-05-10 DIAGNOSIS — M6281 Muscle weakness (generalized): Secondary | ICD-10-CM

## 2022-05-10 DIAGNOSIS — M5136 Other intervertebral disc degeneration, lumbar region: Secondary | ICD-10-CM | POA: Diagnosis not present

## 2022-05-10 DIAGNOSIS — R29898 Other symptoms and signs involving the musculoskeletal system: Secondary | ICD-10-CM

## 2022-05-10 DIAGNOSIS — M5459 Other low back pain: Secondary | ICD-10-CM

## 2022-05-10 NOTE — Progress Notes (Signed)
Hi Anise, we are working on getting the MRI scheduled.  Did recheck your blood counts at that had been about 4 months since we checked it and there is no anemia or blood loss or sign of systemic infection which is good.  B12 looks good as well magnesium is also normal.  Metabolic panel looks good as well.  Couple of other labs are still pending and will likely take a couple days to come back.

## 2022-05-10 NOTE — Therapy (Signed)
OUTPATIENT PHYSICAL THERAPY THORACOLUMBAR TREATMENT  Patient Name: Carol Diaz MRN: [id number] DOB:26-Sep-1965, 57 y.o., female Today's Date: 05/10/2022  END OF SESSION:  PT End of Session - 05/10/22 1449     Visit Number 3    Number of Visits 16    Date for PT Re-Evaluation 06/27/22    Authorization Type [employer] state plan    PT Start Time [id number]    PT Stop Time 1533    PT Time Calculation (min) 46 min    Activity Tolerance Patient limited by pain;Patient tolerated treatment well             Past Medical History:  Diagnosis Date   Chronic fatigue syndrome    Hypertension    Migraines    Palpitations    Renal insufficiency    Sjogren's disease ([place]) 03/17/2020   Past Surgical History:  Procedure Laterality Date   CESAREAN SECTION     ivp     LAPAROSCOPIC APPENDECTOMY N/A 05/17/2017   Procedure: DIAGNOSTIC LAPAROSCOPY APPENDECTOMY;  Surgeon: Leighton Ruff, MD;  Location: [facility];  Service: General;  Laterality: N/A;   TUBAL LIGATION     Patient Active Problem List   Diagnosis Date Noted   Numbness and tingling of both feet 05/09/2022   Memory changes 05/09/2022   Lumbar degenerative disc disease 04/28/2022   Acute midline low back pain 03/23/2022   Fluid retention in legs 03/23/2022   Exertional dyspnea 03/23/2022   Nocturnal polyuria 03/23/2022   Urticaria due to heat 01/03/2022   Low serum cortisol level 05/02/2021   Hashimoto's disease 05/02/2021   Elevated antithyroglobulin IgG 04/08/2021   GERD (gastroesophageal reflux disease) 11/25/2020   Renal insufficiency    Hypertension    Fatigue    Sjogren's disease ([place]) 03/17/2020   Thiamin deficiency 09/30/2019   Localized swelling of both lower extremities 08/01/2019   Blue toes 08/01/2019   Tachycardia 06/13/2019   Abnormal ankle brachial index (ABI) 10/07/2018   Fibromyalgia 07/17/2016   CKD (chronic kidney disease) stage 2, GFR 60-89 ml/min 09/27/2015   Neck pain 06/10/2015   Obesity 09/25/2013    Essential tremor 02/02/2012   Hyperlipidemia 12/15/2011   GROSS HEMATURIA 04/06/2010   Iron deficiency anemia 09/22/2009   Palpitations with regular cardiac rhythm 09/22/2009   FATIGUE 08/31/2009   HYPOGLYCEMIA, UNSPECIFIED 11/20/2008   MENORRHAGIA 09/05/2007   DEPRESSION, MAJOR, RECURRENT 01/02/2006   Migraine headache 01/02/2006   IRRITABLE BOWEL SYNDROME 01/02/2006   INSOMNIA NOS 01/02/2006    PCP: Dr Beatrice Lecher REFERRING PROVIDER: Dr Aundria Mems REFERRING DIAG: Lumbar DDD Rationale for Evaluation and Treatment: Rehabilitation THERAPY DIAG:  Other low back pain  Other symptoms and signs involving the musculoskeletal system  Other abnormalities of gait and mobility  Muscle weakness (generalized)  Lumbar degenerative disc disease  ONSET DATE: 04/10/22  SUBJECTIVE:  SUBJECTIVE STATEMENT: The patient reports continued pain in LB and LE's Lt > Rt. She went back to work today and is really tired. Willing to try aquatic therapy. She did tolerate new exercises better.   EVAL:Patient reports that she started having LBP and pain into the Lt LE for the past 3 weeks with no known injury. She has not been able to work in the past four days.   PERTINENT HISTORY:  Long standing LBP for ~ 10 years; history of multiple injections; last injection 3/23 with possible dural puncture requiring 3 epidural blood patches; occasional Lt sided radicular pain; subjective weakness; fibromyalgia; hyperalgesia; allodynia to light touch; chronic kidney disease; hashimoto's disease; Shoring's disease;     PAIN:  Are you having pain? Yes: NPRS scale: 7/10 Pain location: Lt leg posteriorly into back of knee area  Pain description: dull Aggravating factors: sitting; bending; lifting; prolonged standing > 30  min  Relieving factors: lying down with LE's propped with back angled with pillows; heat (has used TENS with no help)  PRECAUTIONS: None  WEIGHT BEARING RESTRICTIONS: No  FALLS:  Has patient fallen in last 6 months? 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 Conus medullaris and cauda equina: Conus extends to the L2 level. Conus and cauda equina appear normal. Paraspinal and other soft tissues: Negative for perispinal mass or inflammation.  Disc levels: L4-5: Disc space narrowing that is mild. Circumferential disc bulging. No neural compression.  L5-S1: Mild disc bulging. Degenerative facet spurring asymmetric to the right.  IMPRESSION: Overall mild degeneration as described.  No neural impingement.  MUSCLE LENGTH: Hamstrings: Right 70 deg; Left 30 deg  POSTURE: rounded shoulders, forward head, decreased lumbar lordosis, increased thoracic kyphosis, flexed trunk , and weight shift left  PALPATION: Muscular tightness Lt > Rt lumbar paraspinals; QL; lats; piriformis; gluts to HS   LUMBAR ROM:   AROM eval  Flexion 10% pain   Extension Neutral pain  Right lateral flexion 15%  Left lateral flexion 25% pain  Right rotation 5%  Left rotation 5%   (Blank rows = not tested)  LOWER EXTREMITY ROM:     Active  Right eval Left eval  Hip flexion [facility]  Hip extension neutral neutral  Hip abduction    Hip adduction    Hip internal rotation    Hip external rotation Tight  Tight   Knee flexion [facility] WFL  Knee extension [facility]  Ankle dorsiflexion    Ankle plantarflexion    Ankle inversion    Ankle eversion     (Blank rows = not tested)  LOWER  EXTREMITY MMT:  strength not assessed resistively; pt moves LE's against gravity, experiences increased pain with attempt to test strength   LUMBAR SPECIAL TESTS:  Straight leg raise test: (+) Lt 30 deg; (-) Rt Slump test: increased pain Lt LE  BALANCE:   SLS Rt 3 sec; Lt 1 sec   [facility] Adult PT Treatment:  DATE: 05/05/22  Therapeutic Exercise: Prone Glut sets reviewed for HEP PROM hip IR/ER Hamstring isometric with PT resistance at submaximal level to engage musculature L side Supine Hip adductor isometrics Marching from feet supported on bolster x 10  PROM HS stretch Nerve glides with ankle DF/PF at submaximal L HS stretch Shoulder flexion (some pain Lt shoulder) switched to elbow bend shoulder flexion to ~ 90 deg x 10  Supine T UE's at ~ 70 deg abduction ~ 1 min  Gentle lumbar rocking Manual Therapy: STM Trigger Point Dry-Needling  Treatment instructions: Expect mild to moderate muscle soreness. Patient verbalized understanding of these instructions and education. Patient Consent Given: Yes Education handout provided: Previously provided Muscles treated: L glut med, L lumbar multifidi, L glut max / piriformisL QL Estim - mAmp current intensity to pt tolerance  Treatment response/outcome: palpable lengthening Modalities: Heat and interferential e-stim x 20 minutes L QL and glut region to reduce pain  [facility] Adult PT Treatment:                                                DATE: 05/05/22  Therapeutic Exercise: Prone Glut sets reviewed for HEP On elbows-- increases pain so did not continue this position PROM hip IR/ER Hamstring isometric with PT resistance at submaximal level to engage musculature L side Supine Hip adductor isometrics Marching Bridge-- not able to tolerate due to pain PROM HS stretch Nerve glides with ankle DF/PF at submaximal L HS stretch Gentle lumbar rocking Manual Therapy: STM Trigger Point Dry-Needling   Treatment instructions: Expect mild to moderate muscle soreness. Patient verbalized understanding of these instructions and education. Patient Consent Given: Yes Education handout provided: Previously provided Muscles treated: L glut med, L lumbar multifidi, L glut max / piriformis Treatment response/outcome: palpable lengthening Modalities: Heat and interferential e-stim x 10 minutes L QL and glut region to reduce pain                                                                                                                          [facility] Adult PT Treatment:                                                DATE: 05/02/22: Therapeutic Exercise: Prone glut set 5 sec x 5  Prone - Knee flexion x 3 each LE to pt tolerance  Supine diaphragmatic breathing  Gentle nerve mobilization  Manual Therapy: Gentle soft tissue work through the lumbar spine into the lt posterior hip w/ pt prone  Therapeutic Activity: Modifications for sleeping ans rest positions  Self Care: Back care education re- transitional movements sit to stand and sit <>supine; rolling   PATIENT EDUCATION:  Education details:  POC; HEP  Person educated: Patient Education method: Explanation, Demonstration, Tactile cues, Verbal cues, and Handouts Education comprehension: verbalized understanding, returned demonstration, verbal cues required, tactile cues required, and needs further education  HOME EXERCISE PROGRAM: Access Code: [email or web address] URL: [email or web address] Date: 05/05/2022 Prepared by: Rudell Cobb  Exercises - Prone Gluteal Sets  - 1 x daily - 7 x weekly - 1 sets - 10 reps - 10 sec  hold - Prone Knee Flexion AROM  - 1 x daily - 7 x weekly - 1 sets - 5-10 reps - 1-2 sec  hold - Supine Diaphragmatic Breathing  - 1 x daily - 7 x weekly - 1 sets - 10 reps - 4-6 sec  hold - Supine March  - 1 x daily - 7 x weekly - 1 sets - 5 reps - Hooklying Single Knee to Chest Stretch  - 1 x daily - 7 x weekly - 1  sets - 3 reps - 20-30 seconds hold - Supine Sciatic Nerve Glide  - 1 x daily - 7 x weekly - 1 sets - 8-10 reps - 1-2 sec  hold  ASSESSMENT:  CLINICAL IMPRESSION: Patient returned to work today and is tired with some incresed pain. She continues to be limited by pain with exercises. PT performed gentle STM and TP work Lt psoas/lateral trunk musculature and dry needling in L QL, glut med, glut max and lumbar multifidi. Patient tolerated treatment well. Continued with core activation with supine marching and knee to chest Plan to continue working to LTG and will modify to patient tolerance.  EVAL: Patient is a 57 y.o. female who was seen today for physical therapy evaluation and treatment for Lumbar DDD. She has a long standing history of LBP with occasional Lt LE radicular symptoms. She has significant limitations in lumbar and LE mobility,ROM, strength; neural tension Lt LE; poor posture and alignment; limited functional activity tolerance; antalgic gait; poor postures and positions for rest and work.   OBJECTIVE IMPAIRMENTS: Abnormal gait, decreased activity tolerance, decreased balance, decreased endurance, decreased mobility, decreased ROM, decreased strength, hypomobility, increased fascial restrictions, increased muscle spasms, impaired flexibility, improper body mechanics, postural dysfunction, and pain.   GOALS: Goals reviewed with patient? Yes  SHORT TERM GOALS: Target date: 05/30/2022  Independent initial  HEP Baseline: Goal status: INITIAL  2.  Patient demonstrates and verbalizes proper transfers and transitions movement to protect back  Baseline:  Goal status: INITIAL   LONG TERM GOALS: Target date: 06/27/2022  Improve spinal and LE mobility to WFL's  Baseline:  Goal status: INITIAL  2.  Decrease pain by 25-50% allowing patient to return to more normal functional activities  Baseline:  Goal status: INITIAL  3.  Improve core strength and stability allowing patient to exercise  for 20 min without increased symptoms  Baseline:  Goal status: INITIAL  4.  Improve nerve mobility Lt LE  Baseline:  Goal status: INITIAL  5.  Independent in HEP including aquatic therapy as indicated  Baseline:  Goal status: INITIAL  6.  Improve functional limitation score to 48 Baseline:  Goal status: INITIAL  PLAN:  PT FREQUENCY: 2x/week  PT DURATION: 8 weeks  PLANNED INTERVENTIONS: Therapeutic exercises, Therapeutic activity, Neuromuscular re-education, Balance training, Gait training, Patient/Family education, Self Care, Joint mobilization, Stair training, Aquatic Therapy, Dry Needling, Electrical stimulation, Spinal mobilization, Cryotherapy, Moist heat, Taping, Traction, Ultrasound, Ionotophoresis 56m/ml Dexamethasone, Manual therapy, and Re-evaluation.  PLAN FOR NEXT SESSION: continue DN, STM to pt tolerance;   Review and progress exercise;  continue with back care education; manual work, DN, modalities as indicated   Brezlyn Manrique Nilda Simmer, PT 05/10/2022, 3:07 PM

## 2022-05-11 ENCOUNTER — Telehealth: Payer: Self-pay

## 2022-05-11 NOTE — Telephone Encounter (Signed)
Patient came into office to drop off FMLA paperwork, expressed to patient there could be a possible fee, forms placed in Dr. Lacie Scotts box,thanks.

## 2022-05-12 ENCOUNTER — Encounter: Payer: Self-pay | Admitting: Rehabilitative and Restorative Service Providers"

## 2022-05-12 ENCOUNTER — Ambulatory Visit: Payer: BC Managed Care – PPO | Admitting: Rehabilitative and Restorative Service Providers"

## 2022-05-12 DIAGNOSIS — M5459 Other low back pain: Secondary | ICD-10-CM

## 2022-05-12 DIAGNOSIS — R2689 Other abnormalities of gait and mobility: Secondary | ICD-10-CM

## 2022-05-12 DIAGNOSIS — R29898 Other symptoms and signs involving the musculoskeletal system: Secondary | ICD-10-CM

## 2022-05-12 DIAGNOSIS — M5136 Other intervertebral disc degeneration, lumbar region: Secondary | ICD-10-CM | POA: Diagnosis not present

## 2022-05-12 DIAGNOSIS — M6281 Muscle weakness (generalized): Secondary | ICD-10-CM

## 2022-05-12 NOTE — Therapy (Addendum)
OUTPATIENT PHYSICAL THERAPY THORACOLUMBAR TREATMENT and DISCHARGE SUMMARY  Patient Name: Carol Diaz MRN: [id number] DOB:Nov 01, 1965, 57 y.o., female Today's Date: 05/12/2022   PHYSICAL THERAPY DISCHARGE SUMMARY  Visits from Start of Care: 4  Current functional level related to goals / functional outcomes: See note below for last known status-- patient did not return to PT   Remaining deficits: Continued with significant pain   Education / Equipment: HEP to tolerance   Patient agrees to discharge. Patient goals were not met. Patient is being discharged due to not returning since the last visit.  END OF SESSION:  PT End of Session - 05/12/22 1021     Visit Number 4    Number of Visits 16    Date for PT Re-Evaluation 06/27/22    Authorization Type [employer] state plan    PT Start Time 1022    PT Stop Time 1115    PT Time Calculation (min) 53 min    Activity Tolerance Patient limited by pain;Patient tolerated treatment well             Past Medical History:  Diagnosis Date   Chronic fatigue syndrome    Hypertension    Migraines    Palpitations    Renal insufficiency    Sjogren's disease ([place]) 03/17/2020   Past Surgical History:  Procedure Laterality Date   CESAREAN SECTION     ivp     LAPAROSCOPIC APPENDECTOMY N/A 05/17/2017   Procedure: DIAGNOSTIC LAPAROSCOPY APPENDECTOMY;  Surgeon: Leighton Ruff, MD;  Location: [facility];  Service: General;  Laterality: N/A;   TUBAL LIGATION     Patient Active Problem List   Diagnosis Date Noted   Numbness and tingling of both feet 05/09/2022   Memory changes 05/09/2022   Lumbar degenerative disc disease 04/28/2022   Acute midline low back pain 03/23/2022   Fluid retention in legs 03/23/2022   Exertional dyspnea 03/23/2022   Nocturnal polyuria 03/23/2022   Urticaria due to heat 01/03/2022   Low serum cortisol level 05/02/2021   Hashimoto's disease 05/02/2021   Elevated antithyroglobulin IgG 04/08/2021   GERD  (gastroesophageal reflux disease) 11/25/2020   Renal insufficiency    Hypertension    Fatigue    Sjogren's disease ([place]) 03/17/2020   Thiamin deficiency 09/30/2019   Localized swelling of both lower extremities 08/01/2019   Blue toes 08/01/2019   Tachycardia 06/13/2019   Abnormal ankle brachial index (ABI) 10/07/2018   Fibromyalgia 07/17/2016   CKD (chronic kidney disease) stage 2, GFR 60-89 ml/min 09/27/2015   Neck pain 06/10/2015   Obesity 09/25/2013   Essential tremor 02/02/2012   Hyperlipidemia 12/15/2011   GROSS HEMATURIA 04/06/2010   Iron deficiency anemia 09/22/2009   Palpitations with regular cardiac rhythm 09/22/2009   FATIGUE 08/31/2009   HYPOGLYCEMIA, UNSPECIFIED 11/20/2008   MENORRHAGIA 09/05/2007   DEPRESSION, MAJOR, RECURRENT 01/02/2006   Migraine headache 01/02/2006   IRRITABLE BOWEL SYNDROME 01/02/2006   INSOMNIA NOS 01/02/2006    PCP: Dr Beatrice Lecher REFERRING PROVIDER: Dr Aundria Mems REFERRING DIAG: Lumbar DDD Rationale for Evaluation and Treatment: Rehabilitation THERAPY DIAG:  Other low back pain  Other symptoms and signs involving the musculoskeletal system  Other abnormalities of gait and mobility  Muscle weakness (generalized)  ONSET DATE: 04/10/22  SUBJECTIVE:  SUBJECTIVE STATEMENT: The patient reports she is not as sore as she was the last time. Willing to try aquatic therapy.   EVAL:Patient reports that she started having LBP and pain into the Lt LE for the past 3 weeks with no known injury. She has not been able to work in the past four days.   PERTINENT HISTORY:  Long standing LBP for ~ 10 years; history of multiple injections; last injection 3/23 with possible dural puncture requiring 3 epidural blood patches; occasional Lt sided radicular  pain; subjective weakness; fibromyalgia; hyperalgesia; allodynia to light touch; chronic kidney disease; hashimoto's disease; Shoring's disease;     PAIN:  Are you having pain? Yes: NPRS scale: 4/10 Pain location: Lt leg posteriorly into back of knee area  Pain description: dull Aggravating factors: sitting; bending; lifting; prolonged standing > 30 min  Relieving factors: lying down with LE's propped with back angled with pillows; heat (has used TENS with no help)  PRECAUTIONS: None  WEIGHT BEARING RESTRICTIONS: No  FALLS:  Has patient fallen in last 6 months? 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 Conus medullaris and cauda equina: Conus extends to the L2 level. Conus and cauda equina appear normal. Paraspinal and other soft tissues: Negative for perispinal mass or inflammation.  Disc levels: L4-5: Disc space narrowing that is mild. Circumferential disc bulging. No neural compression.  L5-S1: Mild disc bulging. Degenerative facet spurring asymmetric to the right.  IMPRESSION: Overall mild degeneration as described.  No neural impingement.  MUSCLE LENGTH: Hamstrings: Right 70 deg; Left 30 deg  POSTURE: rounded shoulders, forward head, decreased lumbar lordosis, increased thoracic kyphosis, flexed trunk , and weight shift left  PALPATION: Muscular tightness Lt > Rt lumbar paraspinals; QL; lats; piriformis; gluts to HS   LUMBAR ROM:   AROM eval  Flexion 10% pain   Extension Neutral pain  Right lateral flexion 15%  Left lateral flexion 25% pain  Right rotation 5%  Left rotation 5%   (Blank rows = not tested)  LOWER EXTREMITY ROM:     Active   Right eval Left eval  Hip flexion [facility]  Hip extension neutral neutral  Hip abduction    Hip adduction    Hip internal rotation    Hip external rotation Tight  Tight   Knee flexion [facility] WFL  Knee extension [facility]  Ankle dorsiflexion    Ankle plantarflexion    Ankle inversion    Ankle eversion     (Blank rows = not tested)  LOWER EXTREMITY MMT:  strength not assessed resistively; pt moves LE's against gravity, experiences increased pain with attempt to test strength   LUMBAR SPECIAL TESTS:  Straight leg raise test: (+) Lt 30 deg; (-) Rt Slump test: increased pain Lt LE  BALANCE:   SLS Rt 3 sec; Lt 1 sec   [facility] Adult PT Treatment:                                                DATE: 05/12/22 Therapeutic  Exercise: Standing Wall lean with cat/cow for lumbar mobility Quadriped Cat/cow x 5 reps with tactile and verbal cues for rounding, and flattening of the back Prone Glut set x 3 reps Hip extension trialed-- however painful on L side PROM hip IR/ER (L hip IR is painful to low back) Supine Hip adductor isometrics with PT's resistance x 5 reps x 5 second holds Hip abductor isometrics are too painful and stopped Bent knee fallouts with R LE significantly more mobile than L LE Figure 4 stretch L LE within tolerable range Marching x 10 reps PROM HS stretch Gentle lumbar rocking Manual Therapy: STM L hamstrings, L glut medius, L paraspinals in prone Trigger Point Dry-Needling  Treatment instructions: Expect mild to moderate muscle soreness. Patient verbalized understanding of these instructions and education. Patient Consent Given: Yes Education handout provided: Previously provided Muscles treated: L glut med, L hamstring, L multifidi lumbar region Treatment response/outcome: palpable lengthening Modalities: Heat and interferential e-stim x 10 minutes L QL and glut region to reduce pain  [facility] Adult PT Treatment:                                                DATE:  05/10/22  Therapeutic Exercise: Prone Glut sets reviewed for HEP PROM hip IR/ER Hamstring isometric with PT resistance at submaximal level to engage musculature L side Supine Hip adductor isometrics Marching from feet supported on bolster x 10  PROM HS stretch Nerve glides with ankle DF/PF at submaximal L HS stretch Shoulder flexion (some pain Lt shoulder) switched to elbow bend shoulder flexion to ~ 90 deg x 10  Supine T UE's at ~ 70 deg abduction ~ 1 min  Gentle lumbar rocking Manual Therapy: STM Trigger Point Dry-Needling  Treatment instructions: Expect mild to moderate muscle soreness. Patient verbalized understanding of these instructions and education. Patient Consent Given: Yes Education handout provided: Previously provided Muscles treated: L glut med, L lumbar multifidi, L glut max / piriformisL QL Estim - mAmp current intensity to pt tolerance  Treatment response/outcome: palpable lengthening Modalities: Heat and interferential e-stim x 20 minutes L QL and glut region to reduce pain  PATIENT EDUCATION:  Education details: POC; HEP  Person educated: Patient Education method: Consulting civil engineer, Demonstration, Corporate treasurer cues, Verbal cues, and Handouts Education comprehension: verbalized understanding, returned demonstration, verbal cues required, tactile cues required, and needs further education  HOME EXERCISE PROGRAM: Access Code: [email or web address] URL: [email or web address] Date: 05/05/2022 Prepared by: Rudell Cobb  Exercises - Prone Gluteal Sets  - 1 x daily - 7 x weekly - 1 sets - 10 reps - 10 sec  hold - Prone Knee Flexion AROM  - 1 x daily - 7 x weekly - 1 sets - 5-10 reps - 1-2 sec  hold - Supine Diaphragmatic Breathing  - 1 x daily - 7 x weekly - 1 sets - 10 reps - 4-6 sec  hold - Supine March  - 1 x daily - 7 x weekly - 1 sets - 5 reps - Hooklying Single Knee to Chest Stretch  - 1 x daily - 7 x weekly - 1 sets - 3 reps - 20-30 seconds hold - Supine  Sciatic Nerve Glide  - 1 x daily - 7 x weekly - 1 sets - 8-10 reps - 1-2 sec  hold  ASSESSMENT:  CLINICAL IMPRESSION: The patient tolerated quadriped well today. Hip  IR on the L irriates L low back and glut region. PT slowly initiating more hip there ex as well. We also did STM and DN into L hamstrings today.  She continues to be limited by pain with exercises. Plan to continue working to LTG and will modify to patient tolerance.  EVAL: Patient is a 57 y.o. female who was seen today for physical therapy evaluation and treatment for Lumbar DDD. She has a long standing history of LBP with occasional Lt LE radicular symptoms. She has significant limitations in lumbar and LE mobility,ROM, strength; neural tension Lt LE; poor posture and alignment; limited functional activity tolerance; antalgic gait; poor postures and positions for rest and work.   OBJECTIVE IMPAIRMENTS: Abnormal gait, decreased activity tolerance, decreased balance, decreased endurance, decreased mobility, decreased ROM, decreased strength, hypomobility, increased fascial restrictions, increased muscle spasms, impaired flexibility, improper body mechanics, postural dysfunction, and pain.   GOALS: Goals reviewed with patient? Yes  SHORT TERM GOALS: Target date: 05/30/2022  Independent initial  HEP Baseline: Goal status: INITIAL  2.  Patient demonstrates and verbalizes proper transfers and transitions movement to protect back  Baseline:  Goal status: INITIAL   LONG TERM GOALS: Target date: 06/27/2022  Improve spinal and LE mobility to WFL's  Baseline:  Goal status: INITIAL  2.  Decrease pain by 25-50% allowing patient to return to more normal functional activities  Baseline:  Goal status: INITIAL  3.  Improve core strength and stability allowing patient to exercise for 20 min without increased symptoms  Baseline:  Goal status: INITIAL  4.  Improve nerve mobility Lt LE  Baseline:  Goal status: INITIAL  5.   Independent in HEP including aquatic therapy as indicated  Baseline:  Goal status: INITIAL  6.  Improve functional limitation score to 48 Baseline:  Goal status: INITIAL  PLAN:  PT FREQUENCY: 2x/week  PT DURATION: 8 weeks  PLANNED INTERVENTIONS: Therapeutic exercises, Therapeutic activity, Neuromuscular re-education, Balance training, Gait training, Patient/Family education, Self Care, Joint mobilization, Stair training, Aquatic Therapy, Dry Needling, Electrical stimulation, Spinal mobilization, Cryotherapy, Moist heat, Taping, Traction, Ultrasound, Ionotophoresis 4mg /ml Dexamethasone, Manual therapy, and Re-evaluation.  PLAN FOR NEXT SESSION: continue DN, STM to pt tolerance;   Review and progress exercise; continue with back care education; manual work, DN, modalities as indicated. Give pool handoutRudell Cobb, PT 05/12/2022, 1:41 PM

## 2022-05-13 LAB — COMPLETE METABOLIC PANEL WITH GFR
AG Ratio: 1.6 (calc) (ref 1.0–2.5)
ALT: 17 U/L (ref 6–29)
AST: 19 U/L (ref 10–35)
Albumin: 4.1 g/dL (ref 3.6–5.1)
Alkaline phosphatase (APISO): 109 U/L (ref 37–153)
BUN: 11 mg/dL (ref 7–25)
CO2: 27 mmol/L (ref 20–32)
Calcium: 10.1 mg/dL (ref 8.6–10.4)
Chloride: 110 mmol/L (ref 98–110)
Creat: 1.01 mg/dL (ref 0.50–1.03)
Globulin: 2.6 g/dL (calc) (ref 1.9–3.7)
Glucose, Bld: 75 mg/dL (ref 65–99)
Potassium: 4.2 mmol/L (ref 3.5–5.3)
Sodium: 144 mmol/L (ref 135–146)
Total Bilirubin: 0.3 mg/dL (ref 0.2–1.2)
Total Protein: 6.7 g/dL (ref 6.1–8.1)
eGFR: 65 mL/min/{1.73_m2} (ref >=60)

## 2022-05-13 LAB — CBC
HCT: 40.4 % (ref 35.0–45.0)
Hemoglobin: 13.5 g/dL (ref 11.7–15.5)
MCH: 29.2 pg (ref 27.0–33.0)
MCHC: 33.4 g/dL (ref 32.0–36.0)
MCV: 87.3 fL (ref 80.0–100.0)
MPV: 10.9 fL (ref 7.5–12.5)
Platelets: 280 10*3/uL (ref 140–400)
RBC: 4.63 10*6/uL (ref 3.80–5.10)
RDW: 14 % (ref 11.0–15.0)
WBC: 5.7 10*3/uL (ref 3.8–10.8)

## 2022-05-13 LAB — VITAMIN B6: Vitamin B6: 11.3 ng/mL (ref 2.1–21.7)

## 2022-05-13 LAB — MAGNESIUM: Magnesium: 2.1 mg/dL (ref 1.5–2.5)

## 2022-05-13 LAB — VITAMIN B12: Vitamin B-12: 623 pg/mL (ref 200–1100)

## 2022-05-13 LAB — VITAMIN B1: Vitamin B1 (Thiamine): 13 nmol/L (ref 8–30)

## 2022-05-15 ENCOUNTER — Ambulatory Visit (INDEPENDENT_AMBULATORY_CARE_PROVIDER_SITE_OTHER): Payer: BC Managed Care – PPO

## 2022-05-15 ENCOUNTER — Encounter: Payer: BC Managed Care – PPO | Admitting: Rehabilitative and Restorative Service Providers"

## 2022-05-15 DIAGNOSIS — R413 Other amnesia: Secondary | ICD-10-CM | POA: Diagnosis not present

## 2022-05-15 DIAGNOSIS — R5383 Other fatigue: Secondary | ICD-10-CM

## 2022-05-15 DIAGNOSIS — R519 Headache, unspecified: Secondary | ICD-10-CM

## 2022-05-15 DIAGNOSIS — R202 Paresthesia of skin: Secondary | ICD-10-CM | POA: Diagnosis not present

## 2022-05-15 DIAGNOSIS — R251 Tremor, unspecified: Secondary | ICD-10-CM

## 2022-05-15 MED ORDER — GADOBUTROL 1 MMOL/ML IV SOLN
7.5000 mL | Freq: Once | INTRAVENOUS | Status: AC | PRN
  Administered 2022-05-15: 7.5 mL via INTRAVENOUS

## 2022-05-15 NOTE — Progress Notes (Signed)
Hi Carol Diaz, B1, B6, B12 and magnesium look good.  It looks like you have been scheduled for your MRI.  Hopefully we will get that report back in the next day or 2.

## 2022-05-16 ENCOUNTER — Other Ambulatory Visit: Payer: Self-pay | Admitting: Family Medicine

## 2022-05-16 NOTE — Telephone Encounter (Signed)
Please call pt and inform her that her forms are ready and there is a fee.  Forms placed up front

## 2022-05-17 ENCOUNTER — Other Ambulatory Visit: Payer: Self-pay | Admitting: Family Medicine

## 2022-05-17 ENCOUNTER — Encounter: Payer: BC Managed Care – PPO | Admitting: Rehabilitative and Restorative Service Providers"

## 2022-05-17 NOTE — Telephone Encounter (Signed)
Attempted to contact patient regarding the med refill. No answer. Patient has been requested to return a call back. Direct call back info provided.

## 2022-05-17 NOTE — Telephone Encounter (Signed)
This med was not rx by me.  It look like Urgent care. Why is she wanting a refill?

## 2022-05-17 NOTE — Telephone Encounter (Signed)
Last OV: 05/09/22 Next OV: none scheduled Last RF: 06/09/21

## 2022-05-17 NOTE — Progress Notes (Signed)
Hi Carol Diaz,   The brain MRI actually looks good.  The thickening and enlargement all looks like it has resolved.  It looks like the blood pressure has completely improved.  Sign of a sinus infection.  So I definitely want to move forward with getting you in with a neurologist especially for the headaches and numbness and weakness.  They did not see any mass or tumors which is also very reassuring.

## 2022-05-17 NOTE — Progress Notes (Signed)
There is no active infection in the sinuses. Just the lining of the membranes are swollen. This can come from allergies but there is no fluid.

## 2022-05-22 ENCOUNTER — Encounter: Payer: BC Managed Care – PPO | Admitting: Rehabilitative and Restorative Service Providers"

## 2022-05-23 NOTE — Telephone Encounter (Signed)
Last ov : 02/

## 2022-05-24 ENCOUNTER — Encounter: Payer: BC Managed Care – PPO | Admitting: Rehabilitative and Restorative Service Providers"

## 2022-05-26 ENCOUNTER — Ambulatory Visit: Payer: BC Managed Care – PPO | Admitting: Sports Medicine

## 2022-05-29 ENCOUNTER — Ambulatory Visit: Payer: BC Managed Care – PPO | Admitting: Sports Medicine

## 2022-05-31 ENCOUNTER — Ambulatory Visit (HOSPITAL_BASED_OUTPATIENT_CLINIC_OR_DEPARTMENT_OTHER): Payer: BC Managed Care – PPO | Admitting: Physical Therapy

## 2022-06-02 ENCOUNTER — Ambulatory Visit: Payer: BC Managed Care – PPO | Admitting: Sports Medicine

## 2022-06-06 ENCOUNTER — Encounter: Payer: Self-pay | Admitting: Family Medicine

## 2022-06-06 NOTE — Telephone Encounter (Signed)
I did write for continuous but if she would like me to change it I can

## 2022-06-07 ENCOUNTER — Ambulatory Visit: Payer: BC Managed Care – PPO | Admitting: Sports Medicine

## 2022-06-07 DIAGNOSIS — M5136 Other intervertebral disc degeneration, lumbar region: Secondary | ICD-10-CM

## 2022-06-07 DIAGNOSIS — M51369 Other intervertebral disc degeneration, lumbar region without mention of lumbar back pain or lower extremity pain: Secondary | ICD-10-CM

## 2022-06-07 NOTE — Assessment & Plan Note (Signed)
Pleasant 57 year old female, long history of axial low back pain with occasional left-sided radicular symptoms from the buttock all the way down the back of the leg to the foot. She has had some epidurals with Dr. Trula Ore in the past. Sounds like she had a dural puncture in the past as well and is adamant against spinal injections at this point. She does have significant hyperalgesia, allodynia. We did obtain an MRI at the last visit that showed disc disease with disc protrusions at L4-5 and L5-S1 without any evidence of neuroforaminal stenosis. I increased her gabapentin and increased her Cymbalta, she reports some improvement potentially in her back pain but no improvement in the leg discomfort. I am perplexed as to what could be causing her leg pain in the absence of neuroforaminal stenosis in her lumbar spine MRI, so I would like her to return to Dr. Trula Ore for a left lower extremity nerve conduction study and electromyography. I did try to prepare her for the likelihood that we would not find a discrete area of nerve compression, and that fibromyalgia may be at play here, and that we would do our best to manage her symptoms but would likely be unable to cure them.

## 2022-06-07 NOTE — Progress Notes (Signed)
    Procedures performed today:    None.  Independent interpretation of notes and tests performed by another provider:   None.  Brief History, Exam, Impression, and Recommendations:    Lumbar degenerative disc disease Pleasant 57 year old female, long history of axial low back pain with occasional left-sided radicular symptoms from the buttock all the way down the back of the leg to the foot. She has had some epidurals with Dr. Trula Ore in the past. Sounds like she had a dural puncture in the past as well and is adamant against spinal injections at this point. She does have significant hyperalgesia, allodynia. We did obtain an MRI at the last visit that showed disc disease with disc protrusions at L4-5 and L5-S1 without any evidence of neuroforaminal stenosis. I increased her gabapentin and increased her Cymbalta, she reports some improvement potentially in her back pain but no improvement in the leg discomfort. I am perplexed as to what could be causing her leg pain in the absence of neuroforaminal stenosis in her lumbar spine MRI, so I would like her to return to Dr. Trula Ore for a left lower extremity nerve conduction study and electromyography. I did try to prepare her for the likelihood that we would not find a discrete area of nerve compression, and that fibromyalgia may be at play here, and that we would do our best to manage her symptoms but would likely be unable to cure them.    ____________________________________________ Gwen Her. Dianah Field, M.D., ABFM., CAQSM., AME. Primary Care and Sports Medicine [place] [facility]  Adjunct Professor of [place] [employer] [facility]  Risk manager

## 2022-06-08 ENCOUNTER — Encounter: Payer: Self-pay | Admitting: Family Medicine

## 2022-06-12 NOTE — Telephone Encounter (Signed)
Pt called.  Following up on FMLA(ammendment) HR is breathing down her neck.

## 2022-06-12 NOTE — Telephone Encounter (Signed)
Completed and placed in Tony B basket 

## 2022-06-23 ENCOUNTER — Ambulatory Visit: Payer: BC Managed Care – PPO | Admitting: Diagnostic Neuroimaging

## 2022-06-23 ENCOUNTER — Encounter: Payer: Self-pay | Admitting: Diagnostic Neuroimaging

## 2022-06-23 VITALS — BP 114/72 | HR 64 | Ht 66.0 in | Wt 148.0 lb

## 2022-06-23 DIAGNOSIS — G43109 Migraine with aura, not intractable, without status migrainosus: Secondary | ICD-10-CM | POA: Diagnosis not present

## 2022-06-23 DIAGNOSIS — G250 Essential tremor: Secondary | ICD-10-CM | POA: Diagnosis not present

## 2022-06-23 NOTE — Progress Notes (Signed)
[facility]  PATIENT: Carol Diaz DOB: 1965-10-26  REFERRING CLINICIAN: Hali Marry, * HISTORY FROM: patient  REASON FOR VISIT: new consult   HISTORICAL  CHIEF COMPLAINT:  Chief Complaint  Patient presents with   New Patient (Initial Visit)    Rm 6, alone Migraines: 2 times per week. Triggers include: stress, fatigue Tremor: pt feels like they are increasingly worse the older she gets but that she has always had them     HISTORY OF PRESENT ILLNESS:   57 year old female here for evaluation of headaches and tremor.  History of migraine headaches, currently on topiramate and sumatriptan.  She was having 2-4 migraines per month but this increased following March 2023 epidural procedure with some spinal fluid leak.  She developed low pressure headaches requiring 2 blood patches.  Eventually those symptoms have improved.  Now having about 2 migraine headaches per week.  She has frontal throbbing headaches with floaters, nausea and sensitivity to light.  Also with some postural tremor, diagnosis of essential tremor.  Family history of essential tremor and 1 brother, 1 sister and 1 paternal uncle.  She has been on primidone which caused side effects.  She is on propranolol for blood pressure control.   REVIEW OF SYSTEMS: Full 14 system review of systems performed and negative with exception of: as per HPI.  ALLERGIES: Allergies  Allergen Reactions   Fluoxetine Other (See Comments)    Made her tremor worse    Lexapro [Escitalopram Oxalate] Other (See Comments)    Headache   Morphine Nausea And Vomiting   Sertraline Other (See Comments)    Headache     HOME MEDICATIONS: Outpatient Medications Prior to Visit  Medication Sig Dispense Refill   cyanocobalamin (,VITAMIN B-12,) 1000 MCG/ML injection every 30 (thirty) days.     donepezil (ARICEPT) 10 MG tablet Take by mouth.     DULoxetine (CYMBALTA) 30 MG capsule Take 1 capsule (30 mg total) by  mouth daily. 90 capsule 3   fluticasone (FLONASE) 50 MCG/ACT nasal spray Place 2 sprays into both nostrils daily. (Patient taking differently: Place 2 sprays into both nostrils as needed.) 16 g 0   gabapentin (NEURONTIN) 300 MG capsule One tab PO qHS for a week, then BID for a week, then TID. May double weekly to a max of 3,600mg /day 90 capsule 3   hydrochlorothiazide (HYDRODIURIL) 12.5 MG tablet Take 12.5 mg by mouth every morning.     HYDROcodone-acetaminophen (NORCO) 7.5-325 MG tablet Take 1 tablet by mouth 3 (three) times daily as needed.     hydroxychloroquine (PLAQUENIL) 200 MG tablet Take 200 mg by mouth 2 (two) times daily.     LINZESS 290 MCG CAPS capsule TAKE 1 CAPSULE(290 MCG) BY MOUTH DAILY BEFORE BREAKFAST 90 capsule 3   ondansetron (ZOFRAN) 8 MG tablet Take 1 tablet (8 mg total) by mouth every 8 (eight) hours as needed for nausea or vomiting. 10 tablet 0   potassium chloride SA (KLOR-CON M) 20 MEQ tablet Take 1 tablet (20 mEq total) by mouth 2 (two) times daily. 10 tablet 0   predniSONE (DELTASONE) 50 MG tablet One tab PO daily for 5 days. 5 tablet 0   prochlorperazine (COMPAZINE) 10 MG tablet Take by mouth.     propranolol ER (INDERAL LA) 60 MG 24 hr capsule Take 1 capsule (60 mg total) by mouth daily. 90 capsule 0   SUMAtriptan (IMITREX) 100 MG tablet TAKE 1 TABLET BY MOUTH EVERY 2 HOURS AS NEEDED MIGRAINES AS  DIRECTED 10 tablet 5   topiramate (TOPAMAX) 50 MG tablet Take 1 tablet (50 mg total) by mouth 2 (two) times daily. Needs appointment for further refills. 60 tablet 0   Vitamin D, Ergocalciferol, (DRISDOL) 1.25 MG (50000 UNIT) CAPS capsule Take 50,000 Units by mouth once a week.     No facility-administered medications prior to visit.    PAST MEDICAL HISTORY: Past Medical History:  Diagnosis Date   Chronic fatigue syndrome    Hypertension    Migraines    Palpitations    Renal insufficiency    Sjogren's disease ([place]) 03/17/2020    PAST SURGICAL HISTORY: Past  Surgical History:  Procedure Laterality Date   CESAREAN SECTION     ivp     LAPAROSCOPIC APPENDECTOMY N/A 05/17/2017   Procedure: DIAGNOSTIC LAPAROSCOPY APPENDECTOMY;  Surgeon: Leighton Ruff, MD;  Location: [facility];  Service: General;  Laterality: N/A;   TUBAL LIGATION      FAMILY HISTORY: Family History  Problem Relation Age of Onset   Emphysema Mother    Hypertension Father    Rheum arthritis Sister    Thyroid disease Sister    Thyroid disease Sister    Diabetes Sister    Heart disease Brother 88       mi   Thyroid disease Brother    Epilepsy Daughter    Healthy Daughter    Healthy Daughter    Asthma Daughter     SOCIAL HISTORY: Social History   Socioeconomic History   Marital status: Married    Spouse name: Not on file   Number of children: 2   Years of education: Not on file   Highest education level: Not on file  Occupational History    Employer: [employer]  Tobacco Use   Smoking status: Never   Smokeless tobacco: Never  Vaping Use   Vaping Use: Never used  Substance and Sexual Activity   Alcohol use: No   Drug use: No   Sexual activity: Yes    Partners: Male    Birth control/protection: None  Other Topics Concern   Not on file  Social History Narrative   Some exercise.    Social Determinants of Health   Financial Resource Strain: Not on file  Food Insecurity: Not on file  Transportation Needs: Not on file  Physical Activity: Not on file  Stress: Not on file  Social Connections: Not on file  Intimate Partner Violence: Not on file     PHYSICAL EXAM  GENERAL EXAM/CONSTITUTIONAL: Vitals:  Vitals:   06/23/22 0934  BP: 114/72  Pulse: 64  Weight: 148 lb (67.1 kg)  Height: 5\' 6"  (1.676 m)   Body mass index is 23.89 kg/m. Wt Readings from Last 3 Encounters:  06/23/22 148 lb (67.1 kg)  05/09/22 156 lb 1.3 oz (70.8 kg)  03/23/22 163 lb 1 oz (74 kg)   Patient is in no distress; well developed, nourished and groomed; neck is  supple  CARDIOVASCULAR: Examination of carotid arteries is normal; no carotid bruits Regular rate and rhythm, no murmurs Examination of peripheral vascular system by observation and palpation is normal  EYES: Ophthalmoscopic exam of optic discs and posterior segments is normal; no papilledema or hemorrhages No results found.  MUSCULOSKELETAL: Gait, strength, tone, movements noted in Neurologic exam below  NEUROLOGIC: MENTAL STATUS:      No data to display         awake, alert, oriented to person, place and time recent and remote memory intact  normal attention and concentration language fluent, comprehension intact, naming intact fund of knowledge appropriate  CRANIAL NERVE:  2nd - no papilledema on fundoscopic exam 2nd, 3rd, 4th, 6th - pupils equal and reactive to light, visual fields full to confrontation, extraocular muscles intact, no nystagmus 5th - facial sensation symmetric 7th - facial strength symmetric 8th - hearing intact 9th - palate elevates symmetrically, uvula midline 11th - shoulder shrug symmetric 12th - tongue protrusion midline  MOTOR:  normal bulk and tone, full strength in the BUE, BLE MILD HEAD, ARM POSTURAL TREMOR NO RIGIDITY; NO REST TREMOR; NO BRADYKINESIA  SENSORY:  normal and symmetric to light touch, temperature, vibration  COORDINATION:  finger-nose-finger, fine finger movements normal  REFLEXES:  deep tendon reflexes TRACE and symmetric  GAIT/STATION:  narrow based gait     DIAGNOSTIC DATA (LABS, IMAGING, TESTING) - I reviewed patient records, labs, notes, testing and imaging myself where available.  Lab Results  Component Value Date   WBC 5.7 05/09/2022   HGB 13.5 05/09/2022   HCT 40.4 05/09/2022   MCV 87.3 05/09/2022   PLT 280 05/09/2022      Component Value Date/Time   NA 144 05/09/2022 1505   NA 140 03/23/2022 0948   K 4.2 05/09/2022 1505   CL 110 05/09/2022 1505   CO2 27 05/09/2022 1505   GLUCOSE 75  05/09/2022 1505   BUN 11 05/09/2022 1505   BUN 15 03/23/2022 0948   CREATININE 1.01 05/09/2022 1505   CALCIUM 10.1 05/09/2022 1505   PROT 6.7 05/09/2022 1505   ALBUMIN 3.6 09/03/2021 2320   AST 19 05/09/2022 1505   ALT 17 05/09/2022 1505   ALKPHOS 94 09/03/2021 2320   BILITOT 0.3 05/09/2022 1505   GFRNONAA >60 09/03/2021 2320   GFRNONAA 70 07/19/2020 0000   GFRAA 81 07/19/2020 0000   Lab Results  Component Value Date   CHOL 233 (H) 12/06/2020   HDL 81 12/06/2020   LDLCALC 127 (H) 12/06/2020   TRIG 132 12/06/2020   CHOLHDL 2.9 12/06/2020   Lab Results  Component Value Date   HGBA1C 5.7 (H) 03/23/2022   Lab Results  Component Value Date   VITAMINB12 623 05/09/2022   Lab Results  Component Value Date   TSH 1.320 03/23/2022    05/15/22 MRI brain (with and without)  1. Interval resolution of dural thickening and enhancement, pituitary enlargement, and dural venous sinus distension on the prior MRI suggestive of resolved intracranial hypotension. 2. No acute intracranial abnormality.    ASSESSMENT AND PLAN  57 y.o. year old female here with:  Dx:  1. Migraine with aura and without status migrainosus, not intractable   2. Essential tremor     PLAN:  MIGRAINE WITH AURA  MIGRAINE TREATMENT PLAN:  MIGRAINE PREVENTION  LIFESTYLE CHANGES -Stop or avoid smoking -Decrease or avoid caffeine / alcohol -Eat and sleep on a regular schedule -Exercise several times per week - continue topiramate 50mg  twice a day; drink plenty of water - may increase topiramate to 50 / 100 x 2 weeks; then 100mg  twice a day   Consider 2nd line - rimegepant (Nurtec) 75mg  every other day - atogepant (Qulipta) 60mg  daily - erenumab (Aimovig) 70mg  monthly (may increase to 140mg  monthly) - fremanezumab (Ajovy) 225mg  monthly (or 675mg  every 3 months) - galazanezumab (Emgality) 240mg  loading dose; then 120mg  monthly  MIGRAINE RESCUE  - ibuprofen, tylenol as needed - continue  sumatriptan 100mg  as needed   Consider CGRP antagonists - ubrogepant Roselyn Meier) 100mg  as needed  for breakthrough headache; may repeat x 1 after 2 hours; max 2 tabs per day or 8 per month - rimegepant (Nurtec) 75mg  as needed for breakthrough headache; max 8 per month  ESSENTIAL TREMOR (fam hx tremor in brother, sister, paternal uncle) - tried and failed primidone (side effects) - continue propranolol which can help essential tremor; can increase as tolerated based on BP and heart rate  Return in about 6 months (around 12/24/2022) for MyChart visit (15 min).    Penni Bombard, MD [id number], [id number] AM Certified in Neurology, Neurophysiology and Neuroimaging  [facility] [address] [place], [place] [address] [phone]

## 2022-06-23 NOTE — Patient Instructions (Addendum)
MIGRAINE TREATMENT PLAN:  MIGRAINE PREVENTION  LIFESTYLE CHANGES -Stop or avoid smoking -Decrease or avoid caffeine / alcohol -Eat and sleep on a regular schedule -Exercise several times per week - continue topiramate 50mg  twice a day; drink plenty of water - may increase topiramate to 50 / 100 x 2 weeks; then 100mg  twice a day   MIGRAINE RESCUE  - ibuprofen, tylenol as needed - continue sumatriptan 100mg  as needed   ESSENTIAL TREMOR (fam hx tremor in brother, sister, paternal uncle) - continue propranolol which can help essential tremor; can increase as tolerated based on BP and heart rate

## 2022-07-04 ENCOUNTER — Other Ambulatory Visit: Payer: Self-pay | Admitting: Family Medicine

## 2022-07-05 MED ORDER — PROPRANOLOL HCL ER 60 MG PO CP24: 60.0000 mg | ORAL_CAPSULE | Freq: Every day | ORAL | 1 refills | Status: DC

## 2022-12-15 ENCOUNTER — Ambulatory Visit: Payer: BC Managed Care – PPO | Admitting: Sports Medicine

## 2022-12-15 ENCOUNTER — Other Ambulatory Visit: Payer: Self-pay | Admitting: Sports Medicine

## 2022-12-15 ENCOUNTER — Other Ambulatory Visit: Payer: Self-pay | Admitting: Family Medicine

## 2022-12-15 DIAGNOSIS — M5136 Other intervertebral disc degeneration, lumbar region: Secondary | ICD-10-CM

## 2022-12-25 ENCOUNTER — Telehealth: Payer: BC Managed Care – PPO | Admitting: Diagnostic Neuroimaging

## 2022-12-25 ENCOUNTER — Telehealth: Payer: Self-pay | Admitting: Diagnostic Neuroimaging

## 2022-12-25 NOTE — Telephone Encounter (Addendum)
Pt cancelling appt due to scheduling conflict (cannot be online at that time.) pt said will call back to reschedule

## 2023-04-10 ENCOUNTER — Other Ambulatory Visit: Payer: Self-pay | Admitting: Family Medicine

## 2023-04-12 NOTE — Telephone Encounter (Signed)
Please call pt and have her schedule a f/u appointment for refills on Propranolol  please

## 2023-04-12 NOTE — Telephone Encounter (Signed)
Patient scheduled for 05/03/23, thanks.

## 2023-05-03 ENCOUNTER — Ambulatory Visit (INDEPENDENT_AMBULATORY_CARE_PROVIDER_SITE_OTHER): Payer: 59 | Admitting: Family Medicine

## 2023-05-03 ENCOUNTER — Encounter: Payer: Self-pay | Admitting: Family Medicine

## 2023-05-03 VITALS — BP 115/89 | HR 83 | Temp 98.7°F | Ht 66.0 in | Wt 145.0 lb

## 2023-05-03 DIAGNOSIS — R52 Pain, unspecified: Secondary | ICD-10-CM

## 2023-05-03 DIAGNOSIS — N182 Chronic kidney disease, stage 2 (mild): Secondary | ICD-10-CM

## 2023-05-03 DIAGNOSIS — M7062 Trochanteric bursitis, left hip: Secondary | ICD-10-CM | POA: Diagnosis not present

## 2023-05-03 DIAGNOSIS — G250 Essential tremor: Secondary | ICD-10-CM

## 2023-05-03 DIAGNOSIS — I10 Essential (primary) hypertension: Secondary | ICD-10-CM

## 2023-05-03 DIAGNOSIS — G43009 Migraine without aura, not intractable, without status migrainosus: Secondary | ICD-10-CM

## 2023-05-03 DIAGNOSIS — S76302A Unspecified injury of muscle, fascia and tendon of the posterior muscle group at thigh level, left thigh, initial encounter: Secondary | ICD-10-CM

## 2023-05-03 DIAGNOSIS — Z1231 Encounter for screening mammogram for malignant neoplasm of breast: Secondary | ICD-10-CM

## 2023-05-03 DIAGNOSIS — E782 Mixed hyperlipidemia: Secondary | ICD-10-CM

## 2023-05-03 DIAGNOSIS — M797 Fibromyalgia: Secondary | ICD-10-CM

## 2023-05-03 DIAGNOSIS — F330 Major depressive disorder, recurrent, mild: Secondary | ICD-10-CM

## 2023-05-03 DIAGNOSIS — J029 Acute pharyngitis, unspecified: Secondary | ICD-10-CM | POA: Diagnosis not present

## 2023-05-03 LAB — POCT INFLUENZA A/B
Influenza A, POC: NEGATIVE
Influenza B, POC: POSITIVE — AB

## 2023-05-03 LAB — POCT RAPID STREP A (OFFICE): Rapid Strep A Screen: NEGATIVE

## 2023-05-03 MED ORDER — PROPRANOLOL HCL ER 60 MG PO CP24: 60.0000 mg | ORAL_CAPSULE | Freq: Every day | ORAL | 3 refills | Status: DC

## 2023-05-03 MED ORDER — OSELTAMIVIR PHOSPHATE 75 MG PO CAPS: 75.0000 mg | ORAL_CAPSULE | Freq: Two times a day (BID) | ORAL | 0 refills | Status: DC

## 2023-05-03 MED ORDER — DULOXETINE HCL 30 MG PO CPEP: 30.0000 mg | ORAL_CAPSULE | Freq: Every day | ORAL | 3 refills | Status: AC

## 2023-05-03 NOTE — Assessment & Plan Note (Signed)
 Well controlled. Continue current regimen. Follow up in  19mo due for labs

## 2023-05-03 NOTE — Assessment & Plan Note (Addendum)
 Always with Dr. Winifred Haven currently prescribes her hydrocodone and gabapentin .

## 2023-05-03 NOTE — Assessment & Plan Note (Signed)
To recheck lipid panel.

## 2023-05-03 NOTE — Assessment & Plan Note (Signed)
 -  Continue propranolol

## 2023-05-03 NOTE — Assessment & Plan Note (Signed)
 Migraines are well-controlled with Topamax  and as needed Imitrex .  She also has Compazine as needed for nausea.

## 2023-05-03 NOTE — Assessment & Plan Note (Signed)
 Currently on Cymbalta  in part for pain control and regulation as well as mood.

## 2023-05-03 NOTE — Assessment & Plan Note (Signed)
 Continue Cymbalta.

## 2023-05-03 NOTE — Assessment & Plan Note (Signed)
 To recheck renal function will monitor carefully.

## 2023-05-03 NOTE — Progress Notes (Signed)
 Established Patient Office Visit  Subjective  Patient ID: Carol Diaz, female    DOB: 04-12-1965  Age: 58 y.o. MRN: [id number]  Chief Complaint  Patient presents with   Medical Management of Chronic Issues    HPI  Here for 6 mo f/u today.    Has been having some left hip pain radiating into her thigh towards knee.  Painful to sleep on that hip and with walking. Pain has been more constant.    Pos exp to flu. Now has ST that started yesterday and chills.  Would like to be tested.  Headaches overall are much better.  Brain MRI most a year ago in February 2024 showed interval resolution of intracranial hypotension.  And no other abnormal findings at the time.    ROS    Objective:     BP 115/89   Pulse 83   Temp 98.7 F (37.1 C) (Oral)   Ht 5' 6 (1.676 m)   Wt 145 lb (65.8 kg)   LMP 04/15/2018   SpO2 100%   BMI 23.40 kg/m    Physical Exam Vitals and nursing note reviewed.  Constitutional:      Appearance: Normal appearance.  HENT:     Head: Normocephalic and atraumatic.     Right Ear: Tympanic membrane, ear canal and external ear normal. There is no impacted cerumen.     Left Ear: Tympanic membrane, ear canal and external ear normal. There is no impacted cerumen.     Nose: Nose normal.     Mouth/Throat:     Pharynx: Oropharynx is clear.  Eyes:     Conjunctiva/sclera: Conjunctivae normal.  Cardiovascular:     Rate and Rhythm: Normal rate and regular rhythm.  Pulmonary:     Effort: Pulmonary effort is normal.     Breath sounds: Normal breath sounds.  Musculoskeletal:     Cervical back: Neck supple. No tenderness.     Comments: Tender over L greater trocanter. NROM of the left hip. Non tender over the SI joint.  Normal flexion and extension.    Lymphadenopathy:     Cervical: No cervical adenopathy.  Skin:    General: Skin is warm and dry.  Neurological:     Mental Status: She is alert and oriented to person, place, and time.  Psychiatric:         Mood and Affect: Mood normal.      Results for orders placed or performed in visit on 05/03/23  POCT Influenza A/B  Result Value Ref Range   Influenza A, POC Negative Negative   Influenza B, POC Positive (A) Negative  POCT rapid strep A  Result Value Ref Range   Rapid Strep A Screen Negative Negative      The 10-year ASCVD risk score (Arnett DK, et al., 2019) is: 2.2%    Assessment & Plan:   Problem List Items Addressed This Visit       Cardiovascular and Mediastinum   Migraine headache - Primary   Migraines are well-controlled with Topamax  and as needed Imitrex .  She also has Compazine as needed for nausea.      Relevant Medications   DULoxetine  (CYMBALTA ) 30 MG capsule   propranolol  ER (INDERAL  LA) 60 MG 24 hr capsule   Hypertension   Well controlled. Continue current regimen. Follow up in  29mo due for labs.       Relevant Medications   propranolol  ER (INDERAL  LA) 60 MG 24 hr capsule   Other Relevant  Orders   CMP14+EGFR   Lipid panel   CBC     Nervous and Auditory   Essential tremor   Continue propranolol .        Genitourinary   CKD (chronic kidney disease) stage 2, GFR 60-89 ml/min   To recheck renal function will monitor carefully.        Other   Pain management   Always with Dr. Minetta currently prescribes her hydrocodone and gabapentin .      Major depressive disorder, recurrent episode (HCC)   Currently on Cymbalta  in part for pain control and regulation as well as mood.      Relevant Medications   DULoxetine  (CYMBALTA ) 30 MG capsule   Hyperlipidemia   To recheck lipid panel.      Relevant Medications   propranolol  ER (INDERAL  LA) 60 MG 24 hr capsule   Fibromyalgia   Continue Cymbalta .        Relevant Medications   DULoxetine  (CYMBALTA ) 30 MG capsule   Other Visit Diagnoses       Sore throat       Relevant Orders   POCT Influenza A/B (Completed)   POCT rapid strep A (Completed)     Screening mammogram for breast cancer        Relevant Orders   MM 3D SCREENING MAMMOGRAM BILATERAL BREAST     Trochanteric bursitis of left hip         Left hamstring injury, initial encounter           Flu B-we will go ahead and treat with Tamiflu .  Call if not proving over the next week.  Return in about 6 months (around 10/31/2023) for BP.    Dorothyann Byars, MD

## 2023-05-04 ENCOUNTER — Encounter: Payer: Self-pay | Admitting: Family Medicine

## 2023-05-04 LAB — CBC
Hematocrit: 38.4 % (ref 34.0–46.6)
Hemoglobin: 12.7 g/dL (ref 11.1–15.9)
MCH: 29.5 pg (ref 26.6–33.0)
MCHC: 33.1 g/dL (ref 31.5–35.7)
MCV: 89 fL (ref 79–97)
Platelets: 229 10*3/uL (ref 150–450)
RBC: 4.31 x10E6/uL (ref 3.77–5.28)
RDW: 13.7 % (ref 11.7–15.4)
WBC: 4.7 10*3/uL (ref 3.4–10.8)

## 2023-05-04 LAB — CMP14+EGFR
ALT: 15 [IU]/L (ref 0–32)
AST: 21 [IU]/L (ref 0–40)
Albumin: 4 g/dL (ref 3.8–4.9)
Alkaline Phosphatase: 124 [IU]/L — ABNORMAL HIGH (ref 44–121)
BUN/Creatinine Ratio: 12 (ref 9–23)
BUN: 12 mg/dL (ref 6–24)
Bilirubin Total: 0.3 mg/dL (ref 0.0–1.2)
CO2: 21 mmol/L (ref 20–29)
Calcium: 9.6 mg/dL (ref 8.7–10.2)
Chloride: 109 mmol/L — ABNORMAL HIGH (ref 96–106)
Creatinine, Ser: 1 mg/dL (ref 0.57–1.00)
Globulin, Total: 2.2 g/dL (ref 1.5–4.5)
Glucose: 74 mg/dL (ref 70–99)
Potassium: 4.2 mmol/L (ref 3.5–5.2)
Sodium: 142 mmol/L (ref 134–144)
Total Protein: 6.2 g/dL (ref 6.0–8.5)
eGFR: 66 mL/min/{1.73_m2} (ref >59)

## 2023-05-04 LAB — LIPID PANEL
Chol/HDL Ratio: 3.8 {ratio} (ref 0.0–4.4)
Cholesterol, Total: 224 mg/dL — ABNORMAL HIGH (ref 100–199)
HDL: 59 mg/dL (ref >39)
LDL Chol Calc (NIH): 142 mg/dL — ABNORMAL HIGH (ref 0–99)
Triglycerides: 129 mg/dL (ref 0–149)
VLDL Cholesterol Cal: 23 mg/dL (ref 5–40)

## 2023-05-04 NOTE — Progress Notes (Signed)
 Hi Carol Diaz, overall metabolic panel looks good.  The alkaline phosphatase which is a type of liver enzyme was just slightly elevated by couple of points but the additional liver enzymes look normal.  I doing to keep an eye on this and plan to recheck the alkaline phosphatase in about 6 weeks.  Total cholesterol and LDL are mildly elevated continue to work on healthy diet and regular exercise.  Blood count is normal no sign of anemia.  Please let us  know when you had your last Pap smear.  [name] may have asked you this the other day I am not sure.

## 2023-05-14 ENCOUNTER — Encounter: Payer: Self-pay | Admitting: Family Medicine

## 2023-05-14 ENCOUNTER — Telehealth: Payer: 59 | Admitting: Physician Assistant

## 2023-05-14 DIAGNOSIS — J329 Chronic sinusitis, unspecified: Secondary | ICD-10-CM | POA: Diagnosis not present

## 2023-05-14 MED ORDER — AMOXICILLIN-POT CLAVULANATE 875-125 MG PO TABS: 1.0000 | ORAL_TABLET | Freq: Two times a day (BID) | ORAL | 0 refills | Status: AC

## 2023-05-14 NOTE — Progress Notes (Signed)

## 2023-05-14 NOTE — Progress Notes (Signed)
 I have spent 5 minutes in review of e-visit questionnaire, review and updating patient chart, medical decision making and response to patient.   Laure Kidney, PA-C

## 2023-06-15 ENCOUNTER — Ambulatory Visit (INDEPENDENT_AMBULATORY_CARE_PROVIDER_SITE_OTHER): Payer: Self-pay

## 2023-06-15 ENCOUNTER — Ambulatory Visit

## 2023-06-15 ENCOUNTER — Ambulatory Visit (INDEPENDENT_AMBULATORY_CARE_PROVIDER_SITE_OTHER): Admitting: Sports Medicine

## 2023-06-15 DIAGNOSIS — M25552 Pain in left hip: Secondary | ICD-10-CM

## 2023-06-15 DIAGNOSIS — M1612 Unilateral primary osteoarthritis, left hip: Secondary | ICD-10-CM | POA: Diagnosis not present

## 2023-06-15 MED ORDER — TRIAMCINOLONE ACETONIDE 40 MG/ML IJ SUSP
40.0000 mg | Freq: Once | INTRAMUSCULAR | Status: AC
Start: 2023-06-15 — End: 2023-06-15
  Administered 2023-06-15: 40 mg via INTRAMUSCULAR

## 2023-06-15 NOTE — Progress Notes (Signed)
    Procedures performed today:    Procedure: Real-time Ultrasound Guided injection of the left gluteus medius and minimus tendon sheath, greater trochanteric bursa Device: Samsung HS60  Verbal informed consent obtained.  Time-out conducted.  Noted no overlying erythema, induration, or other signs of local infection.  Skin prepped in a sterile fashion.  Local anesthesia: Topical Ethyl chloride.  With sterile technique and under real time ultrasound guidance: Noted a mild to moderately distended trochanteric bursa, 1 cc Kenalog 40, 2 cc lidocaine, 2 cc bupivacaine injected easily Completed without difficulty  Advised to call if fevers/chills, erythema, induration, drainage, or persistent bleeding.  Images permanently stored and available for review in PACS.  Impression: Technically successful ultrasound guided injection.  Independent interpretation of notes and tests performed by another provider:   None.  Brief History, Exam, Impression, and Recommendations:    Left hip pain This is a 58 year old female, she does have chronic pain, she is well-known to me with regards to her lumbar spine, she has some disc disease but no areas of neuroforaminal stenosis, she was however having radicular type symptoms which perplexed me. We asked her to touch base with Dr. Estella Husk her neurologist, it sounds like he has done some epidurals the last of which was about a month and a half ago, this is done well to improve her back pain. Unfortunately she has started to have discomfort that she localizes laterally over the greater trochanter. On exam she has severe tenderness at the greater trochanter, profound weakness to hip abduction, no tenderness at the SI joint, paralumbar musculature or ischial tuberosity. Suspect a hip abductor tendinopathy historically known as trochanteric bursitis. Due to severity of pain we will do a hip abductor as well as trochanteric bursa injection today with ultrasound  guidance, she understands the need for aggressive physical therapy. X-rays today, return in 6 weeks. I expect her hip strength to be a lot better at the follow-up.    ____________________________________________ Ihor Austin. Benjamin Stain, M.D., ABFM., CAQSM., AME. Primary Care and Sports Medicine [facility]  Adjunct Professor of Family Medicine  [employer]  Restaurant manager, fast food

## 2023-06-15 NOTE — Assessment & Plan Note (Signed)
 This is a 58 year old female, she does have chronic pain, she is well-known to me with regards to her lumbar spine, she has some disc disease but no areas of neuroforaminal stenosis, she was however having radicular type symptoms which perplexed me. We asked her to touch base with Dr. Estella Husk her neurologist, it sounds like he has done some epidurals the last of which was about a month and a half ago, this is done well to improve her back pain. Unfortunately she has started to have discomfort that she localizes laterally over the greater trochanter. On exam she has severe tenderness at the greater trochanter, profound weakness to hip abduction, no tenderness at the SI joint, paralumbar musculature or ischial tuberosity. Suspect a hip abductor tendinopathy historically known as trochanteric bursitis. Due to severity of pain we will do a hip abductor as well as trochanteric bursa injection today with ultrasound guidance, she understands the need for aggressive physical therapy. X-rays today, return in 6 weeks. I expect her hip strength to be a lot better at the follow-up.

## 2023-06-15 NOTE — Addendum Note (Signed)
 Addended by: Samule Dry on: 06/15/2023 03:14 PM   Modules accepted: Orders

## 2023-06-22 ENCOUNTER — Encounter: Payer: Self-pay | Admitting: Sports Medicine

## 2023-06-27 ENCOUNTER — Ambulatory Visit

## 2023-06-27 ENCOUNTER — Ambulatory Visit: Payer: 59

## 2023-07-03 ENCOUNTER — Ambulatory Visit

## 2023-07-27 ENCOUNTER — Ambulatory Visit: Admitting: Family Medicine

## 2023-10-31 ENCOUNTER — Ambulatory Visit: Payer: 59 | Admitting: Family Medicine

## 2023-11-27 ENCOUNTER — Encounter: Payer: Self-pay | Admitting: Sports Medicine

## 2023-12-10 ENCOUNTER — Ambulatory Visit: Admitting: Family Medicine

## 2023-12-13 ENCOUNTER — Telehealth: Admitting: Physician Assistant

## 2023-12-13 DIAGNOSIS — R0689 Other abnormalities of breathing: Secondary | ICD-10-CM

## 2023-12-13 NOTE — Progress Notes (Signed)
  Because of the duration of your sympoms, I feel your condition warrants further evaluation and I recommend that you be seen in a face-to-face visit.   NOTE: There will be NO CHARGE for this E-Visit   If you are having a true medical emergency, please call 911.     For an urgent face to face visit, [facility] has multiple urgent care centers for your convenience.  Click the link below for the full list of locations and hours, walk-in wait times, appointment scheduling options and driving directions:  Urgent Care - [place], [place], [place], [place], [place], [place]  [place]     Your MyChart E-visit questionnaire answers were reviewed by a board certified advanced clinical practitioner to complete your personal care plan based on your specific symptoms.    Thank you for using e-Visits.

## 2023-12-20 ENCOUNTER — Encounter: Payer: Self-pay | Admitting: Family Medicine

## 2023-12-20 ENCOUNTER — Telehealth (INDEPENDENT_AMBULATORY_CARE_PROVIDER_SITE_OTHER): Admitting: Family Medicine

## 2023-12-20 DIAGNOSIS — J0100 Acute maxillary sinusitis, unspecified: Secondary | ICD-10-CM | POA: Diagnosis not present

## 2023-12-20 MED ORDER — AMOXICILLIN 875 MG PO TABS: 875.0000 mg | ORAL_TABLET | Freq: Two times a day (BID) | ORAL | 0 refills | Status: DC

## 2023-12-20 NOTE — Progress Notes (Signed)
 Pt reports that this started about 6-7 days ago. She has been taking Tylenol  cold.  She denies any f/s/c/n/v/d. She hasn't taken a flu or covid test.  She reports pain pressure in face.

## 2023-12-20 NOTE — Progress Notes (Signed)
   Acute Office Visit  Subjective:     Patient ID: DANAYE SOBH, female    DOB: 1965/11/14, 58 y.o.   MRN: [id number]  No chief complaint on file.   HPI Patient is in today for sinus sxs. Started about 7 days ago with runny nose and ST. Drainage is not as bad but now super congested. Having maxillary facial pressure and ear pressure. No fever or chills. Using Tylenol  Cold.     ROS      Objective:    LMP 04/15/2018    Physical Exam Constitutional:      Appearance: Normal appearance.  Neurological:     Mental Status: She is alert.     No results found for any visits on 12/20/23.      Assessment & Plan:   Problem List Items Addressed This Visit   None Visit Diagnoses       Acute non-recurrent maxillary sinusitis    -  Primary   Relevant Medications   amoxicillin  (AMOXIL ) 875 MG tablet      Acute sinusitis-we will go ahead and treat with amoxicillin  since she feels like she is actually getting worse.  If not better in 1 week please let us  know also recommend adding in nasal saline irrigation for symptom control.  Meds ordered this encounter  Medications   amoxicillin  (AMOXIL ) 875 MG tablet    Sig: Take 1 tablet (875 mg total) by mouth 2 (two) times daily.    Dispense:  14 tablet    Refill:  0    No follow-ups on file.  Dorothyann Byars, MD

## 2024-01-07 ENCOUNTER — Other Ambulatory Visit: Payer: Self-pay | Admitting: Obstetrics and Gynecology

## 2024-01-07 ENCOUNTER — Telehealth: Payer: Self-pay | Admitting: Obstetrics and Gynecology

## 2024-01-08 NOTE — Telephone Encounter (Signed)
 Needs annual exam and discussion of bone treatment Dr. Glennon

## 2024-01-24 NOTE — Telephone Encounter (Signed)
 Message sent to appointment desk to schedule patient.   Encounter closed.

## 2024-02-11 ENCOUNTER — Telehealth: Admitting: Physician Assistant

## 2024-02-11 DIAGNOSIS — B9689 Other specified bacterial agents as the cause of diseases classified elsewhere: Secondary | ICD-10-CM

## 2024-02-11 DIAGNOSIS — J019 Acute sinusitis, unspecified: Secondary | ICD-10-CM

## 2024-02-11 MED ORDER — FLUTICASONE PROPIONATE 50 MCG/ACT NA SUSP: 2.0000 | Freq: Every day | NASAL | 0 refills | Status: AC

## 2024-02-11 MED ORDER — AMOXICILLIN-POT CLAVULANATE 875-125 MG PO TABS: 1.0000 | ORAL_TABLET | Freq: Two times a day (BID) | ORAL | 0 refills | Status: AC

## 2024-02-11 NOTE — Progress Notes (Signed)

## 2024-03-31 ENCOUNTER — Other Ambulatory Visit: Payer: Self-pay | Admitting: Family Medicine
